# Patient Record
Sex: Male | Born: 1940 | Race: White | Hispanic: No | Marital: Married | Smoking: Former smoker
Health system: Southern US, Community
[De-identification: ages and names within clinical notes are randomized; demographics above are authoritative.]

## PROBLEM LIST (undated history)

## (undated) DIAGNOSIS — I4891 Unspecified atrial fibrillation: Secondary | ICD-10-CM

## (undated) DIAGNOSIS — I10 Essential (primary) hypertension: Secondary | ICD-10-CM

## (undated) DIAGNOSIS — I2699 Other pulmonary embolism without acute cor pulmonale: Secondary | ICD-10-CM

## (undated) DIAGNOSIS — E785 Hyperlipidemia, unspecified: Secondary | ICD-10-CM

## (undated) DIAGNOSIS — G4733 Obstructive sleep apnea (adult) (pediatric): Secondary | ICD-10-CM

## (undated) HISTORY — PX: VASECTOMY: SHX75

## (undated) HISTORY — DX: Hyperlipidemia, unspecified: E78.5

## (undated) HISTORY — DX: Morbid (severe) obesity due to excess calories: E66.01

## (undated) HISTORY — DX: Obstructive sleep apnea (adult) (pediatric): G47.33

## (undated) HISTORY — DX: Unspecified atrial fibrillation: I48.91

## (undated) HISTORY — DX: Essential (primary) hypertension: I10

---

## 2005-04-14 ENCOUNTER — Encounter: Admission: RE | Admit: 2005-04-14 | Discharge: 2005-04-14 | Payer: Self-pay | Admitting: Family Medicine

## 2009-01-17 ENCOUNTER — Emergency Department (HOSPITAL_COMMUNITY): Admission: EM | Admit: 2009-01-17 | Discharge: 2009-01-17 | Payer: Self-pay | Admitting: Family Medicine

## 2009-02-12 HISTORY — PX: US ECHOCARDIOGRAPHY: HXRAD669

## 2010-01-24 ENCOUNTER — Ambulatory Visit: Payer: Self-pay | Admitting: Cardiology

## 2010-07-27 ENCOUNTER — Encounter: Payer: Self-pay | Admitting: Cardiology

## 2010-08-03 ENCOUNTER — Telehealth: Payer: Self-pay | Admitting: *Deleted

## 2010-08-03 ENCOUNTER — Ambulatory Visit (INDEPENDENT_AMBULATORY_CARE_PROVIDER_SITE_OTHER): Payer: Medicare Other | Admitting: Cardiology

## 2010-08-03 ENCOUNTER — Encounter: Payer: Self-pay | Admitting: Cardiology

## 2010-08-03 VITALS — BP 118/76 | HR 93 | Ht 69.0 in | Wt 390.6 lb

## 2010-08-03 DIAGNOSIS — I10 Essential (primary) hypertension: Secondary | ICD-10-CM

## 2010-08-03 DIAGNOSIS — I4891 Unspecified atrial fibrillation: Secondary | ICD-10-CM

## 2010-08-03 DIAGNOSIS — E785 Hyperlipidemia, unspecified: Secondary | ICD-10-CM

## 2010-08-03 DIAGNOSIS — E119 Type 2 diabetes mellitus without complications: Secondary | ICD-10-CM

## 2010-08-03 MED ORDER — RIVAROXABAN 20 MG PO TABS: 20.0000 mg | ORAL_TABLET | Freq: Every day | ORAL | Status: DC

## 2010-08-03 NOTE — Telephone Encounter (Signed)
Called [facility] for prior auth for Xarelto at [id number]. Spoke w/ Chrisi, she approved and was sent to Pharm., called HT and told to resubmit.

## 2010-08-03 NOTE — Progress Notes (Signed)
   John Paul Date of Birth: 1940/12/14   History of Present Illness: John Paul is seen today for followup. He elected not to further consider bariatric surgery. He has restricted his carbohydrate intake and has lost 25 pounds since his last visit. He is feeling well. He states that his dyspnea is about the same. He has had no significant lower extremity edema. He denies any palpitations or dizziness. He has had no chest pain.  Current Outpatient Prescriptions on File Prior to Visit  Medication Sig Dispense Refill  . aspirin 81 MG tablet Take 81 mg by mouth daily.        Marland Kitchen atenolol (TENORMIN) 50 MG tablet Take 50 mg by mouth 2 (two) times daily.        . fish oil-omega-3 fatty acids 1000 MG capsule Take 1 g by mouth daily.        Marland Kitchen glyBURIDE (DIABETA) 2.5 MG tablet Take 2.5 mg by mouth daily with breakfast.        . glyBURIDE-metformin (GLUCOVANCE) 2.5-500 MG per tablet Take 1 tablet by mouth 2 (two) times daily with a meal.        . Ibuprofen (ADVIL PO) Take by mouth as needed.        . Multiple Vitamin (MULTIVITAMIN) tablet Take 1 tablet by mouth daily.        . quinapril (ACCUPRIL) 10 MG tablet Take 20 mg by mouth at bedtime.        . simvastatin (ZOCOR) 20 MG tablet Take 20 mg by mouth at bedtime.        . triamterene-hydrochlorothiazide (DYAZIDE) 37.5-25 MG per capsule Take 1 capsule by mouth every morning.          No Known Allergies  Past Medical History  Diagnosis Date  . Diabetes mellitus   . Hypertension   . Hyperlipidemia   . Morbid obesity     WITH HYPOVENTILATION  . OSA (obstructive sleep apnea)   . Atrial fibrillation     Past Surgical History  Procedure Date  . Vasectomy   . US echocardiography 02/12/2009    EF 55-60%    History  Smoking status  . Former Smoker  . Quit date: 07/27/1970  Smokeless tobacco  . Not on file    History  Alcohol Use No    Family History  Problem Relation Age of Onset  . Clotting disorder Father     Review of  Systems: The review of systems is positive for chronic sleep apnea.  All other systems were reviewed and are negative.  Physical Exam: BP 118/76  Pulse 93  Ht 5\' 9"  (1.753 m)  Wt 390 lb 9.6 oz (177.175 kg)  BMI 57.68 kg/m2  SpO2 95% He is an obese white male in no acute distress. He is normocephalic, atraumatic. Pupils are equal round and reactive to light and accommodation. Extraocular movements are full. Oropharynx is clear. He has no JVD or bruits. Lungs are clear. Cardiac exam reveals an irregular rate and rhythm without gallop or murmur. Abdomen is morbidly obese with a large pannus. He has no edema. Pedal pulses are palpable. Skin is warm and dry. He does walk with a cane. LABORATORY DATA: ECG today demonstrates atrial fibrillation with a rate of 98 beats per minute. There is low voltage and left axis deviation. He has poor R-wave progression.  Assessment / Plan:

## 2010-08-03 NOTE — Assessment & Plan Note (Signed)
He has recurrent atrial fibrillation. His rate is controlled on atenolol. He is asymptomatic. He had a prior episode of atrial fibrillation in 2010 that then resolved. He has a [place] score of at least 2. I have recommended long-term anticoagulation. We discussed the current options and have elected to start him on Xarelto 20 mg daily. If this is too expensive we could put him on Coumadin which he was on previously. I recommended that he stop taking aspirin daily. He is going to monitor his pulse rate at home.

## 2010-08-03 NOTE — Assessment & Plan Note (Signed)
I am pleased with his weight loss. I encouraged him to continue with his weight loss efforts.

## 2010-08-03 NOTE — Assessment & Plan Note (Signed)
Blood pressure is well controlled on his current medical regimen.

## 2010-08-03 NOTE — Patient Instructions (Signed)
We will add Xarelto 20 mg daily for anticoagulation.  Stop ASA.  Continue your other medications.  I will see you back again in 6 months.

## 2010-08-04 ENCOUNTER — Encounter: Payer: Self-pay | Admitting: Cardiology

## 2010-08-24 ENCOUNTER — Encounter: Payer: Self-pay | Admitting: Cardiology

## 2011-02-08 ENCOUNTER — Ambulatory Visit (INDEPENDENT_AMBULATORY_CARE_PROVIDER_SITE_OTHER): Payer: Medicare Other | Admitting: Cardiology

## 2011-02-08 ENCOUNTER — Encounter: Payer: Self-pay | Admitting: Cardiology

## 2011-02-08 VITALS — BP 126/76 | HR 68 | Ht 70.0 in | Wt 387.0 lb

## 2011-02-08 DIAGNOSIS — Z7901 Long term (current) use of anticoagulants: Secondary | ICD-10-CM

## 2011-02-08 DIAGNOSIS — I10 Essential (primary) hypertension: Secondary | ICD-10-CM

## 2011-02-08 DIAGNOSIS — I4891 Unspecified atrial fibrillation: Secondary | ICD-10-CM

## 2011-02-08 DIAGNOSIS — E785 Hyperlipidemia, unspecified: Secondary | ICD-10-CM

## 2011-02-08 NOTE — Assessment & Plan Note (Signed)
Continue to focus on weight loss with portion control.

## 2011-02-08 NOTE — Progress Notes (Signed)
   [name] Date of Birth: [birth year]   History of Present Illness: John Paul is seen today for followup. He is doing very well. He has lost an additional 4 pounds. He is mainly restricting his portion sizes. He did see his primary care physician last week and had complete blood work. He reports that his sugar and cholesterol levels were very good. He has had some slight dizziness and has been suffering from some sinus congestion. He has no change in his shortness of breath. He does have some intermittent arthralgias in his hands.  Current Outpatient Prescriptions on File Prior to Visit  Medication Sig Dispense Refill  . atenolol (TENORMIN) 50 MG tablet Take 50 mg by mouth 2 (two) times daily.        . fish oil-omega-3 fatty acids 1000 MG capsule Take 1 g by mouth daily.        Marland Kitchen glyBURIDE-metformin (GLUCOVANCE) 2.5-500 MG per tablet Take 1 tablet by mouth 2 (two) times daily with a meal.        . Ibuprofen (ADVIL PO) Take by mouth as needed.        . Multiple Vitamin (MULTIVITAMIN) tablet Take 1 tablet by mouth daily.        . quinapril (ACCUPRIL) 10 MG tablet Take 20 mg by mouth at bedtime.        . Rivaroxaban (XARELTO) 20 MG TABS Take 20 mg by mouth daily.  30 tablet  6  . simvastatin (ZOCOR) 20 MG tablet Take 20 mg by mouth at bedtime.        . traMADol (ULTRAM) 50 MG tablet       . triamterene-hydrochlorothiazide (DYAZIDE) 37.5-25 MG per capsule Take 1 capsule by mouth every morning.          No Known Allergies  Past Medical History  Diagnosis Date  . Diabetes mellitus   . Hypertension   . Hyperlipidemia   . Morbid obesity     WITH HYPOVENTILATION  . OSA (obstructive sleep apnea)   . Atrial fibrillation     Past Surgical History  Procedure Date  . Vasectomy   . US echocardiography 02/12/2009    EF 55-60%    History  Smoking status  . Former Smoker  . Quit date: 07/27/1970  Smokeless tobacco  . Not on file    History  Alcohol Use No    Family History    Problem Relation Age of Onset  . Clotting disorder Father     Review of Systems: The review of systems is positive for chronic sleep apnea.  All other systems were reviewed and are negative.  Physical Exam: BP 126/76  Pulse 48  Ht 5\' 10"  (1.778 m)  Wt 387 lb (175.542 kg)  BMI 55.53 kg/m2 He is an obese white male in no acute distress. He is normocephalic, atraumatic. Pupils are equal round and reactive to light and accommodation. Extraocular movements are full. Oropharynx is clear. He has no JVD or bruits. Lungs are clear. Cardiac exam reveals an irregular rate and rhythm without gallop or murmur. Abdomen is morbidly obese with a large pannus. He has no edema. Pedal pulses are palpable. Skin is warm and dry. He does walk with a cane. LABORATORY DATA:   Assessment / Plan:

## 2011-02-08 NOTE — Assessment & Plan Note (Signed)
Blood pressure is under excellent control. 

## 2011-02-08 NOTE — Patient Instructions (Signed)
Continue your current therapy  I will see you again in 6 months.   

## 2011-02-08 NOTE — Assessment & Plan Note (Signed)
Rate is well controlled and he is asymptomatic. We will continue with atenolol. He is on chronic anticoagulation with Xarelto without any bleeding problems. I will followup again in 6 months.

## 2011-03-03 ENCOUNTER — Other Ambulatory Visit: Payer: Self-pay | Admitting: Cardiology

## 2011-09-03 ENCOUNTER — Other Ambulatory Visit: Payer: Self-pay | Admitting: Cardiology

## 2011-09-07 ENCOUNTER — Ambulatory Visit (INDEPENDENT_AMBULATORY_CARE_PROVIDER_SITE_OTHER): Payer: Medicare Other | Admitting: Cardiology

## 2011-09-07 ENCOUNTER — Encounter: Payer: Self-pay | Admitting: Cardiology

## 2011-09-07 VITALS — BP 122/64 | HR 77 | Ht 70.0 in | Wt 391.8 lb

## 2011-09-07 DIAGNOSIS — I10 Essential (primary) hypertension: Secondary | ICD-10-CM

## 2011-09-07 DIAGNOSIS — I4891 Unspecified atrial fibrillation: Secondary | ICD-10-CM

## 2011-09-07 MED ORDER — RIVAROXABAN 20 MG PO TABS: 20.0000 mg | ORAL_TABLET | Freq: Every day | ORAL | Status: DC

## 2011-09-07 NOTE — Patient Instructions (Signed)
Continue your current medications  I will see you again in 6 months.   

## 2011-09-07 NOTE — Progress Notes (Signed)
   [name] Date of Birth: January 14, 1941   History of Present Illness: John Paul is seen today for followup. He is doing very well. He still struggles with his weight. He reports that his breathing is about the same. He does note some increase in his heart rate when he walks. He is still very sedentary. He reports good control of his blood sugars and blood pressure. His last A1c was 5.5%  Current Outpatient Prescriptions on File Prior to Visit  Medication Sig Dispense Refill  . atenolol (TENORMIN) 50 MG tablet Take 50 mg by mouth 2 (two) times daily.        . fish oil-omega-3 fatty acids 1000 MG capsule Take 1 g by mouth daily.        Marland Kitchen glyBURIDE-metformin (GLUCOVANCE) 2.5-500 MG per tablet Take 1 tablet by mouth 2 (two) times daily with a meal.        . Multiple Vitamin (MULTIVITAMIN) tablet Take 1 tablet by mouth daily.        . quinapril (ACCUPRIL) 10 MG tablet Take 20 mg by mouth at bedtime.        . simvastatin (ZOCOR) 20 MG tablet Take 20 mg by mouth at bedtime.        . traMADol (ULTRAM) 50 MG tablet       . triamterene-hydrochlorothiazide (DYAZIDE) 37.5-25 MG per capsule Take 1 capsule by mouth every morning.        Marland Kitchen DISCONTD: XARELTO 20 MG TABS TAKE 1 TABLET BY MOUTH DAILY  30 tablet  4    No Known Allergies  Past Medical History  Diagnosis Date  . Diabetes mellitus   . Hypertension   . Hyperlipidemia   . Morbid obesity     WITH HYPOVENTILATION  . OSA (obstructive sleep apnea)   . Atrial fibrillation     Past Surgical History  Procedure Date  . Vasectomy   . US echocardiography 02/12/2009    EF 55-60%    History  Smoking status  . Former Smoker  . Quit date: 07/27/1970  Smokeless tobacco  . Not on file    History  Alcohol Use No    Family History  Problem Relation Age of Onset  . Clotting disorder Father     Review of Systems: The review of systems is positive for chronic sleep apnea.  All other systems were reviewed and are negative.  Physical  Exam: BP 122/64  Pulse 77  Ht 5\' 10"  (1.778 m)  Wt 391 lb 12.8 oz (177.719 kg)  BMI 56.22 kg/m2 He is an obese white male in no acute distress. He is normocephalic, atraumatic. Pupils are equal round and reactive to light and accommodation. Extraocular movements are full. Oropharynx is clear. He has no JVD or bruits. Lungs are clear. Cardiac exam reveals an irregular rate and rhythm without gallop or murmur. Abdomen is morbidly obese with a large pannus. He has no edema. Pedal pulses are palpable. Skin is warm and dry. He does walk with a cane.  LABORATORY DATA:   Assessment / Plan: 1. Atrial fibrillation, permanent. Rate appears to be well controlled. He is on anticoagulation with Xarelto. We will continue on his current therapy.  2. Hypertension, controlled.  3. Morbid obesity. Continue to encourage weight loss.

## 2012-02-15 ENCOUNTER — Other Ambulatory Visit: Payer: Self-pay | Admitting: Cardiology

## 2012-02-15 NOTE — Telephone Encounter (Signed)
Already refilled up to August this year (2014)

## 2012-09-24 ENCOUNTER — Other Ambulatory Visit: Payer: Self-pay | Admitting: Cardiology

## 2012-12-18 ENCOUNTER — Ambulatory Visit (INDEPENDENT_AMBULATORY_CARE_PROVIDER_SITE_OTHER): Payer: Medicare Other | Admitting: Cardiology

## 2012-12-18 ENCOUNTER — Encounter: Payer: Self-pay | Admitting: Cardiology

## 2012-12-18 VITALS — BP 150/76 | HR 83 | Ht 70.0 in | Wt 367.4 lb

## 2012-12-18 DIAGNOSIS — I10 Essential (primary) hypertension: Secondary | ICD-10-CM

## 2012-12-18 DIAGNOSIS — I4891 Unspecified atrial fibrillation: Secondary | ICD-10-CM

## 2012-12-18 DIAGNOSIS — E785 Hyperlipidemia, unspecified: Secondary | ICD-10-CM

## 2012-12-18 DIAGNOSIS — E119 Type 2 diabetes mellitus without complications: Secondary | ICD-10-CM

## 2012-12-18 NOTE — Patient Instructions (Signed)
Continue your current therapy  I will see you in one year   

## 2012-12-18 NOTE — Progress Notes (Signed)
   John Paul Date of Birth: May 12, 1940   History of Present Illness: John Paul is seen today for yearly followup. He has a history of morbid obesity, permanent atrial fibrillation, and hypertension. He is doing very well. He has lost 24 pounds since his last visit here. He states his blood pressure readings and blood sugars at home have been better. He denies any significant palpitations, dizziness, chest pain, or shortness of breath.  Current Outpatient Prescriptions on File Prior to Visit  Medication Sig Dispense Refill  . atenolol (TENORMIN) 50 MG tablet Take 50 mg by mouth 2 (two) times daily.        Marland Kitchen glyBURIDE-metformin (GLUCOVANCE) 2.5-500 MG per tablet Take 1 tablet by mouth 2 (two) times daily with a meal.        . Multiple Vitamin (MULTIVITAMIN) tablet Take 1 tablet by mouth daily.        . quinapril (ACCUPRIL) 10 MG tablet Take 20 mg by mouth at bedtime.        . simvastatin (ZOCOR) 20 MG tablet Take 20 mg by mouth at bedtime.        . traMADol (ULTRAM) 50 MG tablet       . triamterene-hydrochlorothiazide (DYAZIDE) 37.5-25 MG per capsule Take 1 capsule by mouth every morning.        [name] 20 MG TABS tablet TAKE ONE TABLET EACH DAY  30 tablet  3   No current facility-administered medications on file prior to visit.    No Known Allergies  Past Medical History  Diagnosis Date  . Diabetes mellitus   . Hypertension   . Hyperlipidemia   . Morbid obesity     WITH HYPOVENTILATION  . OSA (obstructive sleep apnea)   . Atrial fibrillation     Past Surgical History  Procedure Laterality Date  . Vasectomy    . US echocardiography  02/12/2009    EF 55-60%    History  Smoking status  . Former Smoker  . Quit date: 07/27/1970  Smokeless tobacco  . Not on file    History  Alcohol Use No    Family History  Problem Relation Age of Onset  . Clotting disorder Father     Review of Systems: The review of systems is positive for chronic sleep apnea.  All other systems  were reviewed and are negative.  Physical Exam: BP 150/76  Pulse 83  Ht 5\' 10"  (1.778 m)  Wt 367 lb 6.4 oz (166.652 kg)  BMI 52.72 kg/m2 He is an obese white male in no acute distress. HEENT exam is unremarkable. He has no JVD or bruits. Lungs are clear. Cardiac exam reveals an irregular rate and rhythm without gallop or murmur. Abdomen is morbidly obese with a large pannus. He has no edema. Pedal pulses are palpable. Skin is warm and dry. He does walk with a cane.  LABORATORY DATA: ECG today demonstrates atrial fibrillation with a rate of 83 beats per minute. Occasional PVCs. There is low voltage and poor R wave progression in the precordial leads.  Assessment / Plan: 1. Atrial fibrillation, permanent. Rate appears to be well controlled. He is on anticoagulation with Xarelto. We will continue on his current therapy.  2. Hypertension, controlled.  3. Morbid obesity. Continue to encourage weight loss.

## 2013-04-17 ENCOUNTER — Other Ambulatory Visit: Payer: Self-pay

## 2013-04-17 MED ORDER — RIVAROXABAN 20 MG PO TABS: ORAL_TABLET | ORAL | Status: DC

## 2013-08-09 ENCOUNTER — Other Ambulatory Visit: Payer: Self-pay | Admitting: Cardiology

## 2013-08-11 NOTE — Telephone Encounter (Signed)
Peter M SwazilandJordan, MD at 12/18/2012  6:13 PM  XARELTO 20 MG TABS tablet  TAKE ONE TABLET EACH DAY   30 tablet   3

## 2013-09-19 ENCOUNTER — Telehealth: Payer: Self-pay | Admitting: Cardiology

## 2013-09-19 ENCOUNTER — Telehealth: Payer: Self-pay | Admitting: *Deleted

## 2013-09-19 NOTE — Telephone Encounter (Signed)
Xarelto samples placed at the front desk for pick up. 

## 2013-09-19 NOTE — Telephone Encounter (Signed)
John Paul is calling because John Paul is in the donut hole and they cannot afford the medication, was given on 25 pills . Please call   Thanks

## 2013-09-19 NOTE — Telephone Encounter (Signed)
Spoke with EC and let her know we do not have any Xarelto at this time. She did get 25pills from [address] office this A.M.

## 2013-10-29 ENCOUNTER — Telehealth: Payer: Self-pay | Admitting: Cardiology

## 2013-10-29 MED ORDER — RIVAROXABAN 20 MG PO TABS: 20.0000 mg | ORAL_TABLET | Freq: Every day | ORAL | Status: DC

## 2013-10-29 NOTE — Telephone Encounter (Signed)
Pt would like some samples of Xarelto 20 mg please. °

## 2013-10-29 NOTE — Telephone Encounter (Signed)
Notified patient's wife that samples are at front desk for pick up

## 2013-12-01 ENCOUNTER — Telehealth: Payer: Self-pay | Admitting: Cardiology

## 2013-12-01 MED ORDER — RIVAROXABAN 20 MG PO TABS: 20.0000 mg | ORAL_TABLET | Freq: Every day | ORAL | Status: DC

## 2013-12-01 NOTE — Telephone Encounter (Signed)
Spoke with linda, aware samples at the front desk for pick up

## 2013-12-01 NOTE — Telephone Encounter (Signed)
Mrs. John Paul is asking if [name] can get some samples of Zestrial because he is donut hole . Please call   Thanks

## 2013-12-30 ENCOUNTER — Telehealth: Payer: Self-pay | Admitting: Cardiology

## 2013-12-30 NOTE — Telephone Encounter (Signed)
Returned call to patient spoke to wife xarelto 20 mg samples left at front desk of [facility] office.Advised to keep appointment with Dr.Jordan 01/13/14 at 2:15 pm.

## 2013-12-30 NOTE — Telephone Encounter (Signed)
Pt would like some samples of Xarelto please. °

## 2014-01-13 ENCOUNTER — Ambulatory Visit (INDEPENDENT_AMBULATORY_CARE_PROVIDER_SITE_OTHER): Payer: Medicare HMO | Admitting: Cardiology

## 2014-01-13 ENCOUNTER — Encounter: Payer: Self-pay | Admitting: Cardiology

## 2014-01-13 VITALS — BP 119/73 | HR 73 | Ht 70.0 in | Wt 316.3 lb

## 2014-01-13 DIAGNOSIS — I10 Essential (primary) hypertension: Secondary | ICD-10-CM

## 2014-01-13 DIAGNOSIS — I480 Paroxysmal atrial fibrillation: Secondary | ICD-10-CM

## 2014-01-13 DIAGNOSIS — E785 Hyperlipidemia, unspecified: Secondary | ICD-10-CM

## 2014-01-13 MED ORDER — RIVAROXABAN 20 MG PO TABS: 20.0000 mg | ORAL_TABLET | Freq: Every day | ORAL | Status: AC

## 2014-01-13 NOTE — Patient Instructions (Signed)
Continue your current therapy  I will see you in one year   

## 2014-01-13 NOTE — Progress Notes (Signed)
   John Paul Date of Birth: 1940/03/13   History of Present Illness: John Paul is seen today for yearly followup. He has a history of morbid obesity,  atrial fibrillation, and hypertension. He is doing very well. He has lost an additional 51  pounds over the past year. He states his blood pressure readings and blood sugars at home have been better. Last A1c was 6. He denies any significant palpitations, dizziness, chest pain, or shortness of breath.  Current Outpatient Prescriptions on File Prior to Visit  Medication Sig Dispense Refill  . glyBURIDE-metformin (GLUCOVANCE) 2.5-500 MG per tablet Take 1 tablet by mouth 2 (two) times daily with a meal.      . Multiple Vitamin (MULTIVITAMIN) tablet Take 1 tablet by mouth daily.      . simvastatin (ZOCOR) 20 MG tablet Take 20 mg by mouth at bedtime.      . traMADol (ULTRAM) 50 MG tablet     . triamterene-hydrochlorothiazide (DYAZIDE) 37.5-25 MG per capsule Take 1 capsule by mouth every morning.       No current facility-administered medications on file prior to visit.    No Known Allergies  Past Medical History  Diagnosis Date  . Diabetes mellitus   . Hypertension   . Hyperlipidemia   . Morbid obesity     WITH HYPOVENTILATION  . OSA (obstructive sleep apnea)   . Atrial fibrillation     Past Surgical History  Procedure Laterality Date  . Vasectomy    . [place] echocardiography  02/12/2009    EF 55-60%    History  Smoking status  . Former Smoker  . Quit date: 07/27/1970  Smokeless tobacco  . Not on file    History  Alcohol Use No    Family History  Problem Relation Age of Onset  . Clotting disorder Father     Review of Systems: The review of systems is positive for chronic sleep apnea. 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  All other systems were reviewed and are negative.  Physical Exam: BP 119/73 mmHg  Pulse 73  Ht 5\' 10"  (1.778 m)  Wt 316 lb 4.8 oz (143.473 kg)  BMI 45.38 kg/m2 He is an obese white male in no  acute distress. HEENT exam is unremarkable. He has no JVD or bruits. Lungs are clear. Cardiac exam reveals a rregular rate and rhythm without gallop or murmur. Abdomen is morbidly obese with a large pannus. He has no edema. Pedal pulses are palpable. Skin is warm and dry. He does walk with a cane.  LABORATORY DATA: ECG today demonstrates NSR with a rate of 84 beats per minute.  There is low voltage.  Assessment / Plan: 1. Atrial fibrillation- surprisingly he appears to be in NSR today. Continue rate control therapy.  He is on anticoagulation with Xarelto.   2. Hypertension, controlled.  3. Morbid obesity. Continue to encourage weight loss. Impressive weight loss this year.

## 2014-06-25 ENCOUNTER — Inpatient Hospital Stay (HOSPITAL_COMMUNITY)
Admission: EM | Admit: 2014-06-25 | Discharge: 2014-07-17 | DRG: 871 | Disposition: E | Payer: Medicare HMO | Attending: Internal Medicine | Admitting: Internal Medicine

## 2014-06-25 ENCOUNTER — Other Ambulatory Visit (HOSPITAL_COMMUNITY): Payer: Self-pay

## 2014-06-25 ENCOUNTER — Encounter (HOSPITAL_COMMUNITY): Payer: Self-pay | Admitting: Emergency Medicine

## 2014-06-25 ENCOUNTER — Emergency Department (HOSPITAL_COMMUNITY): Payer: Medicare HMO

## 2014-06-25 DIAGNOSIS — R0602 Shortness of breath: Secondary | ICD-10-CM | POA: Diagnosis not present

## 2014-06-25 DIAGNOSIS — D649 Anemia, unspecified: Secondary | ICD-10-CM | POA: Diagnosis present

## 2014-06-25 DIAGNOSIS — Y95 Nosocomial condition: Secondary | ICD-10-CM | POA: Diagnosis not present

## 2014-06-25 DIAGNOSIS — I2699 Other pulmonary embolism without acute cor pulmonale: Secondary | ICD-10-CM | POA: Diagnosis present

## 2014-06-25 DIAGNOSIS — K5289 Other specified noninfective gastroenteritis and colitis: Secondary | ICD-10-CM | POA: Insufficient documentation

## 2014-06-25 DIAGNOSIS — I959 Hypotension, unspecified: Secondary | ICD-10-CM | POA: Diagnosis present

## 2014-06-25 DIAGNOSIS — B965 Pseudomonas (aeruginosa) (mallei) (pseudomallei) as the cause of diseases classified elsewhere: Secondary | ICD-10-CM | POA: Diagnosis present

## 2014-06-25 DIAGNOSIS — Z9119 Patient's noncompliance with other medical treatment and regimen: Secondary | ICD-10-CM | POA: Diagnosis present

## 2014-06-25 DIAGNOSIS — E876 Hypokalemia: Secondary | ICD-10-CM | POA: Diagnosis not present

## 2014-06-25 DIAGNOSIS — E662 Morbid (severe) obesity with alveolar hypoventilation: Secondary | ICD-10-CM | POA: Diagnosis present

## 2014-06-25 DIAGNOSIS — Z6841 Body Mass Index (BMI) 40.0 and over, adult: Secondary | ICD-10-CM

## 2014-06-25 DIAGNOSIS — I9589 Other hypotension: Secondary | ICD-10-CM

## 2014-06-25 DIAGNOSIS — J9621 Acute and chronic respiratory failure with hypoxia: Secondary | ICD-10-CM | POA: Diagnosis not present

## 2014-06-25 DIAGNOSIS — I480 Paroxysmal atrial fibrillation: Secondary | ICD-10-CM | POA: Diagnosis present

## 2014-06-25 DIAGNOSIS — N390 Urinary tract infection, site not specified: Secondary | ICD-10-CM | POA: Diagnosis present

## 2014-06-25 DIAGNOSIS — E873 Alkalosis: Secondary | ICD-10-CM

## 2014-06-25 DIAGNOSIS — L89159 Pressure ulcer of sacral region, unspecified stage: Secondary | ICD-10-CM | POA: Diagnosis present

## 2014-06-25 DIAGNOSIS — G4733 Obstructive sleep apnea (adult) (pediatric): Secondary | ICD-10-CM | POA: Diagnosis present

## 2014-06-25 DIAGNOSIS — Z515 Encounter for palliative care: Secondary | ICD-10-CM

## 2014-06-25 DIAGNOSIS — D638 Anemia in other chronic diseases classified elsewhere: Secondary | ICD-10-CM | POA: Diagnosis present

## 2014-06-25 DIAGNOSIS — M130 Polyarthritis, unspecified: Secondary | ICD-10-CM | POA: Insufficient documentation

## 2014-06-25 DIAGNOSIS — R571 Hypovolemic shock: Secondary | ICD-10-CM | POA: Diagnosis present

## 2014-06-25 DIAGNOSIS — F419 Anxiety disorder, unspecified: Secondary | ICD-10-CM | POA: Diagnosis present

## 2014-06-25 DIAGNOSIS — D72829 Elevated white blood cell count, unspecified: Secondary | ICD-10-CM | POA: Diagnosis not present

## 2014-06-25 DIAGNOSIS — E861 Hypovolemia: Secondary | ICD-10-CM | POA: Diagnosis present

## 2014-06-25 DIAGNOSIS — E8809 Other disorders of plasma-protein metabolism, not elsewhere classified: Secondary | ICD-10-CM | POA: Diagnosis not present

## 2014-06-25 DIAGNOSIS — E43 Unspecified severe protein-calorie malnutrition: Secondary | ICD-10-CM | POA: Diagnosis not present

## 2014-06-25 DIAGNOSIS — I4892 Unspecified atrial flutter: Secondary | ICD-10-CM | POA: Diagnosis present

## 2014-06-25 DIAGNOSIS — I872 Venous insufficiency (chronic) (peripheral): Secondary | ICD-10-CM | POA: Diagnosis present

## 2014-06-25 DIAGNOSIS — L89009 Pressure ulcer of unspecified elbow, unspecified stage: Secondary | ICD-10-CM | POA: Diagnosis present

## 2014-06-25 DIAGNOSIS — E860 Dehydration: Secondary | ICD-10-CM | POA: Diagnosis present

## 2014-06-25 DIAGNOSIS — E872 Acidosis, unspecified: Secondary | ICD-10-CM | POA: Diagnosis present

## 2014-06-25 DIAGNOSIS — N179 Acute kidney failure, unspecified: Secondary | ICD-10-CM | POA: Diagnosis present

## 2014-06-25 DIAGNOSIS — R918 Other nonspecific abnormal finding of lung field: Secondary | ICD-10-CM

## 2014-06-25 DIAGNOSIS — I5021 Acute systolic (congestive) heart failure: Secondary | ICD-10-CM | POA: Diagnosis not present

## 2014-06-25 DIAGNOSIS — E871 Hypo-osmolality and hyponatremia: Secondary | ICD-10-CM | POA: Diagnosis present

## 2014-06-25 DIAGNOSIS — Z87891 Personal history of nicotine dependence: Secondary | ICD-10-CM

## 2014-06-25 DIAGNOSIS — Y92009 Unspecified place in unspecified non-institutional (private) residence as the place of occurrence of the external cause: Secondary | ICD-10-CM

## 2014-06-25 DIAGNOSIS — K5900 Constipation, unspecified: Secondary | ICD-10-CM | POA: Diagnosis present

## 2014-06-25 DIAGNOSIS — J189 Pneumonia, unspecified organism: Secondary | ICD-10-CM | POA: Diagnosis not present

## 2014-06-25 DIAGNOSIS — Z7901 Long term (current) use of anticoagulants: Secondary | ICD-10-CM | POA: Diagnosis not present

## 2014-06-25 DIAGNOSIS — I4821 Permanent atrial fibrillation: Secondary | ICD-10-CM | POA: Diagnosis present

## 2014-06-25 DIAGNOSIS — Z86711 Personal history of pulmonary embolism: Secondary | ICD-10-CM

## 2014-06-25 DIAGNOSIS — J810 Acute pulmonary edema: Secondary | ICD-10-CM

## 2014-06-25 DIAGNOSIS — R0603 Acute respiratory distress: Secondary | ICD-10-CM

## 2014-06-25 DIAGNOSIS — E874 Mixed disorder of acid-base balance: Secondary | ICD-10-CM | POA: Diagnosis not present

## 2014-06-25 DIAGNOSIS — E785 Hyperlipidemia, unspecified: Secondary | ICD-10-CM | POA: Diagnosis present

## 2014-06-25 DIAGNOSIS — J80 Acute respiratory distress syndrome: Secondary | ICD-10-CM | POA: Diagnosis present

## 2014-06-25 DIAGNOSIS — I10 Essential (primary) hypertension: Secondary | ICD-10-CM | POA: Diagnosis present

## 2014-06-25 DIAGNOSIS — A419 Sepsis, unspecified organism: Principal | ICD-10-CM | POA: Diagnosis present

## 2014-06-25 DIAGNOSIS — F329 Major depressive disorder, single episode, unspecified: Secondary | ICD-10-CM | POA: Diagnosis present

## 2014-06-25 DIAGNOSIS — R410 Disorientation, unspecified: Secondary | ICD-10-CM | POA: Diagnosis not present

## 2014-06-25 DIAGNOSIS — I639 Cerebral infarction, unspecified: Secondary | ICD-10-CM

## 2014-06-25 DIAGNOSIS — R197 Diarrhea, unspecified: Secondary | ICD-10-CM | POA: Diagnosis present

## 2014-06-25 DIAGNOSIS — Z66 Do not resuscitate: Secondary | ICD-10-CM | POA: Diagnosis present

## 2014-06-25 DIAGNOSIS — D689 Coagulation defect, unspecified: Secondary | ICD-10-CM | POA: Diagnosis present

## 2014-06-25 DIAGNOSIS — M609 Myositis, unspecified: Secondary | ICD-10-CM | POA: Diagnosis not present

## 2014-06-25 DIAGNOSIS — E1140 Type 2 diabetes mellitus with diabetic neuropathy, unspecified: Secondary | ICD-10-CM | POA: Diagnosis present

## 2014-06-25 DIAGNOSIS — L97409 Non-pressure chronic ulcer of unspecified heel and midfoot with unspecified severity: Secondary | ICD-10-CM | POA: Diagnosis not present

## 2014-06-25 DIAGNOSIS — L8990 Pressure ulcer of unspecified site, unspecified stage: Secondary | ICD-10-CM | POA: Diagnosis present

## 2014-06-25 DIAGNOSIS — J984 Other disorders of lung: Secondary | ICD-10-CM | POA: Diagnosis present

## 2014-06-25 DIAGNOSIS — B370 Candidal stomatitis: Secondary | ICD-10-CM | POA: Diagnosis present

## 2014-06-25 DIAGNOSIS — R6521 Severe sepsis with septic shock: Secondary | ICD-10-CM | POA: Diagnosis present

## 2014-06-25 DIAGNOSIS — R0989 Other specified symptoms and signs involving the circulatory and respiratory systems: Secondary | ICD-10-CM | POA: Diagnosis present

## 2014-06-25 DIAGNOSIS — I509 Heart failure, unspecified: Secondary | ICD-10-CM | POA: Diagnosis present

## 2014-06-25 DIAGNOSIS — K529 Noninfective gastroenteritis and colitis, unspecified: Secondary | ICD-10-CM | POA: Diagnosis not present

## 2014-06-25 DIAGNOSIS — I4891 Unspecified atrial fibrillation: Secondary | ICD-10-CM | POA: Diagnosis present

## 2014-06-25 DIAGNOSIS — IMO0001 Reserved for inherently not codable concepts without codable children: Secondary | ICD-10-CM

## 2014-06-25 DIAGNOSIS — I482 Chronic atrial fibrillation: Secondary | ICD-10-CM | POA: Diagnosis present

## 2014-06-25 DIAGNOSIS — M791 Myalgia: Secondary | ICD-10-CM | POA: Diagnosis not present

## 2014-06-25 DIAGNOSIS — E119 Type 2 diabetes mellitus without complications: Secondary | ICD-10-CM

## 2014-06-25 DIAGNOSIS — M1990 Unspecified osteoarthritis, unspecified site: Secondary | ICD-10-CM | POA: Diagnosis present

## 2014-06-25 DIAGNOSIS — E875 Hyperkalemia: Secondary | ICD-10-CM | POA: Diagnosis present

## 2014-06-25 DIAGNOSIS — E8881 Metabolic syndrome: Secondary | ICD-10-CM | POA: Diagnosis not present

## 2014-06-25 DIAGNOSIS — R509 Fever, unspecified: Secondary | ICD-10-CM | POA: Diagnosis not present

## 2014-06-25 DIAGNOSIS — A084 Viral intestinal infection, unspecified: Secondary | ICD-10-CM | POA: Diagnosis present

## 2014-06-25 DIAGNOSIS — I50811 Acute right heart failure: Secondary | ICD-10-CM

## 2014-06-25 DIAGNOSIS — I739 Peripheral vascular disease, unspecified: Secondary | ICD-10-CM | POA: Diagnosis present

## 2014-06-25 DIAGNOSIS — R52 Pain, unspecified: Secondary | ICD-10-CM

## 2014-06-25 HISTORY — DX: Other pulmonary embolism without acute cor pulmonale: I26.99

## 2014-06-25 LAB — CBC
HEMATOCRIT: 43.3 % (ref 39.0–52.0)
HEMOGLOBIN: 14.2 g/dL (ref 13.0–17.0)
MCH: 31 pg (ref 26.0–34.0)
MCHC: 32.8 g/dL (ref 30.0–36.0)
MCV: 94.5 fL (ref 78.0–100.0)
Platelets: 292 10*3/uL (ref 150–400)
RBC: 4.58 MIL/uL (ref 4.22–5.81)
RDW: 14.1 % (ref 11.5–15.5)
WBC: 25.2 10*3/uL — AB (ref 4.0–10.5)

## 2014-06-25 LAB — PROTIME-INR
INR: 3.7 — ABNORMAL HIGH (ref 0.00–1.49)
Prothrombin Time: 35.8 seconds — ABNORMAL HIGH (ref 11.6–15.2)

## 2014-06-25 LAB — HEPATIC FUNCTION PANEL
ALK PHOS: 71 U/L (ref 38–126)
ALT: 15 U/L — ABNORMAL LOW (ref 17–63)
AST: 18 U/L (ref 15–41)
Albumin: 2.8 g/dL — ABNORMAL LOW (ref 3.5–5.0)
BILIRUBIN TOTAL: 0.8 mg/dL (ref 0.3–1.2)
Bilirubin, Direct: 0.2 mg/dL (ref 0.1–0.5)
Indirect Bilirubin: 0.6 mg/dL (ref 0.3–0.9)
TOTAL PROTEIN: 6.1 g/dL — AB (ref 6.5–8.1)

## 2014-06-25 LAB — URINE MICROSCOPIC-ADD ON

## 2014-06-25 LAB — URINALYSIS, ROUTINE W REFLEX MICROSCOPIC
Bilirubin Urine: NEGATIVE
Glucose, UA: NEGATIVE mg/dL
Ketones, ur: NEGATIVE mg/dL
LEUKOCYTES UA: NEGATIVE
Nitrite: NEGATIVE
Protein, ur: NEGATIVE mg/dL
Specific Gravity, Urine: 1.019 (ref 1.005–1.030)
UROBILINOGEN UA: 1 mg/dL (ref 0.0–1.0)
pH: 5 (ref 5.0–8.0)

## 2014-06-25 LAB — BASIC METABOLIC PANEL
Anion gap: 11 (ref 5–15)
BUN: 47 mg/dL — ABNORMAL HIGH (ref 6–20)
CO2: 20 mmol/L — ABNORMAL LOW (ref 22–32)
CREATININE: 2 mg/dL — AB (ref 0.61–1.24)
Calcium: 8 mg/dL — ABNORMAL LOW (ref 8.9–10.3)
Chloride: 107 mmol/L (ref 101–111)
GFR, EST AFRICAN AMERICAN: 37 mL/min — AB (ref >60)
GFR, EST NON AFRICAN AMERICAN: 32 mL/min — AB (ref >60)
Glucose, Bld: 131 mg/dL — ABNORMAL HIGH (ref 65–99)
Potassium: 4.5 mmol/L (ref 3.5–5.1)
SODIUM: 138 mmol/L (ref 135–145)

## 2014-06-25 LAB — I-STAT TROPONIN, ED: TROPONIN I, POC: 0 ng/mL (ref 0.00–0.08)

## 2014-06-25 LAB — TROPONIN I: Troponin I: <0.03 ng/mL (ref <0.031)

## 2014-06-25 LAB — I-STAT CHEM 8, ED
BUN: 64 mg/dL — ABNORMAL HIGH (ref 6–20)
CREATININE: 2 mg/dL — AB (ref 0.61–1.24)
Calcium, Ion: 1.05 mmol/L — ABNORMAL LOW (ref 1.13–1.30)
Chloride: 106 mmol/L (ref 101–111)
Glucose, Bld: 168 mg/dL — ABNORMAL HIGH (ref 65–99)
HEMATOCRIT: 48 % (ref 39.0–52.0)
HEMOGLOBIN: 16.3 g/dL (ref 13.0–17.0)
Potassium: 5.9 mmol/L — ABNORMAL HIGH (ref 3.5–5.1)
SODIUM: 134 mmol/L — AB (ref 135–145)
TCO2: 17 mmol/L (ref 0–100)

## 2014-06-25 LAB — BRAIN NATRIURETIC PEPTIDE: B Natriuretic Peptide: 92.8 pg/mL (ref 0.0–100.0)

## 2014-06-25 LAB — I-STAT CG4 LACTIC ACID, ED
LACTIC ACID, VENOUS: 1.7 mmol/L (ref 0.5–2.0)
Lactic Acid, Venous: 4.13 mmol/L (ref 0.5–2.0)

## 2014-06-25 LAB — CBG MONITORING, ED: Glucose-Capillary: 157 mg/dL — ABNORMAL HIGH (ref 65–99)

## 2014-06-25 LAB — AMMONIA: Ammonia: 11 umol/L (ref 9–35)

## 2014-06-25 LAB — PROCALCITONIN: PROCALCITONIN: 1.32 ng/mL

## 2014-06-25 LAB — APTT: aPTT: 38 seconds — ABNORMAL HIGH (ref 24–37)

## 2014-06-25 MED ORDER — SODIUM CHLORIDE 0.9 % IV SOLN
INTRAVENOUS | Status: DC
  Administered 2014-06-25: 150 mL via INTRAVENOUS
  Administered 2014-06-26: 11:00:00 via INTRAVENOUS
  Administered 2014-06-27: 75 mL/h via INTRAVENOUS

## 2014-06-25 MED ORDER — INSULIN ASPART 100 UNIT/ML ~~LOC~~ SOLN
0.0000 [IU] | Freq: Three times a day (TID) | SUBCUTANEOUS | Status: DC
  Administered 2014-06-26 – 2014-07-04 (×19): 3 [IU] via SUBCUTANEOUS
  Administered 2014-07-05: 4 [IU] via SUBCUTANEOUS
  Administered 2014-07-05 – 2014-07-06 (×3): 3 [IU] via SUBCUTANEOUS
  Administered 2014-07-07: 4 [IU] via SUBCUTANEOUS
  Administered 2014-07-07 – 2014-07-08 (×2): 3 [IU] via SUBCUTANEOUS
  Administered 2014-07-10: 4 [IU] via SUBCUTANEOUS
  Administered 2014-07-11 – 2014-07-15 (×5): 3 [IU] via SUBCUTANEOUS

## 2014-06-25 MED ORDER — VANCOMYCIN HCL IN DEXTROSE 1-5 GM/200ML-% IV SOLN
1000.0000 mg | Freq: Once | INTRAVENOUS | Status: AC
Start: 2014-06-25 — End: 2014-06-25
  Administered 2014-06-25: 1000 mg via INTRAVENOUS
  Filled 2014-06-25: qty 200

## 2014-06-25 MED ORDER — RIVAROXABAN 20 MG PO TABS: 20.0000 mg | ORAL_TABLET | Freq: Every day | ORAL | Status: DC

## 2014-06-25 MED ORDER — INSULIN ASPART 100 UNIT/ML ~~LOC~~ SOLN
0.0000 [IU] | Freq: Every day | SUBCUTANEOUS | Status: DC
  Administered 2014-06-30: 4 [IU] via SUBCUTANEOUS

## 2014-06-25 MED ORDER — SODIUM CHLORIDE 0.9 % IV BOLUS (SEPSIS)
1000.0000 mL | Freq: Once | INTRAVENOUS | Status: AC
Start: 2014-06-25 — End: 2014-06-25
  Administered 2014-06-25: 1000 mL via INTRAVENOUS

## 2014-06-25 MED ORDER — VANCOMYCIN HCL IN DEXTROSE 1-5 GM/200ML-% IV SOLN
1000.0000 mg | Freq: Once | INTRAVENOUS | Status: DC
  Administered 2014-06-25: 1000 mg via INTRAVENOUS
  Filled 2014-06-25: qty 200

## 2014-06-25 MED ORDER — PIPERACILLIN-TAZOBACTAM 3.375 G IVPB
3.3750 g | Freq: Once | INTRAVENOUS | Status: AC
  Administered 2014-06-25: 3.375 g via INTRAVENOUS
  Filled 2014-06-25: qty 50

## 2014-06-25 MED ORDER — SODIUM CHLORIDE 0.9 % IV BOLUS (SEPSIS): 1000.0000 mL | Freq: Once | INTRAVENOUS | Status: DC

## 2014-06-25 MED ORDER — METRONIDAZOLE IN NACL 5-0.79 MG/ML-% IV SOLN
500.0000 mg | Freq: Once | INTRAVENOUS | Status: AC
  Administered 2014-06-25: 500 mg via INTRAVENOUS
  Filled 2014-06-25: qty 100

## 2014-06-25 MED ORDER — PIPERACILLIN-TAZOBACTAM 3.375 G IVPB
3.3750 g | Freq: Three times a day (TID) | INTRAVENOUS | Status: DC
  Administered 2014-06-26 (×2): 3.375 g via INTRAVENOUS
  Filled 2014-06-25 (×3): qty 50

## 2014-06-25 MED ORDER — NYSTATIN 100000 UNIT/ML MT SUSP
5.0000 mL | Freq: Four times a day (QID) | OROMUCOSAL | Status: DC
  Administered 2014-06-25 – 2014-07-10 (×49): 500000 [IU] via ORAL
  Filled 2014-06-25 (×49): qty 5

## 2014-06-25 MED ORDER — METRONIDAZOLE 500 MG PO TABS
500.0000 mg | ORAL_TABLET | Freq: Once | ORAL | Status: AC
  Administered 2014-06-25: 500 mg via ORAL
  Filled 2014-06-25: qty 1

## 2014-06-25 MED ORDER — VANCOMYCIN HCL 10 G IV SOLR
1750.0000 mg | INTRAVENOUS | Status: DC
  Filled 2014-06-25 (×2): qty 1750

## 2014-06-25 MED ORDER — SODIUM CHLORIDE 0.9 % IV SOLN
1.0000 g | Freq: Once | INTRAVENOUS | Status: AC
  Administered 2014-06-25: 1 g via INTRAVENOUS
  Filled 2014-06-25 (×2): qty 10

## 2014-06-25 MED ORDER — METRONIDAZOLE IN NACL 5-0.79 MG/ML-% IV SOLN
500.0000 mg | Freq: Three times a day (TID) | INTRAVENOUS | Status: DC
  Administered 2014-06-26 – 2014-06-29 (×10): 500 mg via INTRAVENOUS
  Filled 2014-06-25 (×10): qty 100

## 2014-06-25 MED ORDER — HEPARIN SODIUM (PORCINE) 5000 UNIT/ML IJ SOLN: 5000.0000 [IU] | Freq: Three times a day (TID) | INTRAMUSCULAR | Status: DC

## 2014-06-25 MED ORDER — SODIUM CHLORIDE 0.9 % IV BOLUS (SEPSIS)
250.0000 mL | Freq: Once | INTRAVENOUS | Status: AC
Start: 2014-06-25 — End: 2014-06-25
  Administered 2014-06-25: 250 mL via INTRAVENOUS

## 2014-06-25 MED ORDER — SODIUM CHLORIDE 0.9 % IV BOLUS (SEPSIS)
1000.0000 mL | Freq: Once | INTRAVENOUS | Status: AC
  Administered 2014-06-25: 1000 mL via INTRAVENOUS

## 2014-06-25 MED ORDER — SODIUM CHLORIDE 0.9 % IV SOLN
250.0000 mL | INTRAVENOUS | Status: DC | PRN
  Administered 2014-06-28 – 2014-07-06 (×3): 250 mL via INTRAVENOUS

## 2014-06-25 NOTE — Progress Notes (Signed)
ANTICOAGULATION CONSULT NOTE - Initial Consult  Pharmacy Consult for IV heparin Indication: atrial fibrillation  No Known Allergies  Patient Measurements: Weight: (!) 320 lb (145.151 kg) Heparin Dosing Weight: 107.4 kg  Vital Signs: Temp: 97.8 F (36.6 C) (06/09 1500) Temp Source: Oral (06/09 1500) BP: 117/94 mmHg (06/09 2000) Pulse Rate: 111 (06/09 2000)  Labs:  Recent Labs  06/26/2014 1540 07/08/2014 1545 07/07/2014 1546 06/26/2014 1655  HGB 16.3 14.2  --   --   HCT 48.0 43.3  --   --   PLT  --  292  --   --   APTT  --   --  38*  --   LABPROT  --   --  35.8*  --   INR  --   --  3.70*  --   CREATININE 2.00*  --   --  2.00*    Estimated Creatinine Clearance: 48.8 mL/min (by C-G formula based on Cr of 2).   Medical History: Past Medical History  Diagnosis Date  . Diabetes mellitus   . Hypertension   . Hyperlipidemia   . Morbid obesity     WITH HYPOVENTILATION  . OSA (obstructive sleep apnea)   . Atrial fibrillation     Medications:  Scheduled:  . [START ON 06/26/2014] insulin aspart  0-20 Units Subcutaneous TID WC  . insulin aspart  0-5 Units Subcutaneous QHS  . nystatin  5 mL Oral QID   Infusions:  . sodium chloride    . sodium chloride    . calcium gluconate    . metronidazole 500 mg (07/11/2014 1923)  . piperacillin-tazobactam (ZOSYN)  IV Stopped (07/05/2014 2215)  . sodium chloride      Assessment: 74 yo from home presents to ER with SOB, diarrhea, and cough. PMH includes morbidly obese, DM, HLP, OSA and hx Afib on Xarelto. To start vancomycin and Zosyn for rule out sepsis and Flagyl for rule out diarrhea. Note also per CCM note, to stop Xarelto for now and transition to IV heparin   Note that IV heparin will not start until 24hr after last Xarelto dose, which was 6/9 at 1030am  INR falsely elevated due to Xarelto  Note patient with elevated SCr and normalized CrCl of 34 ml/min/1.84m2. If CrCl < 30 ml/min and on Xarelto for Afib indication, appropriate  dosing would be reduced to 15mg  once daily  Baseline CBC WNL  Goal of Therapy:  Heparin level 0.3-0.7 units/ml aPTT 66-102 seconds Monitor platelets by anticoagulation protocol: Yes   Plan:  1) As stated above, last Xarelto dose was today at 1030am, so IV heparin will not start until tomorrow 6/10 at around same time 2) To obtain baseline aPTT and heparin level with AM labs to determine how IV heparin will be monitored with patient previously on Xarelto PTA 3) Note, no orders placed for IV heparin - pharmacy will order heparin tomorrow closer to time when its supposed to be started  Hessie Knows, PharmD, BCPS Pager [phone] 07/14/2014 8:21 PM

## 2014-06-25 NOTE — ED Notes (Signed)
Pt fell over the weekend and EMS had to be called out to help get the pt up.  Pt been having diarrhea since Saturday and that is concerning to family and to why he is brought in today.  Pt also c/o shob with productive cough with brownish phlegm.  Pt also c/o abd pain.

## 2014-06-25 NOTE — Progress Notes (Signed)
CSW met with patient at bedside. Family was present. Patient confirms that he comes from home. He states that he lives in [place] with is wife and daughter. Also, patient confirms that he presents to [facility] due to falling. According to grandson, the pt feel due to being light headed.  Patient informed CSW that he completes his ADL's independently. Also, he states that he does not fall often.  Patient informed CSW that he has a good support system. Patient stated that he is not interested in a facility at this time.  Wife/Linda [phone]  Willette Brace [phone] ED CSW 06/27/2014 4:24 PM

## 2014-06-25 NOTE — ED Provider Notes (Signed)
CSN: [id number]     Arrival date & time 07-02-14  1447 History   First MD Initiated Contact with Patient 02-Jul-2014 1506     Chief Complaint  Patient presents with  . Shortness of Breath  . Diarrhea  . Cough     (Consider location/radiation/quality/duration/timing/severity/associated sxs/prior Treatment) HPI Comments: The patient is a 74 year old male, he is morbidly obese, he has a history of diabetes, hypertension, hyperlipidemia and obstructive sleep apnea as well as atrial fibrillation on Xarelto.  Information was obtained from his wife, the medical record, he presents from home by private vehicle after his wife states that he has had diarrhea for approximately 5 days. He had initially been drinking excessive amounts of water when he became constipated, then he started to take anticonstipation pills, approximately the time that he started getting diarrhea he also began to have altered mental status which has been progressive and gradually worsening. He has had a fall yesterday which required to the paramedics to come and help him get up, he also has been complaining of a cough and decreased appetite taking only a small amount of food today. The patient is unable answer my questions appropriately as he has decreased memory, level V caveat applies.  Patient is a 74 y.o. male presenting with shortness of breath, diarrhea, and cough. The history is provided by the patient.  Shortness of Breath Associated symptoms: cough   Diarrhea Cough Associated symptoms: shortness of breath     Past Medical History  Diagnosis Date  . Diabetes mellitus   . Hypertension   . Hyperlipidemia   . Morbid obesity     WITH HYPOVENTILATION  . OSA (obstructive sleep apnea)   . Atrial fibrillation    Past Surgical History  Procedure Laterality Date  . Vasectomy    . US echocardiography  02/12/2009    EF 55-60%   Family History  Problem Relation Age of Onset  . Clotting disorder Father    History   Substance Use Topics  . Smoking status: Former Smoker    Quit date: 07/27/1970  . Smokeless tobacco: Not on file  . Alcohol Use: No    Review of Systems  Unable to perform ROS: Mental status change  Respiratory: Positive for cough and shortness of breath.   Gastrointestinal: Positive for diarrhea.      Allergies  Review of patient's allergies indicates no known allergies.  Home Medications   Prior to Admission medications   Medication Sig Start Date End Date Taking? Authorizing Provider  Alcohol Swabs (B-D SINGLE USE SWABS REGULAR) PADS  06/19/14  Yes Historical Provider, MD  atenolol (TENORMIN) 100 MG tablet  01/02/14  Yes Historical Provider, MD  docusate sodium (COLACE) 100 MG capsule Take 100 mg by mouth 2 (two) times daily.   Yes Historical Provider, MD  metFORMIN (GLUCOPHAGE-XR) 500 MG 24 hr tablet Take 500 mg by mouth 2 (two) times daily. 01/15/14  Yes Historical Provider, MD  Multiple Vitamin (MULTIVITAMIN) tablet Take 1 tablet by mouth daily.     Yes Historical Provider, MD  OVER THE COUNTER MEDICATION 1 tablet daily as needed (Sudafed- strength unknown).   Yes Historical Provider, MD  quinapril (ACCUPRIL) 20 MG tablet  01/02/14  Yes Historical Provider, MD  rivaroxaban (XARELTO) 20 MG TABS tablet Take 1 tablet (20 mg total) by mouth daily. 01/13/14  Yes Peter M [place], MD  simvastatin (ZOCOR) 20 MG tablet Take 20 mg by mouth at bedtime.     Yes Historical Provider, MD  traMADol (ULTRAM) 50 MG tablet  07/30/10  Yes Historical Provider, MD  triamterene-hydrochlorothiazide (DYAZIDE) 37.5-25 MG per capsule Take 1 capsule by mouth every morning.     Yes Historical Provider, MD  TRUE METRIX BLOOD GLUCOSE TEST test strip  06/19/14  Yes Historical Provider, MD  glyBURIDE-metformin (GLUCOVANCE) 2.5-500 MG per tablet Take 1 tablet by mouth 2 (two) times daily with a meal.      Historical Provider, MD   BP 89/58 mmHg  Pulse 121  Temp(Src) 97.8 F (36.6 C) (Oral)  Resp 19  SpO2  98% Physical Exam  Constitutional: He appears well-developed and well-nourished. No distress.  HENT:  Head: Normocephalic and atraumatic.  Mucous membranes excessively dehydrated, the presence of thrush diffusely in the mouth, buccal mucosa, tongue and oropharynx  Eyes: Conjunctivae and EOM are normal. Pupils are equal, round, and reactive to light. Right eye exhibits no discharge. Left eye exhibits no discharge. No scleral icterus.  Neck: Normal range of motion. Neck supple. No JVD present. No thyromegaly present.  Cardiovascular: Regular rhythm, normal heart sounds and intact distal pulses.  Exam reveals no gallop and no friction rub.   No murmur heard. Tachycardia, weak peripheral pulses  Pulmonary/Chest: Effort normal and breath sounds normal. No respiratory distress. He has no wheezes. He has no rales.  Abdominal: Soft. Bowel sounds are normal. He exhibits no distension and no mass. There is no tenderness.  Minimal abdominal tenderness, morbidly obese, no guarding, no peritoneal signs  Musculoskeletal: Normal range of motion. He exhibits edema ( Mild bilateral lower extremity edema, morbidly obese). He exhibits no tenderness.  Lymphadenopathy:    He has no cervical adenopathy.  Neurological: He is alert. Coordination normal.  The patient is alert, he has normal correlation, he is diffusely weak, he is unable to sit up in the bed without significant assistance, he has significant difficulty lifting either of his legs off the bed. His speech is clear, he has confusion  Skin: Skin is warm and dry. No rash noted. No erythema.  Psychiatric: He has a normal mood and affect. His behavior is normal.  Nursing note and vitals reviewed.   ED Course  Procedures (including critical care time) Labs Review Labs Reviewed  CBC - Abnormal; Notable for the following:    WBC 25.2 (*)    All other components within normal limits  PROTIME-INR - Abnormal; Notable for the following:    Prothrombin Time  35.8 (*)    INR 3.70 (*)    All other components within normal limits  APTT - Abnormal; Notable for the following:    aPTT 38 (*)    All other components within normal limits  HEPATIC FUNCTION PANEL - Abnormal; Notable for the following:    Total Protein 6.1 (*)    Albumin 2.8 (*)    ALT 15 (*)    All other components within normal limits  BASIC METABOLIC PANEL - Abnormal; Notable for the following:    CO2 20 (*)    Glucose, Bld 131 (*)    BUN 47 (*)    Creatinine, Ser 2.00 (*)    Calcium 8.0 (*)    GFR calc non Af Amer 32 (*)    GFR calc Af Amer 37 (*)    All other components within normal limits  I-STAT CG4 LACTIC ACID, ED - Abnormal; Notable for the following:    Lactic Acid, Venous 4.13 (*)    All other components within normal limits  I-STAT CHEM 8, ED -  Abnormal; Notable for the following:    Sodium 134 (*)    Potassium 5.9 (*)    BUN 64 (*)    Creatinine, Ser 2.00 (*)    Glucose, Bld 168 (*)    Calcium, Ion 1.05 (*)    All other components within normal limits  CBG MONITORING, ED - Abnormal; Notable for the following:    Glucose-Capillary 157 (*)    All other components within normal limits  CULTURE, BLOOD (ROUTINE X 2)  CULTURE, BLOOD (ROUTINE X 2)  URINE CULTURE  CLOSTRIDIUM DIFFICILE BY PCR (NOT AT [facility])  BRAIN NATRIURETIC PEPTIDE  AMMONIA  URINALYSIS, ROUTINE W REFLEX MICROSCOPIC (NOT AT [facility])  Rosezena Sensor, ED  I-STAT CG4 LACTIC ACID, ED    Imaging Review Dg Chest Port 1 View  07/04/2014   CLINICAL DATA:  Short of breath.  Diarrhea.  Cough.  EXAM: PORTABLE CHEST - 1 VIEW  COMPARISON:  04/14/2005.  FINDINGS: Chronic cardiomegaly. No airspace disease or pleural effusion. Monitoring leads project over the chest. Basilar atelectasis.  IMPRESSION: Chronic cardiomegaly without acute cardiopulmonary disease.   Electronically Signed   By: Andreas Newport M.D.   On: 06/29/2014 15:29     EKG Interpretation   Date/Time:  Thursday June 25 2014 15:12:27  EDT Ventricular Rate:  122 PR Interval:  156 QRS Duration: 91 QT Interval:  359 QTC Calculation: 511 R Axis:   -52 Text Interpretation:  Ectopic atrial tachycardia, unifocal Left anterior  fascicular block Low voltage, precordial leads Consider anterior infarct  ST depr, consider ischemia, inferior leads Prolonged QT interval No old  tracing to compare Confirmed by Elaf Clauson  MD, Lorraine Terriquez ([id number]) on 07/13/2014  3:28:30 PM      MDM   Final diagnoses:  Septic shock  Acute renal failure, unspecified acute renal failure type  Coagulopathy    The patient has tachycardia, hypotension, he is not in atrial fibrillation but appears to be in a sinus tachycardia. He has signs of significant dehydration as well as anemia, eyes. He is a diabetic and has not had his sugar checked in at least 5 days, he is critically ill at this time.  Initial laboratory work shows lactic acid over 4, chest x-ray shows no acute infiltrates, I have personally seen and interpreted and reviewed the images of the portable 1 view chest x-ray, there does not appear to be any infiltrates pneumothorax or abnormal mediastinum. There is some cardiomegaly. Metabolic panel shows hyperkalemia of 5.9, acute renal failure with a creatinine of 2.0. I suspect this is related to dehydration, EKG shows no hyperacute or peaked T waves, no PR abnormalities, no QRS prolongation, slightly prolonged QT. No findings to suggest the need for aggressive hyperkalemia treatment other than IV fluids.  The patient remains significantly tachycardic and hypotensive after 3 L of IV fluids. Lab work shows a severe leukocytosis of 25,000, acute renal failure with a creatinine of 2 and a BUN of 47, electrolytes appear normal on a recheck, liver function tests show hypoalbuminemia but no other signs of liver failure, INR 3.7, urinalysis finally obtained by in and out catheterization, broad-spectrum and buttocks ordered. Completion of 30 mL/kg of IV fluids has been  ordered, will discussed with critical care as the patient appears to be in septic shock. Lactate over 4.  D/w Dr. Arsenio Loader - will arrange for ICU evaluation - will come to see in ED   CRITICAL CARE Performed by: [facility] Total critical care time: 35 Critical care time was exclusive  of separately billable procedures and treating other patients. Critical care was necessary to treat or prevent imminent or life-threatening deterioration. Critical care was time spent personally by me on the following activities: development of treatment plan with patient and/or surrogate as well as nursing, discussions with consultants, evaluation of patient's response to treatment, examination of patient, obtaining history from patient or surrogate, ordering and performing treatments and interventions, ordering and review of laboratory studies, ordering and review of radiographic studies, pulse oximetry and re-evaluation of patient's condition.     Eber Hong, MD 07/21/14 [phone]

## 2014-06-25 NOTE — Progress Notes (Signed)
ANTIBIOTIC CONSULT NOTE - INITIAL  Pharmacy Consult for vancomycin, Zosyn Indication: rule out sepsis  No Known Allergies  Patient Measurements: Weight: (!) 320 lb (145.151 kg)  Vital Signs: Temp: 97.8 F (36.6 C) (06/09 1500) Temp Source: Oral (06/09 1500) BP: 116/72 mmHg (06/09 1900) Pulse Rate: 106 (06/09 1845) Intake/Output from previous day:   Intake/Output from this shift:    Labs:  Recent Labs  07-20-14 1540 07-20-2014 1545 07-20-2014 1655  WBC  --  25.2*  --   HGB 16.3 14.2  --   PLT  --  292  --   CREATININE 2.00*  --  2.00*   Estimated Creatinine Clearance: 48.8 mL/min (by C-G formula based on Cr of 2). No results for input(s): VANCOTROUGH, VANCOPEAK, VANCORANDOM, GENTTROUGH, GENTPEAK, GENTRANDOM, TOBRATROUGH, TOBRAPEAK, TOBRARND, AMIKACINPEAK, AMIKACINTROU, AMIKACIN in the last 72 hours.   Microbiology: No results found for this or any previous visit (from the past 720 hour(s)).  Medical History: Past Medical History  Diagnosis Date  . Diabetes mellitus   . Hypertension   . Hyperlipidemia   . Morbid obesity     WITH HYPOVENTILATION  . OSA (obstructive sleep apnea)   . Atrial fibrillation     Medications:  Scheduled:  . [START ON 06/26/2014] insulin aspart  0-20 Units Subcutaneous TID WC  . insulin aspart  0-5 Units Subcutaneous QHS  . nystatin  5 mL Oral QID   Infusions:  . sodium chloride    . sodium chloride    . calcium gluconate    . metronidazole 500 mg (07-20-2014 1923)  . piperacillin-tazobactam (ZOSYN)  IV Stopped (2014-07-20 2215)  . sodium chloride    . vancomycin 1,000 mg (Jul 20, 2014 1927)   Assessment: 74 yo from home presents to ER with SOB, diarrhea, and cough. PMH includes morbidly obese, DM, HLP, OSA and hx Afib on Xarelto. To start vancomycin and Zosyn for rule out sepsis and Flagyl for rule out diarrhea. Note also per CCM note, to stop Xarelto for now and transition to IV heparin for time being.   6/9 >> vanc >> 6/9 >> Zosyn  >> 6/9 >> Flagyl >>  Afebrile, WBC elevated, SCr elevated with est CrCl N 34 ml/min/1.36m2  Goal of Therapy:  Vancomycin trough level 15-20 mcg/ml  Plan:  1) Vancomycin 2g IV (2 x 1g doses) in ER. Start vancomycin 1750mg  IV q24 beginning tomorrow 2) Zosyn 3.375g IV q8 (extended interval infusion) for CrCl > 20 ml/min 3) Flagyl 500mg  IV q8   Hessie Knows, PharmD, BCPS Pager [phone] 2014-07-20 8:10 PM

## 2014-06-25 NOTE — H&P (Signed)
PULMONARY / CRITICAL CARE MEDICINE   Name: John Paul MRN: [id number] DOB: 03-Aug-1940    ADMISSION DATE:  07/03/2014 CONSULTATION DATE:  07/03/2014  REFERRING MD :  EDP  CHIEF COMPLAINT:  Diarrhea  INITIAL PRESENTATION:  74 y.o. M brought to [facility] ED 6/9 with diarrhea x 6 days.  Found to have hypotension with minimal improvement after 4L crystalloid.  PCCM called for concerns of septic shock.   STUDIES:  CXR 6/ 9 >>> cardiomegaly  SIGNIFICANT EVENTS: 6/9 - admitted after 6 days diarrhea with decreased PO.     HISTORY OF PRESENT ILLNESS:  John Paul is a 74 y.o. M with PMH of DM, HTN, HLD, A.fib (on xarelto), OSA, morbid obesity.  He was brought to [facility] ED 6/9 due to diarrhea x 6 days.  He apparently was constipated prior to that and tried drinking more fluids and tried OTC constipation pills.  The following day, he began to have diarrhea with > 5 episodes per day since then.  He has had decreased PO intake during this time as he feels it worsens his diarrhea.  Tried to drink fluids but has not been able to do so.  Has continued to take his antihypertensive medications (atenolol, accupril, dyazide) since symptom onset.  Denies any exposure to new foods, sick contacts.  No recent abx use or recent travel.  One night earlier from ED presentation, he had a fall at his home and required paramedics to assist him off the floor.  He did not seek medical attention at the time.  On 6/9, he apparently had mild change in mental status with weakness and one episode of SOB.  This prompted his wife to bring him to the ED for further evaluation.  In ED, his BP was noted to be 113/76 but dropped with SBP in 80's at one point.  He was given 4L IVF and BP only increased to 116/72.  He has had at least one episode of diarrhea since being in the ED.  His initial labs showed multiple metabolic derangements thought to be due to dehydration.  Initial lactate 4.13, now down to 1.7 after IVF resuscitation.  WBC up  at 25.2, UA and CXR clear.  Temperature normal at 97.8 orally.  He denied any recent fevers/chills/sweats, chest pain, ongoing SOB (with exception of one episode earlier), N/V, myalgias.  He has had mild abdominal discomfort after his fall, when he landed on his abdomen.  PCCM was consulted for concerns of septic shock.  PAST MEDICAL HISTORY :   has a past medical history of Diabetes mellitus; Hypertension; Hyperlipidemia; Morbid obesity; OSA (obstructive sleep apnea); and Atrial fibrillation.  has past surgical history that includes Vasectomy and US echocardiography (02/12/2009). Prior to Admission medications   Medication Sig Start Date End Date Taking? Authorizing Provider  Alcohol Swabs (B-D SINGLE USE SWABS REGULAR) PADS  06/19/14  Yes Historical Provider, MD  atenolol (TENORMIN) 100 MG tablet  01/02/14  Yes Historical Provider, MD  docusate sodium (COLACE) 100 MG capsule Take 100 mg by mouth 2 (two) times daily.   Yes Historical Provider, MD  metFORMIN (GLUCOPHAGE-XR) 500 MG 24 hr tablet Take 500 mg by mouth 2 (two) times daily. 01/15/14  Yes Historical Provider, MD  Multiple Vitamin (MULTIVITAMIN) tablet Take 1 tablet by mouth daily.     Yes Historical Provider, MD  OVER THE COUNTER MEDICATION 1 tablet daily as needed (Sudafed- strength unknown).   Yes Historical Provider, MD  quinapril (ACCUPRIL) 20 MG tablet  01/02/14  Yes Historical Provider, MD  rivaroxaban (XARELTO) 20 MG TABS tablet Take 1 tablet (20 mg total) by mouth daily. 01/13/14  Yes Peter M [place], MD  simvastatin (ZOCOR) 20 MG tablet Take 20 mg by mouth at bedtime.     Yes Historical Provider, MD  traMADol Janean Sark) 50 MG tablet  07/30/10  Yes Historical Provider, MD  triamterene-hydrochlorothiazide (DYAZIDE) 37.5-25 MG per capsule Take 1 capsule by mouth every morning.     Yes Historical Provider, MD  TRUE METRIX BLOOD GLUCOSE TEST test strip  06/19/14  Yes Historical Provider, MD  glyBURIDE-metformin (GLUCOVANCE) 2.5-500 MG per  tablet Take 1 tablet by mouth 2 (two) times daily with a meal.      Historical Provider, MD   No Known Allergies  FAMILY HISTORY:  Family History  Problem Relation Age of Onset  . Clotting disorder Father     SOCIAL HISTORY:  reports that he quit smoking about 43 years ago. He does not have any smokeless tobacco history on file. He reports that he does not drink alcohol or use illicit drugs.  REVIEW OF SYSTEMS:   All negative; except for those that are bolded, which indicate positives.  Constitutional: weight loss, weight gain, night sweats, fevers, chills, fatigue, weakness.  HEENT: headaches, sore throat, sneezing, nasal congestion, post nasal drip, difficulty swallowing, tooth/dental problems, visual complaints, visual changes, ear aches. Neuro: difficulty with speech, weakness, numbness, ataxia. CV:  chest pain, orthopnea, PND, swelling in lower extremities, dizziness, palpitations, syncope.  Resp: cough, hemoptysis, dyspnea, wheezing. GI  heartburn, indigestion, abdominal pain, nausea, vomiting, diarrhea, constipation, change in bowel habits, loss of appetite, hematemesis, melena, hematochezia.  GU: dysuria, change in color of urine, urgency or frequency, flank pain, hematuria. MSK: joint pain or swelling, decreased range of motion. Psych: change in mood or affect, depression, anxiety, suicidal ideations, homicidal ideations. Skin: rash, itching, bruising.   SUBJECTIVE:  Has had continued diarrhea here in ED.  Denies any chest pain, SOB, lightheadedness.  VITAL SIGNS: Temp:  [97.8 F (36.6 C)] 97.8 F (36.6 C) (06/09 1500) Pulse Rate:  [102-126] 106 (06/09 1845) Resp:  [14-29] 14 (06/09 1900) BP: (89-116)/(51-76) 116/72 mmHg (06/09 1900) SpO2:  [96 %-100 %] 100 % (06/09 1845) HEMODYNAMICS:   VENTILATOR SETTINGS:   INTAKE / OUTPUT: Intake/Output    None     PHYSICAL EXAMINATION: General: Morbidly obese adult male, in NAD. Neuro: A&O x 3, non-focal.  HEENT: [place]/AT.  PERRL, sclerae anicteric. MM dry.  Tongue and oral mucosa with thrush. Cardiovascular: IRIR, no M/R/G.  Lungs: Respirations unlabored.  BS distant but no W/R/R appreciated. Abdomen: Morbidly obese.  BS not heard, abd soft, NT/ND.  Musculoskeletal: No gross deformities, no edema.  Skin: Intact, warm, no rashes.  LABS:  CBC  Recent Labs Lab 06/30/2014 1540 06/23/2014 1545  WBC  --  25.2*  HGB 16.3 14.2  HCT 48.0 43.3  PLT  --  292   Coag's  Recent Labs Lab 07/12/2014 1546  APTT 38*  INR 3.70*   BMET  Recent Labs Lab 06/29/2014 1540 06/20/2014 1655  NA 134* 138  K 5.9* 4.5  CL 106 107  CO2  --  20*  BUN 64* 47*  CREATININE 2.00* 2.00*  GLUCOSE 168* 131*   Electrolytes  Recent Labs Lab 07/11/2014 1655  CALCIUM 8.0*   Sepsis Markers  Recent Labs Lab 06/17/2014 1539 07/15/2014 1849  LATICACIDVEN 4.13* 1.70   ABG No results for input(s): PHART, PCO2ART, PO2ART in the last 168 hours.  Liver Enzymes  Recent Labs Lab 06-29-2014 1653  AST 18  ALT 15*  ALKPHOS 71  BILITOT 0.8  ALBUMIN 2.8*   Cardiac Enzymes No results for input(s): TROPONINI, PROBNP in the last 168 hours. Glucose  Recent Labs Lab 06-29-14 1538  GLUCAP 157*    Imaging Dg Chest Port 1 View  2014/06/29   CLINICAL DATA:  Short of breath.  Diarrhea.  Cough.  EXAM: PORTABLE CHEST - 1 VIEW  COMPARISON:  04/14/2005.  FINDINGS: Chronic cardiomegaly. No airspace disease or pleural effusion. Monitoring leads project over the chest. Basilar atelectasis.  IMPRESSION: Chronic cardiomegaly without acute cardiopulmonary disease.   Electronically Signed   By: Andreas Newport M.D.   On: June 29, 2014 15:29   ASSESSMENT / PLAN:  CARDIOVASCULAR A:  Hypotension with possibility of shock - he has gradually responded to IVF resuscitation.  High likelihood that this is due to hypovolemia given excessive diarrhea x 6 days along with decreased PO intake.  This is further exacerbated by the fact that pt has continued to  take his triple therapy outpatient antihypertensive regimen.  However, given his diarrhea and leukocytosis, will consider infectious etiology / septic shock due to possible C.diff.  Furthermore, I question how accurate his cuff pressures are given his morbid obesity (pressures currently being taken in forearm). Hx A.fib on xarelto, HTN, HLD. P:  Goal MAP > 65. Additional 1L IVF bolus now then MIVF. Would even consider additional 1L bolus for total of 6L. If no improvement, may require CVL and vasopressor support. Heparin per pharmacy in lieu of outpatient xarelto. Hold outpatient atenolol, accupril, dyazide.  RENAL A:   NON AG metabolic acidosis - due to diarrhea x 6 days. AKI. Hypocalcemia. Hyponatremia - resolved. Hyperkalemia - resolved. P:   NS @ 150. 1g Ca gluconate. BMP in AM.  INFECTIOUS A:   Leukocytosis - unclear etiology at this point; though consider C.diff or other abdominal source. Thrush. P:   BCx2 6/9 > UCx 6/9 > Abx: Vanc, start date 6/9, day 1/x. Abx: Zosyn, start date 6/9, day 1/x. Abx: Flagyl, start date 6/9, day 1/x. Antifungal:  Nystatin. PCT algorithm to limit abx exposure.  GASTROINTESTINAL A:   Nutrition. Morbid obesity. P:   Heart healthy / carb modified diet. Weight loss encouraged.  HEMATOLOGIC A:   Supratherapeutic INR. VTE Prophylaxis. P:  Hold outpatient xarelto. SCD's / heparin gtt. CBC in AM.  ENDOCRINE A:   DM. P:   CBG's q4hr. SSI. Hold outpatient metformin, glucovance.  PULMONARY A: OSA with probable OHS - not on CPAP. Suspect component of restrictive lung disease given body habitus. P:   Pulmonary hygiene. If needed, would use BiPAP QHS.  NEUROLOGIC A:   No acute issues. P:   No interventions required.   Family updated: Wife at bedside.  Interdisciplinary Family Meeting v Palliative Care Meeting:  Due by: 07/01/14.   Rutherford Guys, [place] [facility] Pager: [phone]  or [phone] 2014/06/29, 7:15 PM

## 2014-06-25 NOTE — ED Notes (Signed)
edp notified abt critical lab results

## 2014-06-26 DIAGNOSIS — E860 Dehydration: Secondary | ICD-10-CM | POA: Diagnosis present

## 2014-06-26 DIAGNOSIS — E872 Acidosis, unspecified: Secondary | ICD-10-CM | POA: Diagnosis present

## 2014-06-26 DIAGNOSIS — E871 Hypo-osmolality and hyponatremia: Secondary | ICD-10-CM | POA: Diagnosis present

## 2014-06-26 DIAGNOSIS — I951 Orthostatic hypotension: Secondary | ICD-10-CM

## 2014-06-26 DIAGNOSIS — R197 Diarrhea, unspecified: Secondary | ICD-10-CM | POA: Diagnosis present

## 2014-06-26 DIAGNOSIS — E875 Hyperkalemia: Secondary | ICD-10-CM | POA: Diagnosis present

## 2014-06-26 DIAGNOSIS — I482 Chronic atrial fibrillation: Secondary | ICD-10-CM

## 2014-06-26 DIAGNOSIS — L8990 Pressure ulcer of unspecified site, unspecified stage: Secondary | ICD-10-CM | POA: Diagnosis present

## 2014-06-26 DIAGNOSIS — I10 Essential (primary) hypertension: Secondary | ICD-10-CM

## 2014-06-26 LAB — BASIC METABOLIC PANEL
Anion gap: 8 (ref 5–15)
BUN: 37 mg/dL — ABNORMAL HIGH (ref 6–20)
CALCIUM: 7.9 mg/dL — AB (ref 8.9–10.3)
CO2: 19 mmol/L — ABNORMAL LOW (ref 22–32)
CREATININE: 1.57 mg/dL — AB (ref 0.61–1.24)
Chloride: 110 mmol/L (ref 101–111)
GFR calc Af Amer: 49 mL/min — ABNORMAL LOW (ref >60)
GFR, EST NON AFRICAN AMERICAN: 43 mL/min — AB (ref >60)
GLUCOSE: 133 mg/dL — AB (ref 65–99)
Potassium: 4.5 mmol/L (ref 3.5–5.1)
Sodium: 137 mmol/L (ref 135–145)

## 2014-06-26 LAB — PHOSPHORUS: Phosphorus: 2.9 mg/dL (ref 2.5–4.6)

## 2014-06-26 LAB — GLUCOSE, CAPILLARY
GLUCOSE-CAPILLARY: 96 mg/dL (ref 65–99)
Glucose-Capillary: 128 mg/dL — ABNORMAL HIGH (ref 65–99)
Glucose-Capillary: 134 mg/dL — ABNORMAL HIGH (ref 65–99)
Glucose-Capillary: 139 mg/dL — ABNORMAL HIGH (ref 65–99)

## 2014-06-26 LAB — BLOOD GAS, ARTERIAL
Acid-base deficit: 5.1 mmol/L — ABNORMAL HIGH (ref 0.0–2.0)
Bicarbonate: 17.2 mEq/L — ABNORMAL LOW (ref 20.0–24.0)
Drawn by: 437521
FIO2: 0.21 %
O2 Saturation: 96.1 %
Patient temperature: 98.6
TCO2: 15.3 mmol/L (ref 0–100)
pCO2 arterial: 25.5 mmHg — ABNORMAL LOW (ref 35.0–45.0)
pH, Arterial: 7.444 (ref 7.350–7.450)
pO2, Arterial: 85.1 mmHg (ref 80.0–100.0)

## 2014-06-26 LAB — CBC
HCT: 37.2 % — ABNORMAL LOW (ref 39.0–52.0)
HEMOGLOBIN: 12.2 g/dL — AB (ref 13.0–17.0)
MCH: 31.5 pg (ref 26.0–34.0)
MCHC: 32.8 g/dL (ref 30.0–36.0)
MCV: 96.1 fL (ref 78.0–100.0)
PLATELETS: 271 10*3/uL (ref 150–400)
RBC: 3.87 MIL/uL — AB (ref 4.22–5.81)
RDW: 14.2 % (ref 11.5–15.5)
WBC: 19.8 10*3/uL — ABNORMAL HIGH (ref 4.0–10.5)

## 2014-06-26 LAB — HIV ANTIBODY (ROUTINE TESTING W REFLEX): HIV SCREEN 4TH GENERATION: NONREACTIVE

## 2014-06-26 LAB — TROPONIN I: Troponin I: <0.03 ng/mL (ref <0.031)

## 2014-06-26 LAB — PROCALCITONIN
Procalcitonin: 1.17 ng/mL
Procalcitonin: 1.04 ng/mL

## 2014-06-26 LAB — URINE CULTURE
Colony Count: NO GROWTH
Culture: NO GROWTH
Special Requests: NORMAL

## 2014-06-26 LAB — MRSA PCR SCREENING: MRSA by PCR: NEGATIVE

## 2014-06-26 LAB — APTT: aPTT: 43 seconds — ABNORMAL HIGH (ref 24–37)

## 2014-06-26 LAB — HEPARIN LEVEL (UNFRACTIONATED): Heparin Unfractionated: >2.2 IU/mL — ABNORMAL HIGH (ref 0.30–0.70)

## 2014-06-26 LAB — MAGNESIUM: Magnesium: 1.6 mg/dL — ABNORMAL LOW (ref 1.7–2.4)

## 2014-06-26 MED ORDER — NYSTATIN 100000 UNIT/GM EX POWD
Freq: Three times a day (TID) | CUTANEOUS | Status: DC
  Administered 2014-06-26 – 2014-06-29 (×10): via TOPICAL
  Administered 2014-06-30: 1 via TOPICAL
  Administered 2014-06-30 – 2014-07-13 (×38): via TOPICAL
  Administered 2014-07-13: 1 via TOPICAL
  Administered 2014-07-13 – 2014-07-15 (×6): via TOPICAL
  Filled 2014-06-26 (×4): qty 15

## 2014-06-26 MED ORDER — CHLORHEXIDINE GLUCONATE 0.12 % MT SOLN
15.0000 mL | Freq: Two times a day (BID) | OROMUCOSAL | Status: DC
  Administered 2014-06-26 – 2014-07-15 (×34): 15 mL via OROMUCOSAL
  Filled 2014-06-26 (×36): qty 15

## 2014-06-26 MED ORDER — CETYLPYRIDINIUM CHLORIDE 0.05 % MT LIQD
7.0000 mL | Freq: Two times a day (BID) | OROMUCOSAL | Status: DC
  Administered 2014-06-26 – 2014-07-15 (×32): 7 mL via OROMUCOSAL

## 2014-06-26 MED ORDER — RIVAROXABAN 20 MG PO TABS
20.0000 mg | ORAL_TABLET | Freq: Every day | ORAL | Status: DC
  Administered 2014-06-26 – 2014-07-04 (×9): 20 mg via ORAL
  Filled 2014-06-26 (×10): qty 1

## 2014-06-26 MED ORDER — HYDROCODONE-ACETAMINOPHEN 5-325 MG PO TABS
1.0000 | ORAL_TABLET | Freq: Four times a day (QID) | ORAL | Status: DC | PRN
  Administered 2014-06-26 – 2014-07-12 (×15): 1 via ORAL
  Filled 2014-06-26 (×15): qty 1

## 2014-06-26 MED ORDER — DILTIAZEM HCL 100 MG IV SOLR
5.0000 mg/h | INTRAVENOUS | Status: DC
  Administered 2014-06-26: 5 mg/h via INTRAVENOUS
  Administered 2014-06-27 (×2): 10 mg/h via INTRAVENOUS
  Administered 2014-07-06 – 2014-07-07 (×2): 5 mg/h via INTRAVENOUS
  Filled 2014-06-26 (×6): qty 100

## 2014-06-26 MED ORDER — SODIUM CHLORIDE 0.9 % IV SOLN
INTRAVENOUS | Status: DC
  Administered 2014-06-26: 18:00:00 via INTRAVENOUS

## 2014-06-26 NOTE — Progress Notes (Signed)
Nutrition Brief Note  Patient identified on the Malnutrition Screening Tool (MST) Report  Wt Readings from Last 15 Encounters:  06/26/14 342 lb (155.13 kg)  01/13/14 316 lb 4.8 oz (143.473 kg)  12/18/12 367 lb 6.4 oz (166.652 kg)  09/07/11 391 lb 12.8 oz (177.719 kg)  02/08/11 387 lb (175.542 kg)  08/03/10 390 lb 9.6 oz (177.175 kg)    Body mass index is 49.07 kg/(m^2). Patient meets criteria for morbid obesity based on current BMI.   Current diet order is heart healthy/carb modified, patient is consuming approximately 100% of meals at this time. Labs and medications reviewed.   Pt reports no recent weight loss or changes in appetite  No nutrition interventions warranted at this time. If nutrition issues arise, please consult RD.   John Kluver MS, RD, LDN [phone]

## 2014-06-26 NOTE — Progress Notes (Signed)
[facility] floor coverage was notified of patient's increasing heart rate (130s); EKG showed A-Flutter, he was also made aware of pt's restless  (alert but disoriented to time and place ). Order for ABG was received and also for cardizem IV infusion. Pt is currently on room air with his oxygen saturation in the  High 90s.

## 2014-06-26 NOTE — Progress Notes (Signed)
[facility] PROGRESS NOTE  John Paul ZOX:[id number] DOB: 08-03-1940 DOA: 2014/07/24 PCP: Eartha Inch, MD  Assessment/Plan: 1. Hypotension secondary to profound dehydration 1. Likely secondary to profound diarrhea, per below 2. Much improved with aggressive IVF hydration 3. Cont hydration as tolerated 2. Diarrhea possibly secondary to infectious gastroenteritis 1. Cdiff ordered, pending 3. Lactic acidosis 1. Much improved with IVF hydration 2. Cont to follow 4. Non-anion gap metabolic acidosis 1. Likely secondary to profound diarrhea 2. Stable 5. Hyponatremia 1. Resolved 6. Hyperkalemia 1. Resolved 7. Morbid obesity 1. Stable 8. DM2 1. On ssi coverage 2. Glucose has remained stable and controlled 3. Will f/u on a1c 9. Afib 1. Rate controlled 2. Pt is on therapeutic xarelto. Will continue 10. OSA, not on CPAP 1. Stable 2. Per Critical Care, QHS BiPAP if needed 11. DVT prophylaxis 1. On Xarelto, renally dosed  Code Status: Full Family Communication: Pt in room (indicate person spoken with, relationship, and if by phone, the number) Disposition Plan: Pending   Consultants:  Critical Care  Procedures:    Antibiotics:  Vancomycin 6/9>>>6/10  Zosyn 6/9>>>6/10  Flagyl 6/9>>>  HPI/Subjective: States feels better today but still thirsty  Objective: Filed Vitals:   06/26/14 1000 06/26/14 1200 06/26/14 1400 06/26/14 1500  BP: 109/66 118/70 119/63 118/81  Pulse: 103 110 113 107  Temp:  98.4 F (36.9 C)    TempSrc:  Oral    Resp: Height:      Weight:      SpO2: 99% 100% 95% 94%    Intake/Output Summary (Last 24 hours) at 06/26/14 1727 Last data filed at 06/26/14 1500  Gross per 24 hour  Intake   3175 ml  Output      0 ml  Net   3175 ml   Filed Weights   07/24/2014 2000 07-24-14 2133 06/26/14 0146  Weight: 145.151 kg (320 lb) 145.151 kg (320 lb) 155.13 kg (342 lb)    Exam:   General:  Awake, in nad  Cardiovascular:  regular, s1, s2  Respiratory: normal resp effort, no wheezing  Abdomen: soft, obese, nondistended  Musculoskeletal: perfused, no clubbing   Data Reviewed: Basic Metabolic Panel:  Recent Labs Lab 07-24-2014 1540 07/24/2014 1655 06/26/14 0406  NA 134* 138 137  K 5.9* 4.5 4.5  CL 106 107 110  CO2  --  20* 19*  GLUCOSE 168* 131* 133*  BUN 64* 47* 37*  CREATININE 2.00* 2.00* 1.57*  CALCIUM  --  8.0* 7.9*  MG  --   --  1.6*  PHOS  --   --  2.9   Liver Function Tests:  Recent Labs Lab 2014/07/24 1653  AST 18  ALT 15*  ALKPHOS 71  BILITOT 0.8  PROT 6.1*  ALBUMIN 2.8*   No results for input(s): LIPASE, AMYLASE in the last 168 hours.  Recent Labs Lab July 24, 2014 1653  AMMONIA 11   CBC:  Recent Labs Lab July 24, 2014 1540 24-Jul-2014 1545 06/26/14 0406  WBC  --  25.2* 19.8*  HGB 16.3 14.2 12.2*  HCT 48.0 43.3 37.2*  MCV  --  94.5 96.1  PLT  --  292 271   Cardiac Enzymes:  Recent Labs Lab 24-Jul-2014 2241 06/26/14 0406 06/26/14 1220  TROPONINI <0.03 <0.03 <0.03   BNP (last 3 results)  Recent Labs  24-Jul-2014 1546  BNP 92.8    ProBNP (last 3 results) No results for input(s): PROBNP in the last 8760 hours.  CBG:  Recent Labs Lab  Jun 27, 2014 1538 06/26/14 0746 06/26/14 1132 06/26/14 1607  GLUCAP 157* 128* 96 134*    Recent Results (from the past 240 hour(s))  Blood culture (routine x 2)     Status: None (Preliminary result)   Collection Time: 06/27/14  3:28 PM  Result Value Ref Range Status   Specimen Description BLOOD RIGHT ARM  Final   Special Requests BOTTLES DRAWN AEROBIC AND ANAEROBIC 5CC EACH  Final   Culture   Final           BLOOD CULTURE RECEIVED NO GROWTH TO DATE CULTURE WILL BE HELD FOR 5 DAYS BEFORE ISSUING A FINAL NEGATIVE REPORT Performed at [facility]    Report Status PENDING  Incomplete  Blood culture (routine x 2)     Status: None (Preliminary result)   Collection Time: June 27, 2014  3:29 PM  Result Value Ref Range Status    Specimen Description BLOOD LEFT ARM  Final   Special Requests BOTTLES DRAWN AEROBIC AND ANAEROBIC 5CC EACH  Final   Culture   Final           BLOOD CULTURE RECEIVED NO GROWTH TO DATE CULTURE WILL BE HELD FOR 5 DAYS BEFORE ISSUING A FINAL NEGATIVE REPORT Performed at [facility]    Report Status PENDING  Incomplete  MRSA PCR Screening     Status: None   Collection Time: 2014/06/27 10:01 PM  Result Value Ref Range Status   MRSA by PCR NEGATIVE NEGATIVE Final    Comment:        The GeneXpert MRSA Assay (FDA approved for NASAL specimens only), is one component of a comprehensive MRSA colonization surveillance program. It is not intended to diagnose MRSA infection nor to guide or monitor treatment for MRSA infections.      Studies: Dg Chest Port 1 View  2014-06-27   CLINICAL DATA:  Short of breath.  Diarrhea.  Cough.  EXAM: PORTABLE CHEST - 1 VIEW  COMPARISON:  04/14/2005.  FINDINGS: Chronic cardiomegaly. No airspace disease or pleural effusion. Monitoring leads project over the chest. Basilar atelectasis.  IMPRESSION: Chronic cardiomegaly without acute cardiopulmonary disease.   Electronically Signed   By: Andreas Newport M.D.   On: June 27, 2014 15:29    Scheduled Meds: . antiseptic oral rinse  7 mL Mouth Rinse q12n4p  . chlorhexidine  15 mL Mouth Rinse BID  . insulin aspart  0-20 Units Subcutaneous TID WC  . insulin aspart  0-5 Units Subcutaneous QHS  . metronidazole  500 mg Intravenous Q8H  . nystatin  5 mL Oral QID  . rivaroxaban  20 mg Oral Daily  . sodium chloride  1,000 mL Intravenous Once   Continuous Infusions: . sodium chloride 150 mL/hr at 06/26/14 1057    Principal Problem:   Dehydration, severe Active Problems:   Atrial fibrillation   Morbid obesity   Hypertension   Diabetes mellitus type 2, noninsulin dependent   Hypotension   Acute kidney injury   Pressure ulcer   Diarrhea   Hyperkalemia   Hyponatremia   Metabolic acidosis with normal anion gap  and bicarbonate losses   Lactic acidosis   John Paul K  [facility] Pager [phone]. If 7PM-7AM, please contact night-coverage at [email or web address], password [facility] 06/26/2014, 5:27 PM  LOS: 1 day

## 2014-06-26 NOTE — Progress Notes (Signed)
Date:  June 26, 2014 U.R. performed for needs and level of care. Will continue to follow for Case Management needs.  Rhonda Davis, RN, BSN, CCM   [phone] 

## 2014-06-26 NOTE — Progress Notes (Signed)
PHARMACIST - PHYSICIAN COMMUNICATION CONCERNING:  IV Heparin  73 yoM on Xarelto 20mg  daily for hx Afib at home with orders to start IV heparin bridge for ARF.  Please see note written yesterday by Earl Many, PharmD for more details.  Renal function improving today therefore after discussion with Dr. Rhona Leavens, will d/c orders for heparin (never started) and resume Xarelto. Hgb slightly low, plts WNL.  No bleeding issues noted.   Plan: Resume Xarelto 20mg  daily, no doses missed.    Haynes Hoehn, PharmD, BCPS 06/26/2014, 9:03 AM  Pager: [phone]

## 2014-06-26 NOTE — Care Management Note (Signed)
Case Management Note  Patient Details  Name: John Paul MRN: [id number] Date of Birth: 01/24/1940  Subjective/Objective:              Hypovolemia after 4l of iv flds      Action/Plan:home   Expected Discharge Date:   (unknown)            [id number]   Expected Discharge Plan:  Home/Self Care  In-House Referral:  NA  Discharge planning Services  CM Consult  Post Acute Care Choice:  NA Choice offered to:  NA  DME Arranged:  N/A DME Agency:  NA  HH Arranged:  NA HH Agency:  NA  Status of Service:  In process, will continue to follow  Medicare Important Message Given:    Date Medicare IM Given:    Medicare IM give by:    Date Additional Medicare IM Given:    Additional Medicare Important Message give by:     If discussed at Long Length of Stay Meetings, dates discussed:    Additional Comments:  Golda Acre, RN 06/26/2014, 10:10 AM

## 2014-06-27 ENCOUNTER — Inpatient Hospital Stay (HOSPITAL_COMMUNITY): Payer: Medicare HMO

## 2014-06-27 DIAGNOSIS — E119 Type 2 diabetes mellitus without complications: Secondary | ICD-10-CM

## 2014-06-27 LAB — URINE CULTURE
Colony Count: NO GROWTH
Culture: NO GROWTH

## 2014-06-27 LAB — COMPREHENSIVE METABOLIC PANEL WITH GFR
ALT: 14 U/L — ABNORMAL LOW (ref 17–63)
AST: 16 U/L (ref 15–41)
Albumin: 2.3 g/dL — ABNORMAL LOW (ref 3.5–5.0)
Alkaline Phosphatase: 66 U/L (ref 38–126)
Anion gap: 9 (ref 5–15)
BUN: 23 mg/dL — ABNORMAL HIGH (ref 6–20)
CO2: 21 mmol/L — ABNORMAL LOW (ref 22–32)
Calcium: 7.7 mg/dL — ABNORMAL LOW (ref 8.9–10.3)
Chloride: 110 mmol/L (ref 101–111)
Creatinine, Ser: 1.25 mg/dL — ABNORMAL HIGH (ref 0.61–1.24)
GFR calc Af Amer: >60 mL/min
GFR calc non Af Amer: 56 mL/min — ABNORMAL LOW
Glucose, Bld: 141 mg/dL — ABNORMAL HIGH (ref 65–99)
Potassium: 3.9 mmol/L (ref 3.5–5.1)
Sodium: 140 mmol/L (ref 135–145)
Total Bilirubin: 0.4 mg/dL (ref 0.3–1.2)
Total Protein: 5.3 g/dL — ABNORMAL LOW (ref 6.5–8.1)

## 2014-06-27 LAB — GLUCOSE, CAPILLARY
GLUCOSE-CAPILLARY: 151 mg/dL — AB (ref 65–99)
GLUCOSE-CAPILLARY: 117 mg/dL — AB (ref 65–99)
Glucose-Capillary: 123 mg/dL — ABNORMAL HIGH (ref 65–99)
Glucose-Capillary: 150 mg/dL — ABNORMAL HIGH (ref 65–99)

## 2014-06-27 LAB — PROCALCITONIN: Procalcitonin: 0.73 ng/mL

## 2014-06-27 LAB — CLOSTRIDIUM DIFFICILE BY PCR: Toxigenic C. Difficile by PCR: NEGATIVE

## 2014-06-27 LAB — MAGNESIUM: MAGNESIUM: 1.3 mg/dL — AB (ref 1.7–2.4)

## 2014-06-27 MED ORDER — ATENOLOL 25 MG PO TABS
100.0000 mg | ORAL_TABLET | Freq: Every day | ORAL | Status: DC
Start: 2014-06-27 — End: 2014-06-28
  Administered 2014-06-27: 100 mg via ORAL
  Filled 2014-06-27: qty 4

## 2014-06-27 MED ORDER — MAGNESIUM SULFATE 2 GM/50ML IV SOLN
2.0000 g | Freq: Once | INTRAVENOUS | Status: AC
  Administered 2014-06-27: 2 g via INTRAVENOUS
  Filled 2014-06-27: qty 50

## 2014-06-27 MED ORDER — SIMVASTATIN 10 MG PO TABS: 20.0000 mg | ORAL_TABLET | Freq: Every day | ORAL | Status: DC

## 2014-06-27 MED ORDER — ATORVASTATIN CALCIUM 10 MG PO TABS
10.0000 mg | ORAL_TABLET | Freq: Every day | ORAL | Status: DC
  Administered 2014-06-27 – 2014-07-14 (×15): 10 mg via ORAL
  Filled 2014-06-27 (×16): qty 1

## 2014-06-27 NOTE — Progress Notes (Addendum)
[facility] PROGRESS NOTE  John Paul WNU:[id number] DOB: 1940/09/09 DOA: 07/13/2014 PCP: Eartha Inch, MD  Assessment/Plan: 1. Hypotension secondary to profound dehydration 1. Suspect secondary to profound diarrhea, per below 2. Much improved after aggressive IVF hydration 3. BP now mildly elevated 4. Stop IVF 2. Fevers 1. Blood and urine cultures obtained, pending 2. Urinalysis from 6/9 is unremarkable 3. Diarrhea possibly secondary to infectious gastroenteritis 1. Cdiff ordered, awaiting stool collection 2. On empiric flagyl 4. Lactic acidosis 1. Much improved following IVF hydration 2. Cont to follow 5. Non-anion gap metabolic acidosis 1. Likely secondary to profound diarrhea 2. Stable 6. Hyponatremia 1. Resolved 7. Hyperkalemia 1. Resolved 8. Hypomagnesemia 1. Replaced 9. Morbid obesity 1. Stable 10. DM2 1. On ssi coverage 2. Glucose has remained stable and controlled 3. Will f/u on a1c 11. Afib with RVR 1. Worsened and pt with RVR overnight/early this AM 2. Pt is now dependent on cardizem gtt by IV 3. Resumed PO atenolol per home regimen 4. Would titrate cardizem to off as tolerated 5. Pt is on therapeutic xarelto. Will continue 12. OSA, not on CPAP 1. Stable 2. Per Critical Care, QHS BiPAP if needed 13. DVT prophylaxis 1. On Xarelto, renally dosed  Code Status: Full Family Communication: Pt in room Disposition Plan: Pending   Consultants:  Critical Care  Procedures:    Antibiotics:  Vancomycin 6/9>>>6/10  Zosyn 6/9>>>6/10  Flagyl 6/9>>>  HPI/Subjective: Pt is without complaints.  Objective: Filed Vitals:   06/27/14 1300 06/27/14 1400 06/27/14 1500 06/27/14 1600  BP: 113/68  114/59 98/72  Pulse: 87  84 85  Temp:    98 F (36.7 C)  TempSrc:    Oral  Resp: 21 24 23 24   Height:      Weight:      SpO2: 99%  96% 96%    Intake/Output Summary (Last 24 hours) at 06/27/14 1646 Last data filed at 06/27/14 1600  Gross per  24 hour  Intake 2756.08 ml  Output   1750 ml  Net 1006.08 ml   Filed Weights   07/13/14 2133 06/26/14 0146 06/27/14 0320  Weight: 145.151 kg (320 lb) 155.13 kg (342 lb) 156.037 kg (344 lb)    Exam:   General:  Awake, laying in bed, in nad  Cardiovascular: regular, s1, s2  Respiratory: normal resp effort, no wheezing  Abdomen: soft, obese, nondistended, pos BS  Musculoskeletal: perfused, no clubbing   Data Reviewed: Basic Metabolic Panel:  Recent Labs Lab 07-13-2014 1540 07-13-2014 1655 06/26/14 0406 06/27/14 0403  NA 134* 138 137 140  K 5.9* 4.5 4.5 3.9  CL 106 107 110 110  CO2  --  20* 19* 21*  GLUCOSE 168* 131* 133* 141*  BUN 64* 47* 37* 23*  CREATININE 2.00* 2.00* 1.57* 1.25*  CALCIUM  --  8.0* 7.9* 7.7*  MG  --   --  1.6* 1.3*  PHOS  --   --  2.9  --    Liver Function Tests:  Recent Labs Lab 07/13/14 1653 06/27/14 0403  AST 18 16  ALT 15* 14*  ALKPHOS 71 66  BILITOT 0.8 0.4  PROT 6.1* 5.3*  ALBUMIN 2.8* 2.3*   No results for input(s): LIPASE, AMYLASE in the last 168 hours.  Recent Labs Lab 2014-07-13 1653  AMMONIA 11   CBC:  Recent Labs Lab July 13, 2014 1540 07/13/14 1545 06/26/14 0406  WBC  --  25.2* 19.8*  HGB 16.3 14.2 12.2*  HCT 48.0 43.3 37.2*  MCV  --  94.5 96.1  PLT  --  292 271   Cardiac Enzymes:  Recent Labs Lab 07-22-2014 2241 06/26/14 0406 06/26/14 1220  TROPONINI <0.03 <0.03 <0.03   BNP (last 3 results)  Recent Labs  July 22, 2014 1546  BNP 92.8    ProBNP (last 3 results) No results for input(s): PROBNP in the last 8760 hours.  CBG:  Recent Labs Lab 06/26/14 1607 06/26/14 2126 06/27/14 0752 06/27/14 1154 06/27/14 1618  GLUCAP 134* 139* 151* 123* 117*    Recent Results (from the past 240 hour(s))  Blood culture (routine x 2)     Status: None (Preliminary result)   Collection Time: 2014-07-22  3:28 PM  Result Value Ref Range Status   Specimen Description BLOOD RIGHT ARM  Final   Special Requests BOTTLES DRAWN  AEROBIC AND ANAEROBIC 5CC EACH  Final   Culture   Final           BLOOD CULTURE RECEIVED NO GROWTH TO DATE CULTURE WILL BE HELD FOR 5 DAYS BEFORE ISSUING A FINAL NEGATIVE REPORT Performed at [facility]    Report Status PENDING  Incomplete  Blood culture (routine x 2)     Status: None (Preliminary result)   Collection Time: July 22, 2014  3:29 PM  Result Value Ref Range Status   Specimen Description BLOOD LEFT ARM  Final   Special Requests BOTTLES DRAWN AEROBIC AND ANAEROBIC 5CC EACH  Final   Culture   Final           BLOOD CULTURE RECEIVED NO GROWTH TO DATE CULTURE WILL BE HELD FOR 5 DAYS BEFORE ISSUING A FINAL NEGATIVE REPORT Performed at [facility]    Report Status PENDING  Incomplete  Urine culture     Status: None   Collection Time: 07/22/14  5:46 PM  Result Value Ref Range Status   Specimen Description Urine  Final   Special Requests Normal  Final   Colony Count NO GROWTH Performed at [facility]   Final   Culture NO GROWTH Performed at [facility]   Final   Report Status 06/26/2014 FINAL  Final  MRSA PCR Screening     Status: None   Collection Time: July 22, 2014 10:01 PM  Result Value Ref Range Status   MRSA by PCR NEGATIVE NEGATIVE Final    Comment:        The GeneXpert MRSA Assay (FDA approved for NASAL specimens only), is one component of a comprehensive MRSA colonization surveillance program. It is not intended to diagnose MRSA infection nor to guide or monitor treatment for MRSA infections.   Culture, Urine     Status: None   Collection Time: 06/26/14  1:45 AM  Result Value Ref Range Status   Specimen Description URINE, CATHETERIZED  Final   Special Requests NONE  Final   Colony Count NO GROWTH Performed at [facility]   Final   Culture NO GROWTH Performed at [facility]   Final   Report Status 06/27/2014 FINAL  Final     Studies: Dg Chest Port 1 View  06/27/2014   CLINICAL DATA:  Shortness of breath  with diarrhea intermittently for 2 days. History of diabetes and obesity. Initial encounter.  EXAM: PORTABLE CHEST - 1 VIEW  COMPARISON:  04/14/2005 and 07-22-14 radiographs.  FINDINGS: 1035 hours. Stable cardiomegaly and mild chronic vascular congestion. No edema, confluent airspace opacity or pleural effusion. The bones appear unchanged. Telemetry leads overlie the chest.  IMPRESSION: Stable chest with mild cardiomegaly.  No acute findings demonstrated   Electronically Signed   By: Carey Bullocks M.D.   On: 06/27/2014 12:21   Dg Abd Portable 1v  06/27/2014   CLINICAL DATA:  Shortness of breath with intermittent diarrhea for 2 days. History of hypertension and obesity. Initial encounter.  EXAM: PORTABLE ABDOMEN - 1 VIEW  COMPARISON:  None.  FINDINGS: Examination is limited by body habitus and breathing artifact. The visualized bowel gas pattern is nonobstructive. There is no supine evidence of free intraperitoneal air or bowel wall thickening. There are no suspicious abdominal calcifications. Degenerative changes are present throughout the visualized spine.  IMPRESSION: No acute findings or explanation for the patient's symptoms. Examination is limited by body habitus and breathing artifact.   Electronically Signed   By: Carey Bullocks M.D.   On: 06/27/2014 12:23    Scheduled Meds: . antiseptic oral rinse  7 mL Mouth Rinse q12n4p  . atenolol  100 mg Oral Daily  . atorvastatin  10 mg Oral QHS  . chlorhexidine  15 mL Mouth Rinse BID  . insulin aspart  0-20 Units Subcutaneous TID WC  . insulin aspart  0-5 Units Subcutaneous QHS  . metronidazole  500 mg Intravenous Q8H  . nystatin  5 mL Oral QID  . nystatin   Topical TID  . rivaroxaban  20 mg Oral Daily  . sodium chloride  1,000 mL Intravenous Once   Continuous Infusions: . sodium chloride 10 mL/hr at 06/26/14 1739  . diltiazem (CARDIZEM) infusion 10 mg/hr (06/27/14 1548)    Principal Problem:   Dehydration, severe Active Problems:    Atrial fibrillation   Morbid obesity   Hypertension   Diabetes mellitus type 2, noninsulin dependent   Hypotension   Acute kidney injury   Pressure ulcer   Diarrhea   Hyperkalemia   Hyponatremia   Metabolic acidosis with normal anion gap and bicarbonate losses   Lactic acidosis   CHIU, STEPHEN K  [facility] Pager [phone]. If 7PM-7AM, please contact night-coverage at [email or web address], password [facility] 06/27/2014, 4:46 PM  LOS: 2 days

## 2014-06-28 LAB — CBC
HEMATOCRIT: 37.9 % — AB (ref 39.0–52.0)
Hemoglobin: 12.5 g/dL — ABNORMAL LOW (ref 13.0–17.0)
MCH: 31.6 pg (ref 26.0–34.0)
MCHC: 33 g/dL (ref 30.0–36.0)
MCV: 95.7 fL (ref 78.0–100.0)
Platelets: 327 10*3/uL (ref 150–400)
RBC: 3.96 MIL/uL — ABNORMAL LOW (ref 4.22–5.81)
RDW: 14.1 % (ref 11.5–15.5)
WBC: 16.4 10*3/uL — AB (ref 4.0–10.5)

## 2014-06-28 LAB — BASIC METABOLIC PANEL
Anion gap: 9 (ref 5–15)
BUN: 17 mg/dL (ref 6–20)
CALCIUM: 7.4 mg/dL — AB (ref 8.9–10.3)
CHLORIDE: 107 mmol/L (ref 101–111)
CO2: 21 mmol/L — AB (ref 22–32)
CREATININE: 0.95 mg/dL (ref 0.61–1.24)
GFR calc non Af Amer: >60 mL/min (ref >60)
Glucose, Bld: 139 mg/dL — ABNORMAL HIGH (ref 65–99)
Potassium: 3.5 mmol/L (ref 3.5–5.1)
SODIUM: 137 mmol/L (ref 135–145)

## 2014-06-28 LAB — GLUCOSE, CAPILLARY
GLUCOSE-CAPILLARY: 144 mg/dL — AB (ref 65–99)
GLUCOSE-CAPILLARY: 103 mg/dL — AB (ref 65–99)
Glucose-Capillary: 121 mg/dL — ABNORMAL HIGH (ref 65–99)

## 2014-06-28 LAB — URINE CULTURE
CULTURE: NO GROWTH
Colony Count: NO GROWTH

## 2014-06-28 LAB — MAGNESIUM: MAGNESIUM: 1.4 mg/dL — AB (ref 1.7–2.4)

## 2014-06-28 MED ORDER — SODIUM CHLORIDE 0.9 % IV BOLUS (SEPSIS): 250.0000 mL | Freq: Once | INTRAVENOUS | Status: DC

## 2014-06-28 MED ORDER — MAGNESIUM SULFATE 2 GM/50ML IV SOLN
2.0000 g | Freq: Once | INTRAVENOUS | Status: AC
  Administered 2014-06-28: 2 g via INTRAVENOUS
  Filled 2014-06-28: qty 50

## 2014-06-28 MED ORDER — SODIUM CHLORIDE 0.9 % IV SOLN
INTRAVENOUS | Status: DC
  Administered 2014-06-28 – 2014-07-04 (×5): via INTRAVENOUS
  Administered 2014-07-04: 1000 mL via INTRAVENOUS
  Administered 2014-07-05 – 2014-07-11 (×4): via INTRAVENOUS
  Administered 2014-07-12: 1000 mL via INTRAVENOUS
  Administered 2014-07-13: 15:00:00 via INTRAVENOUS

## 2014-06-28 MED ORDER — ATENOLOL 25 MG PO TABS
100.0000 mg | ORAL_TABLET | Freq: Every day | ORAL | Status: DC
  Administered 2014-06-29 – 2014-07-07 (×8): 100 mg via ORAL
  Filled 2014-06-28 (×13): qty 4

## 2014-06-28 MED ORDER — POTASSIUM CHLORIDE CRYS ER 20 MEQ PO TBCR
40.0000 meq | EXTENDED_RELEASE_TABLET | Freq: Once | ORAL | Status: AC
  Administered 2014-06-28: 40 meq via ORAL
  Filled 2014-06-28: qty 2

## 2014-06-28 NOTE — Progress Notes (Addendum)
[facility] PROGRESS NOTE  John Paul EAV:[id number] DOB: 08/02/1940 DOA: 06/28/2014 PCP: Eartha Inch, MD  Assessment/Plan: 1. Hypotension secondary to profound dehydration 1. Suspect secondary to profound diarrhea, per below 2. Responsive to IVF hydration 3. Hypotensive this AM, will d/c cardizem gtt and resume IVF 2. Fevers 1. Blood and urine cultures obtained, neg thus far 2. Urinalysis from 6/9 was unremarkable 3. Diarrhea possibly secondary to infectious gastroenteritis 1. Cdiff NEG 2. Stool pathogen neg 3. On empiric flagyl 4. Lactic acidosis 1. Much improved following IVF hydration 2. Cont to follow 5. Non-anion gap metabolic acidosis 1. Likely secondary to profound diarrhea 2. Stable 6. Hyponatremia 1. Resolved 7. Hyperkalemia 1. Resolved 8. Hypomagnesemia 1. Replaced on 6/11 but still low 2. Replace again today 9. Morbid obesity 1. Stable 10. DM2 1. On ssi coverage 2. Glucose has remained stable and controlled 3. Will f/u on a1c 11. Afib with RVR 1. Developed RVR on 6/11 and required cardizem 2. Rate now controlled 3. Cardizem stopped given hypotension 4. Home atenolol resumed with hold paremeters 12. OSA, not on CPAP 1. Stable 2. Per Critical Care, QHS BiPAP if needed 13. DVT prophylaxis 1. On Xarelto, renally dosed  Code Status: Full Family Communication: Pt in room Disposition Plan: Pending   Consultants:  Critical Care  Procedures:    Antibiotics:  Vancomycin 6/9>>>6/10  Zosyn 6/9>>>6/10  Flagyl 6/9>>>  HPI/Subjective: States feeling better. No complaints.  Objective: Filed Vitals:   06/28/14 1100 06/28/14 1200 06/28/14 1300 06/28/14 1400  BP: 92/61 95/51 104/54 100/49  Pulse: 86 84 86 86  Temp:  97.7 F (36.5 C)    TempSrc:  Oral    Resp: Height:      Weight:      SpO2: 98% 96% 98% 99%    Intake/Output Summary (Last 24 hours) at 06/28/14 1443 Last data filed at 06/28/14 1400  Gross per 24  hour  Intake   1910 ml  Output   1470 ml  Net    440 ml   Filed Weights   06/26/14 0146 06/27/14 0320 06/28/14 0400  Weight: 155.13 kg (342 lb) 156.037 kg (344 lb) 156.491 kg (345 lb)    Exam:   General:  Asleep, easily arousable, laying in bed, in nad  Cardiovascular: regular, s1, s2  Respiratory: normal resp effort, no wheezing  Abdomen: soft, obese, nondistended, pos BS  Musculoskeletal: perfused, no clubbing, no cyanosis  Data Reviewed: Basic Metabolic Panel:  Recent Labs Lab 07/15/2014 1540 07/13/2014 1655 06/26/14 0406 06/27/14 0403 06/28/14 0414  NA 134* 138 137 140 137  K 5.9* 4.5 4.5 3.9 3.5  CL 106 107 110 110 107  CO2  --  20* 19* 21* 21*  GLUCOSE 168* 131* 133* 141* 139*  BUN 64* 47* 37* 23* 17  CREATININE 2.00* 2.00* 1.57* 1.25* 0.95  CALCIUM  --  8.0* 7.9* 7.7* 7.4*  MG  --   --  1.6* 1.3* 1.4*  PHOS  --   --  2.9  --   --    Liver Function Tests:  Recent Labs Lab 06/21/2014 1653 06/27/14 0403  AST 18 16  ALT 15* 14*  ALKPHOS 71 66  BILITOT 0.8 0.4  PROT 6.1* 5.3*  ALBUMIN 2.8* 2.3*   No results for input(s): LIPASE, AMYLASE in the last 168 hours.  Recent Labs Lab 07/15/2014 1653  AMMONIA 11   CBC:  Recent Labs Lab 06/19/2014 1540 06/27/2014 1545 06/26/14 0406 06/28/14 0414  WBC  --  25.2* 19.8* 16.4*  HGB 16.3 14.2 12.2* 12.5*  HCT 48.0 43.3 37.2* 37.9*  MCV  --  94.5 96.1 95.7  PLT  --  292 271 327   Cardiac Enzymes:  Recent Labs Lab 2014-07-23 2241 06/26/14 0406 06/26/14 1220  TROPONINI <0.03 <0.03 <0.03   BNP (last 3 results)  Recent Labs  2014/07/23 1546  BNP 92.8    ProBNP (last 3 results) No results for input(s): PROBNP in the last 8760 hours.  CBG:  Recent Labs Lab 06/27/14 0752 06/27/14 1154 06/27/14 1618 06/27/14 2143 06/28/14 0806  GLUCAP 151* 123* 117* 150* 144*    Recent Results (from the past 240 hour(s))  Blood culture (routine x 2)     Status: None (Preliminary result)   Collection Time:  Jul 23, 2014  3:28 PM  Result Value Ref Range Status   Specimen Description BLOOD RIGHT ARM  Final   Special Requests BOTTLES DRAWN AEROBIC AND ANAEROBIC 5CC EACH  Final   Culture   Final           BLOOD CULTURE RECEIVED NO GROWTH TO DATE CULTURE WILL BE HELD FOR 5 DAYS BEFORE ISSUING A FINAL NEGATIVE REPORT Performed at [facility]    Report Status PENDING  Incomplete  Blood culture (routine x 2)     Status: None (Preliminary result)   Collection Time: 07-23-14  3:29 PM  Result Value Ref Range Status   Specimen Description BLOOD LEFT ARM  Final   Special Requests BOTTLES DRAWN AEROBIC AND ANAEROBIC 5CC EACH  Final   Culture   Final           BLOOD CULTURE RECEIVED NO GROWTH TO DATE CULTURE WILL BE HELD FOR 5 DAYS BEFORE ISSUING A FINAL NEGATIVE REPORT Performed at [facility]    Report Status PENDING  Incomplete  Urine culture     Status: None   Collection Time: 2014-07-23  5:46 PM  Result Value Ref Range Status   Specimen Description Urine  Final   Special Requests Normal  Final   Colony Count NO GROWTH Performed at [facility]   Final   Culture NO GROWTH Performed at [facility]   Final   Report Status 06/26/2014 FINAL  Final  MRSA PCR Screening     Status: None   Collection Time: 07-23-2014 10:01 PM  Result Value Ref Range Status   MRSA by PCR NEGATIVE NEGATIVE Final    Comment:        The GeneXpert MRSA Assay (FDA approved for NASAL specimens only), is one component of a comprehensive MRSA colonization surveillance program. It is not intended to diagnose MRSA infection nor to guide or monitor treatment for MRSA infections.   Culture, Urine     Status: None   Collection Time: 06/26/14  1:45 AM  Result Value Ref Range Status   Specimen Description URINE, CATHETERIZED  Final   Special Requests NONE  Final   Colony Count NO GROWTH Performed at [facility]   Final   Culture NO GROWTH Performed at [facility]   Final    Report Status 06/27/2014 FINAL  Final  Culture, blood (routine x 2)     Status: None (Preliminary result)   Collection Time: 06/27/14 12:47 PM  Result Value Ref Range Status   Specimen Description BLOOD LEFT ARM  Final   Special Requests BOTTLES DRAWN AEROBIC ONLY 10CC AEROBIC ONLY  Final   Culture   Final  BLOOD CULTURE RECEIVED NO GROWTH TO DATE CULTURE WILL BE HELD FOR 5 DAYS BEFORE ISSUING A FINAL NEGATIVE REPORT Performed at [facility]    Report Status PENDING  Incomplete  Culture, blood (routine x 2)     Status: None (Preliminary result)   Collection Time: 06/27/14 12:54 PM  Result Value Ref Range Status   Specimen Description BLOOD LEFT ARM  Final   Special Requests   Final    BOTTLES DRAWN AEROBIC AND ANAEROBIC 10CC BOTH BOTTLES   Culture   Final           BLOOD CULTURE RECEIVED NO GROWTH TO DATE CULTURE WILL BE HELD FOR 5 DAYS BEFORE ISSUING A FINAL NEGATIVE REPORT Performed at [facility]    Report Status PENDING  Incomplete  Clostridium Difficile by PCR (not at [facility])     Status: None   Collection Time: 06/27/14  6:00 PM  Result Value Ref Range Status   C difficile by pcr NEGATIVE NEGATIVE Final     Studies: Dg Chest Port 1 View  06/27/2014   CLINICAL DATA:  Shortness of breath with diarrhea intermittently for 2 days. History of diabetes and obesity. Initial encounter.  EXAM: PORTABLE CHEST - 1 VIEW  COMPARISON:  04/14/2005 and Jul 10, 2014 radiographs.  FINDINGS: 1035 hours. Stable cardiomegaly and mild chronic vascular congestion. No edema, confluent airspace opacity or pleural effusion. The bones appear unchanged. Telemetry leads overlie the chest.  IMPRESSION: Stable chest with mild cardiomegaly.  No acute findings demonstrated   Electronically Signed   By: Carey Bullocks M.D.   On: 06/27/2014 12:21   Dg Abd Portable 1v  06/27/2014   CLINICAL DATA:  Shortness of breath with intermittent diarrhea for 2 days. History of hypertension and  obesity. Initial encounter.  EXAM: PORTABLE ABDOMEN - 1 VIEW  COMPARISON:  None.  FINDINGS: Examination is limited by body habitus and breathing artifact. The visualized bowel gas pattern is nonobstructive. There is no supine evidence of free intraperitoneal air or bowel wall thickening. There are no suspicious abdominal calcifications. Degenerative changes are present throughout the visualized spine.  IMPRESSION: No acute findings or explanation for the patient's symptoms. Examination is limited by body habitus and breathing artifact.   Electronically Signed   By: Carey Bullocks M.D.   On: 06/27/2014 12:23    Scheduled Meds: . antiseptic oral rinse  7 mL Mouth Rinse q12n4p  . atenolol  100 mg Oral Daily  . atorvastatin  10 mg Oral QHS  . chlorhexidine  15 mL Mouth Rinse BID  . insulin aspart  0-20 Units Subcutaneous TID WC  . insulin aspart  0-5 Units Subcutaneous QHS  . metronidazole  500 mg Intravenous Q8H  . nystatin  5 mL Oral QID  . nystatin   Topical TID  . rivaroxaban  20 mg Oral Daily  . sodium chloride  1,000 mL Intravenous Once  . sodium chloride  250 mL Intravenous Once   Continuous Infusions: . sodium chloride 10 mL/hr at 06/26/14 1739  . sodium chloride 100 mL/hr at 06/28/14 1307  . diltiazem (CARDIZEM) infusion 5 mg/hr (06/27/14 1900)    Principal Problem:   Dehydration, severe Active Problems:   Atrial fibrillation   Morbid obesity   Hypertension   Diabetes mellitus type 2, noninsulin dependent   Hypotension   Acute kidney injury   Pressure ulcer   Diarrhea   Hyperkalemia   Hyponatremia   Metabolic acidosis with normal anion gap and bicarbonate losses  Lactic acidosis   Thursa Emme K  [facility] Pager [phone]. If 7PM-7AM, please contact night-coverage at [email or web address], password [facility] 06/28/2014, 2:43 PM  LOS: 3 days

## 2014-06-29 DIAGNOSIS — I480 Paroxysmal atrial fibrillation: Secondary | ICD-10-CM

## 2014-06-29 DIAGNOSIS — E860 Dehydration: Secondary | ICD-10-CM

## 2014-06-29 DIAGNOSIS — I4821 Permanent atrial fibrillation: Secondary | ICD-10-CM | POA: Diagnosis present

## 2014-06-29 DIAGNOSIS — Z7901 Long term (current) use of anticoagulants: Secondary | ICD-10-CM

## 2014-06-29 LAB — CBC
HEMATOCRIT: 37.9 % — AB (ref 39.0–52.0)
Hemoglobin: 12.3 g/dL — ABNORMAL LOW (ref 13.0–17.0)
MCH: 31.1 pg (ref 26.0–34.0)
MCHC: 32.5 g/dL (ref 30.0–36.0)
MCV: 95.7 fL (ref 78.0–100.0)
PLATELETS: 333 10*3/uL (ref 150–400)
RBC: 3.96 MIL/uL — ABNORMAL LOW (ref 4.22–5.81)
RDW: 14.1 % (ref 11.5–15.5)
WBC: 17.2 10*3/uL — AB (ref 4.0–10.5)

## 2014-06-29 LAB — GLUCOSE, CAPILLARY
GLUCOSE-CAPILLARY: 141 mg/dL — AB (ref 65–99)
Glucose-Capillary: 144 mg/dL — ABNORMAL HIGH (ref 65–99)
Glucose-Capillary: 130 mg/dL — ABNORMAL HIGH (ref 65–99)
Glucose-Capillary: 136 mg/dL — ABNORMAL HIGH (ref 65–99)
Glucose-Capillary: 140 mg/dL — ABNORMAL HIGH (ref 65–99)

## 2014-06-29 LAB — PROCALCITONIN: Procalcitonin: 0.72 ng/mL

## 2014-06-29 LAB — HEMOGLOBIN A1C
Hgb A1c MFr Bld: 6 % — ABNORMAL HIGH (ref 4.8–5.6)
MEAN PLASMA GLUCOSE: 126 mg/dL

## 2014-06-29 LAB — BASIC METABOLIC PANEL
ANION GAP: 6 (ref 5–15)
BUN: 17 mg/dL (ref 6–20)
CHLORIDE: 107 mmol/L (ref 101–111)
CO2: 22 mmol/L (ref 22–32)
Calcium: 7.3 mg/dL — ABNORMAL LOW (ref 8.9–10.3)
Creatinine, Ser: 1.04 mg/dL (ref 0.61–1.24)
GFR calc Af Amer: >60 mL/min (ref >60)
GFR calc non Af Amer: >60 mL/min (ref >60)
GLUCOSE: 155 mg/dL — AB (ref 65–99)
POTASSIUM: 3.8 mmol/L (ref 3.5–5.1)
SODIUM: 135 mmol/L (ref 135–145)

## 2014-06-29 LAB — MAGNESIUM: Magnesium: 1.5 mg/dL — ABNORMAL LOW (ref 1.7–2.4)

## 2014-06-29 MED ORDER — DIPHENOXYLATE-ATROPINE 2.5-0.025 MG PO TABS
1.0000 | ORAL_TABLET | Freq: Four times a day (QID) | ORAL | Status: DC | PRN
  Administered 2014-07-10: 1 via ORAL
  Filled 2014-06-29: qty 1

## 2014-06-29 MED ORDER — DIPHENOXYLATE-ATROPINE 2.5-0.025 MG PO TABS
2.0000 | ORAL_TABLET | Freq: Once | ORAL | Status: AC
Start: 2014-06-29 — End: 2014-06-29
  Administered 2014-06-29: 2 via ORAL
  Filled 2014-06-29: qty 2

## 2014-06-29 MED ORDER — DILTIAZEM LOAD VIA INFUSION
5.0000 mg | Freq: Once | INTRAVENOUS | Status: AC
Start: 2014-06-29 — End: 2014-06-29
  Administered 2014-06-29: 5 mg via INTRAVENOUS
  Filled 2014-06-29: qty 5

## 2014-06-29 NOTE — Progress Notes (Addendum)
[facility] NP paged regarding increased and sustaining resting HR in 110s/120s. Cardizem bolus ordered. HR remaining in 120s/130s, Cardizem gtt started and to be held for <90 SBP per Claiborne Billings, NP.

## 2014-06-29 NOTE — Progress Notes (Addendum)
[facility] PROGRESS NOTE  John Paul BJY:[id number] DOB: 09-20-1940 DOA: 07/10/2014 PCP: Eartha Inch, MD  Assessment/Plan: 1. Hypotension secondary to profound dehydration 1. Suspect secondary to profound diarrhea, per below 2. BP has been fluctuating- Cardizem stopped yesterday due to hypotension- Cardizem resumed overnight due to A-fib with RVR- cont to follow BP closely   2. Afib with RVR 1. Developed RVR on 6/11 and required cardizem which was stopped on 6/12 once rate was controlled- home dose of Atenolol could not be given due to hypotension 2.  went back in to A-fib overnight and back on Cardizem infusion- will ask for cardiology assistance and BP is borderline  3. Fevers/ leukocytosis 1. 2 sets of Blood and urine cultures obtained, neg thus far 2. Urinalysis from 6/9 was unremarkable 3. Last documented fever was on 6/11- 101.3 4. Now prior blood work to compare with in regards to elevated WBC count which has not yet normalized  4. Diarrhea possibly secondary to infectious gastroenteritis 1. Cdiff NEG 2. Stool pathogen pending 3. On empiric flagyl- will d/c as C diff negative 4. Diarrhea has completely resolved  5. Lactic acidosis 1. normalized following IVF hydration  6. Non-anion gap metabolic acidosis 1. Likely secondary to profound diarrhea- resolved  7. Hyponatremia 1. Resolved  8. Hyperkalemia 1. Resolved  9. Hypomagnesemia 1. Replaced on 6/11 but still low on 6/12 and replaced again  10. Morbid obesity 1. Stable  11. DM2 1. On ssi coverage 2. Glucose has remained stable and controlled 3. Will f/u on a1c  12. OSA, not on CPAP 1. Stable 2. Per Critical Care, QHS BiPAP if needed  13. DVT prophylaxis 1. On Xarelto, renally dosed  Code Status: Full Family Communication: Pt in room Disposition Plan: Pending   Consultants:  Critical Care  Procedures:    Antibiotics:  Vancomycin 6/9>>>6/10  Zosyn 6/9>>>6/10  Flagyl  6/9>>>6/13  HPI/Subjective: Appetite poor- had Bowel movements last night with were normal- currently no complaints.   Objective: Filed Vitals:   06/29/14 0400 06/29/14 0412 06/29/14 0500 06/29/14 0600  BP: 98/60  92/52 100/53  Pulse: 129  115 77  Temp:  98.6 F (37 C)    TempSrc:  Oral    Resp: 27  21 23   Height:      Weight:   158.759 kg (350 lb)   SpO2: 96%  97% 93%    Intake/Output Summary (Last 24 hours) at 06/29/14 0817 Last data filed at 06/29/14 0600  Gross per 24 hour  Intake 2937.5 ml  Output   1145 ml  Net 1792.5 ml   Filed Weights   06/27/14 0320 06/28/14 0400 06/29/14 0500  Weight: 156.037 kg (344 lb) 156.491 kg (345 lb) 158.759 kg (350 lb)    Exam:   General: awake and alert without distress  Cardiovascular: IIRR- HR in low 100s  Respiratory: normal resp effort, no wheezing  Abdomen: soft, obese, nondistended, pos BS  Musculoskeletal: perfused, no clubbing, no cyanosis  Data Reviewed: Basic Metabolic Panel:  Recent Labs Lab 06/19/2014 1655 06/26/14 0406 06/27/14 0403 06/28/14 0414 06/29/14 0348  NA 138 137 140 137 135  K 4.5 4.5 3.9 3.5 3.8  CL 107 110 110 107 107  CO2 20* 19* 21* 21* 22  GLUCOSE 131* 133* 141* 139* 155*  BUN 47* 37* 23* 17 17  CREATININE 2.00* 1.57* 1.25* 0.95 1.04  CALCIUM 8.0* 7.9* 7.7* 7.4* 7.3*  MG  --  1.6* 1.3* 1.4*  --   PHOS  --  2.9  --   --   --    Liver Function Tests:  Recent Labs Lab 07/08/2014 1653 06/27/14 0403  AST 18 16  ALT 15* 14*  ALKPHOS 71 66  BILITOT 0.8 0.4  PROT 6.1* 5.3*  ALBUMIN 2.8* 2.3*   No results for input(s): LIPASE, AMYLASE in the last 168 hours.  Recent Labs Lab 07/04/2014 1653  AMMONIA 11   CBC:  Recent Labs Lab 06/22/2014 1540 07/12/2014 1545 06/26/14 0406 06/28/14 0414 06/29/14 0348  WBC  --  25.2* 19.8* 16.4* 17.2*  HGB 16.3 14.2 12.2* 12.5* 12.3*  HCT 48.0 43.3 37.2* 37.9* 37.9*  MCV  --  94.5 96.1 95.7 95.7  PLT  --  292 271 327 333   Cardiac  Enzymes:  Recent Labs Lab 06/26/2014 2241 06/26/14 0406 06/26/14 1220  TROPONINI <0.03 <0.03 <0.03   BNP (last 3 results)  Recent Labs  07/09/2014 1546  BNP 92.8    ProBNP (last 3 results) No results for input(s): PROBNP in the last 8760 hours.  CBG:  Recent Labs Lab 06/27/14 2143 06/28/14 0806 06/28/14 1234 06/28/14 1717 06/28/14 2133  GLUCAP 150* 144* 121* 103* 141*    Recent Results (from the past 240 hour(s))  Blood culture (routine x 2)     Status: None (Preliminary result)   Collection Time: 06/21/2014  3:28 PM  Result Value Ref Range Status   Specimen Description BLOOD RIGHT ARM  Final   Special Requests BOTTLES DRAWN AEROBIC AND ANAEROBIC 5CC EACH  Final   Culture   Final           BLOOD CULTURE RECEIVED NO GROWTH TO DATE CULTURE WILL BE HELD FOR 5 DAYS BEFORE ISSUING A FINAL NEGATIVE REPORT Performed at [facility]    Report Status PENDING  Incomplete  Blood culture (routine x 2)     Status: None (Preliminary result)   Collection Time: 07/15/2014  3:29 PM  Result Value Ref Range Status   Specimen Description BLOOD LEFT ARM  Final   Special Requests BOTTLES DRAWN AEROBIC AND ANAEROBIC 5CC EACH  Final   Culture   Final           BLOOD CULTURE RECEIVED NO GROWTH TO DATE CULTURE WILL BE HELD FOR 5 DAYS BEFORE ISSUING A FINAL NEGATIVE REPORT Performed at [facility]    Report Status PENDING  Incomplete  Urine culture     Status: None   Collection Time: 06/20/2014  5:46 PM  Result Value Ref Range Status   Specimen Description Urine  Final   Special Requests Normal  Final   Colony Count NO GROWTH Performed at [facility]   Final   Culture NO GROWTH Performed at [facility]   Final   Report Status 06/26/2014 FINAL  Final  MRSA PCR Screening     Status: None   Collection Time: 06/21/2014 10:01 PM  Result Value Ref Range Status   MRSA by PCR NEGATIVE NEGATIVE Final    Comment:        The GeneXpert MRSA Assay (FDA approved  for NASAL specimens only), is one component of a comprehensive MRSA colonization surveillance program. It is not intended to diagnose MRSA infection nor to guide or monitor treatment for MRSA infections.   Culture, Urine     Status: None   Collection Time: 06/26/14  1:45 AM  Result Value Ref Range Status   Specimen Description URINE, CATHETERIZED  Final   Special Requests NONE  Final   Colony Count NO GROWTH Performed at [facility]   Final   Culture NO GROWTH Performed at [facility]   Final   Report Status 06/27/2014 FINAL  Final  Culture, Urine     Status: None   Collection Time: 06/27/14 11:51 AM  Result Value Ref Range Status   Specimen Description URINE, CATHETERIZED  Final   Special Requests NONE  Final   Colony Count NO GROWTH Performed at [facility]   Final   Culture NO GROWTH Performed at [facility]   Final   Report Status 06/28/2014 FINAL  Final  Culture, blood (routine x 2)     Status: None (Preliminary result)   Collection Time: 06/27/14 12:47 PM  Result Value Ref Range Status   Specimen Description BLOOD LEFT ARM  Final   Special Requests BOTTLES DRAWN AEROBIC ONLY 10CC AEROBIC ONLY  Final   Culture   Final           BLOOD CULTURE RECEIVED NO GROWTH TO DATE CULTURE WILL BE HELD FOR 5 DAYS BEFORE ISSUING A FINAL NEGATIVE REPORT Performed at [facility]    Report Status PENDING  Incomplete  Culture, blood (routine x 2)     Status: None (Preliminary result)   Collection Time: 06/27/14 12:54 PM  Result Value Ref Range Status   Specimen Description BLOOD LEFT ARM  Final   Special Requests   Final    BOTTLES DRAWN AEROBIC AND ANAEROBIC 10CC BOTH BOTTLES   Culture   Final           BLOOD CULTURE RECEIVED NO GROWTH TO DATE CULTURE WILL BE HELD FOR 5 DAYS BEFORE ISSUING A FINAL NEGATIVE REPORT Performed at [facility]    Report Status PENDING  Incomplete  Clostridium Difficile by PCR (not at [facility])      Status: None   Collection Time: 06/27/14  6:00 PM  Result Value Ref Range Status   C difficile by pcr NEGATIVE NEGATIVE Final     Studies: Dg Chest Port 1 View  06/27/2014   CLINICAL DATA:  Shortness of breath with diarrhea intermittently for 2 days. History of diabetes and obesity. Initial encounter.  EXAM: PORTABLE CHEST - 1 VIEW  COMPARISON:  04/14/2005 and July 08, 2014 radiographs.  FINDINGS: 1035 hours. Stable cardiomegaly and mild chronic vascular congestion. No edema, confluent airspace opacity or pleural effusion. The bones appear unchanged. Telemetry leads overlie the chest.  IMPRESSION: Stable chest with mild cardiomegaly.  No acute findings demonstrated   Electronically Signed   By: Carey Bullocks M.D.   On: 06/27/2014 12:21   Dg Abd Portable 1v  06/27/2014   CLINICAL DATA:  Shortness of breath with intermittent diarrhea for 2 days. History of hypertension and obesity. Initial encounter.  EXAM: PORTABLE ABDOMEN - 1 VIEW  COMPARISON:  None.  FINDINGS: Examination is limited by body habitus and breathing artifact. The visualized bowel gas pattern is nonobstructive. There is no supine evidence of free intraperitoneal air or bowel wall thickening. There are no suspicious abdominal calcifications. Degenerative changes are present throughout the visualized spine.  IMPRESSION: No acute findings or explanation for the patient's symptoms. Examination is limited by body habitus and breathing artifact.   Electronically Signed   By: Carey Bullocks M.D.   On: 06/27/2014 12:23    Scheduled Meds: . antiseptic oral rinse  7 mL Mouth Rinse q12n4p  . atenolol  100 mg Oral Daily  . atorvastatin  10 mg  Oral QHS  . chlorhexidine  15 mL Mouth Rinse BID  . insulin aspart  0-20 Units Subcutaneous TID WC  . insulin aspart  0-5 Units Subcutaneous QHS  . nystatin  5 mL Oral QID  . nystatin   Topical TID  . rivaroxaban  20 mg Oral Daily  . sodium chloride  1,000 mL Intravenous Once  . sodium chloride  250 mL  Intravenous Once   Continuous Infusions: . sodium chloride 10 mL/hr at 06/26/14 1739  . sodium chloride 100 mL/hr at 06/28/14 1629  . diltiazem (CARDIZEM) infusion 7.5 mg/hr (06/29/14 0600)      [facility]  [facility] Pager [phone]. If 7PM-7AM, please contact night-coverage at [email or web address], password [facility] 06/29/2014, 8:17 AM  LOS: 4 days

## 2014-06-29 NOTE — Progress Notes (Signed)
Date:  June 29, 2014 U.R. performed for needs and level of care. Remains on iv Cardizem drip for a.fib with rvr Will continue to follow for Case Management needs.  Marcelle Smiling, RN, BSN, [place]   [phone]

## 2014-06-29 NOTE — Progress Notes (Signed)
Called by RN for watery stool. Start Lomotil.

## 2014-06-29 NOTE — Consult Note (Signed)
Reason for Consult:   AF with RVR  Requesting Physician: [facility] Primary Cardiologist Dr Swaziland  HPI: This is a 74 y.o. male followed by Dr Swaziland with a past medical history significant for morbid obesity (BMI 49), sleep apnea (not on C-pap), DM, HTN,and permanent atrial fibrillation on Xarelto. He was admitted 07/03/14 with severe dehydration after 4 days of diarrhea. An infectious agent has not yet been identified. He developed rapid AF on 6/11 requiring IV Diltiazem This later had to be stopped because of low B/P, ( pt actually had 2:1 flutter on 6/10 as well).  Last night he had recurrent AF with RVR and was again started on IV Diltiazem. His rate improved but his B/P dropped into the low 90's and this was stopped, though the RN did give him his home dose of Tenormin 100 mg before stopping the Diltiazem. When I saw him this am he was comfortable, unaware of AF (rate in the 80's).  PMHx:  Past Medical History  Diagnosis Date  . Diabetes mellitus   . Hypertension   . Hyperlipidemia   . Morbid obesity     WITH HYPOVENTILATION  . OSA (obstructive sleep apnea)   . Atrial fibrillation     Past Surgical History  Procedure Laterality Date  . Vasectomy    . US echocardiography  02/12/2009    EF 55-60%    SOCHx:  reports that he quit smoking about 43 years ago. He does not have any smokeless tobacco history on file. He reports that he does not drink alcohol or use illicit drugs.  FAMHx: Family History  Problem Relation Age of Onset  . Clotting disorder Father     ALLERGIES: No Known Allergies  ROS: Pertinent items are noted in HPI. see H&P for complete ROS  HOME MEDICATIONS: Prior to Admission medications   Medication Sig Start Date End Date Taking? Authorizing Provider  Alcohol Swabs (B-D SINGLE USE SWABS REGULAR) PADS  06/19/14  Yes Historical Provider, MD  atenolol (TENORMIN) 100 MG tablet  01/02/14  Yes Historical Provider, MD  docusate sodium (COLACE)  100 MG capsule Take 100 mg by mouth 2 (two) times daily.   Yes Historical Provider, MD  metFORMIN (GLUCOPHAGE-XR) 500 MG 24 hr tablet Take 500 mg by mouth 2 (two) times daily. 01/15/14  Yes Historical Provider, MD  Multiple Vitamin (MULTIVITAMIN) tablet Take 1 tablet by mouth daily.     Yes Historical Provider, MD  OVER THE COUNTER MEDICATION 1 tablet daily as needed (Sudafed- strength unknown).   Yes Historical Provider, MD  quinapril (ACCUPRIL) 20 MG tablet  01/02/14  Yes Historical Provider, MD  rivaroxaban (XARELTO) 20 MG TABS tablet Take 1 tablet (20 mg total) by mouth daily. 01/13/14  Yes Peter M Swaziland, MD  simvastatin (ZOCOR) 20 MG tablet Take 20 mg by mouth at bedtime.     Yes Historical Provider, MD  traMADol Janean Sark) 50 MG tablet  07/30/10  Yes Historical Provider, MD  triamterene-hydrochlorothiazide (DYAZIDE) 37.5-25 MG per capsule Take 1 capsule by mouth every morning.     Yes Historical Provider, MD  TRUE METRIX BLOOD GLUCOSE TEST test strip  06/19/14  Yes Historical Provider, MD  glyBURIDE-metformin (GLUCOVANCE) 2.5-500 MG per tablet Take 1 tablet by mouth 2 (two) times daily with a meal.      Historical Provider, MD    HOSPITAL MEDICATIONS: I have reviewed the patient's current medications.  VITALS: Blood pressure 123/73, pulse 88, temperature 97.8  F (36.6 C), temperature source Oral, resp. rate 24, height  (1.778 m), weight 350 lb (158.759 kg), SpO2 92 %.  PHYSICAL EXAM: General appearance: alert, cooperative, no distress and morbidly obese Neck: no carotid bruit and no JVD Lungs: clear to auscultation bilaterally Heart: irregularly irregular rhythm Abdomen: morbid obesity, mild RUQ tenderness Extremities: chronic venous changes Pulses: diminnished Skin: pale, cool, dry Neurologic: Grossly normal  LABS: Results for orders placed or performed during the hospital encounter of 06/28/2014 (from the past 24 hour(s))  Glucose, capillary     Status: Abnormal   Collection  Time: 06/28/14 12:34 PM  Result Value Ref Range   Glucose-Capillary 121 (H) 65 - 99 mg/dL   Comment 1 Notify RN    Comment 2 Document in Chart   Glucose, capillary     Status: Abnormal   Collection Time: 06/28/14  5:17 PM  Result Value Ref Range   Glucose-Capillary 103 (H) 65 - 99 mg/dL  Glucose, capillary     Status: Abnormal   Collection Time: 06/28/14  9:33 PM  Result Value Ref Range   Glucose-Capillary 141 (H) 65 - 99 mg/dL  Procalcitonin     Status: None   Collection Time: 06/29/14  3:48 AM  Result Value Ref Range   Procalcitonin 0.72 ng/mL  Basic metabolic panel     Status: Abnormal   Collection Time: 06/29/14  3:48 AM  Result Value Ref Range   Sodium 135 135 - 145 mmol/L   Potassium 3.8 3.5 - 5.1 mmol/L   Chloride 107 101 - 111 mmol/L   CO2 22 22 - 32 mmol/L   Glucose, Bld 155 (H) 65 - 99 mg/dL   BUN 17 6 - 20 mg/dL   Creatinine, Ser 1.61 0.61 - 1.24 mg/dL   Calcium 7.3 (L) 8.9 - 10.3 mg/dL   GFR calc non Af Amer >60 >60 mL/min   GFR calc Af Amer >60 >60 mL/min   Anion gap 6 5 - 15  CBC     Status: Abnormal   Collection Time: 06/29/14  3:48 AM  Result Value Ref Range   WBC 17.2 (H) 4.0 - 10.5 K/uL   RBC 3.96 (L) 4.22 - 5.81 MIL/uL   Hemoglobin 12.3 (L) 13.0 - 17.0 g/dL   HCT 09.6 (L) 04.5 - 40.9 %   MCV 95.7 78.0 - 100.0 fL   MCH 31.1 26.0 - 34.0 pg   MCHC 32.5 30.0 - 36.0 g/dL   RDW 81.1 91.4 - 78.2 %   Platelets 333 150 - 400 K/uL  Magnesium     Status: Abnormal   Collection Time: 06/29/14  3:48 AM  Result Value Ref Range   Magnesium 1.5 (L) 1.7 - 2.4 mg/dL    EKG: This am read as 3:1 A flutter Echo 2011- EF 55-60%  IMPRESSION: Principal Problem:   Dehydration, severe Active Problems:   Atrial fibrillation with RVR   Hypotension   Diarrhea   Diabetes mellitus type 2, noninsulin dependent   Acute kidney injury   Hyperkalemia   Hyponatremia   Metabolic acidosis with normal anion gap and bicarbonate losses   Lactic acidosis   Chronic  anticoagulation-Xarelto   Permanent atrial fibrillation   Morbid obesity   Hypertension   Pressure ulcer   RECOMMENDATION: Currently rate controlled and B/P stable. Will review with meds with MD. Consider changing Tenormin to BID Metoprolol.  Document TSH. Could consider Amiodarone for rate control if B/P didn't allow use of Diltiazem or beta blocker.  Time Spent Directly with Patient:  40 minutes  Abelino Derrick [phone] beeper 06/29/2014, 10:57 AM   Patient seen with PA, agree with the above note. We were consulted for atrial flutter with hypotension/tachycardia.  He is now off diltiazem gtt and got his home dose of atenolol.  HR is in the 80s-90s currently with stable blood pressure.  Would not make any changes currently, he is stable from cardiac perspective at this time.  Continue atenolol.  We will follow while here.  He is on Xarelto.  Has history of paroxysmal atrial fibrillation.  Could consider DCCV in future if he does not come out of atrial flutter but would let him recover from current gastroenteritis first.   Marca Ancona 06/29/2014 1:52 PM

## 2014-06-30 DIAGNOSIS — Z7901 Long term (current) use of anticoagulants: Secondary | ICD-10-CM

## 2014-06-30 LAB — GI PATHOGEN PANEL BY PCR, STOOL
C difficile toxin A/B: NOT DETECTED
CRYPTOSPORIDIUM BY PCR: NOT DETECTED
Campylobacter by PCR: NOT DETECTED
E COLI (ETEC) LT/ST: NOT DETECTED
E COLI 0157 BY PCR: NOT DETECTED
E coli (STEC): NOT DETECTED
G lamblia by PCR: NOT DETECTED
Norovirus GI/GII: NOT DETECTED
Rotavirus A by PCR: NOT DETECTED
SHIGELLA BY PCR: NOT DETECTED
Salmonella by PCR: NOT DETECTED

## 2014-06-30 LAB — GLUCOSE, CAPILLARY
Glucose-Capillary: 121 mg/dL — ABNORMAL HIGH (ref 65–99)
Glucose-Capillary: 125 mg/dL — ABNORMAL HIGH (ref 65–99)
Glucose-Capillary: 164 mg/dL — ABNORMAL HIGH (ref 65–99)

## 2014-06-30 MED ORDER — MAGNESIUM SULFATE 2 GM/50ML IV SOLN
2.0000 g | Freq: Once | INTRAVENOUS | Status: AC
  Administered 2014-06-30: 2 g via INTRAVENOUS
  Filled 2014-06-30: qty 50

## 2014-06-30 NOTE — Progress Notes (Addendum)
[facility] PROGRESS NOTE  John Paul ZOX:[id number] DOB: 09-Jul-1940 DOA: June 29, 2014 PCP: Eartha Inch, MD  Off Service Summary: 73yo who presented with marked diarrhea prior to admission with resultant profound hypotension. Pt was initially admitted to ICU service where his bp was ultimately stabilized with aggressive IVF hydration. Cdiff was neg and stool cultures remain pending. The patient's diarrhea gradually improved. During this course, the patient encountered periods of afib with RVR requiring cardizem gtt. The patient has since been continued on his home atenolol with improvement in rate control.   Assessment/Plan: 1. Hypotension secondary to profound dehydration 1. Likely secondary to profound diarrhea, per below 2. BP had been fluctuating- Cardizem stopped yesterday due to hypotension 3. Pt had been cardizem dependent recently, since d/c'd - see below 4. Will decrease IVF to basal 75cc/hr  2. Afib with RVR 1. Developed RVR on 6/11 and required cardizem which was stopped on 6/12 once rate was controlled- home dose of Atenolol could not be given due to hypotension 2.  went back in to A-fib on evening of 6/13 and back on Cardizem infusion 3. Cardiology was consulted and pt recommended to continue home atenolol. Cardizem d/c'd  3. Fevers/ leukocytosis 1. 2 sets of Blood and urine cultures obtained, neg thus far 2. Urinalysis from 6/9 was unremarkable 3. Last documented fever was on 6/11- 101.3  4. Diarrhea possibly secondary to infectious gastroenteritis 1. Cdiff NEG 2. Stool pathogen still pending 3. Pt had been on empiric flagyl, since d/c'd as C diff negative 4. Diarrhea has completely resolved  5. Lactic acidosis 1. normalized following IVF hydration  6. Non-anion gap metabolic acidosis 1. Likely secondary to profound diarrhea- resolved  7. Hyponatremia 1. Resolved  8. Hyperkalemia 1. Resolved  9. Hypomagnesemia 1. Replaced on 6/11 but still low on  6/12 and replaced again  10. Morbid obesity 1. Stable  11. DM2 1. On ssi coverage 2. Glucose has remained stable and controlled 3. A1c of 6.0  12. OSA, not on CPAP 1. Stable 2. Per Critical Care, QHS BiPAP if needed  13. DVT prophylaxis 1. On Xarelto, renally dosed  14. Debility 1. Consult PT/OT for d/c planning  Code Status: Full Family Communication: Pt in room Disposition Plan: Pending   Consultants:  Critical Care  Procedures:    Antibiotics:  Vancomycin 6/9>>>6/10  Zosyn 6/9>>>6/10  Flagyl 6/9>>>6/13  HPI/Subjective: States feeling better today. Complains of feeling weak  Objective: Filed Vitals:   06/30/14 0405 06/30/14 0800 06/30/14 1000 06/30/14 1200  BP: 106/61 105/63 118/68   Pulse: 51 103 90   Temp: 99.1 F (37.3 C) 98.1 F (36.7 C)  99.1 F (37.3 C)  TempSrc: Oral Oral  Oral  Resp: Height:      Weight: 157.398 kg (347 lb)     SpO2: 92% 96% 95%     Intake/Output Summary (Last 24 hours) at 06/30/14 1541 Last data filed at 06/30/14 1300  Gross per 24 hour  Intake   2655 ml  Output   1175 ml  Net   1480 ml   Filed Weights   06/28/14 0400 06/29/14 0500 06/30/14 0405  Weight: 156.491 kg (345 lb) 158.759 kg (350 lb) 157.398 kg (347 lb)    Exam:   General: awake and alert, in nad  Cardiovascular: regular, s1, s2  Respiratory: normal resp effort, no wheezing  Abdomen: soft, obese, nondistended, pos BS  Musculoskeletal: perfused, no clubbing, no cyanosis  Data Reviewed: Basic Metabolic Panel:  Recent  Labs Lab 06/23/2014 1655 06/26/14 0406 06/27/14 0403 06/28/14 0414 06/29/14 0348  NA 138 137 140 137 135  K 4.5 4.5 3.9 3.5 3.8  CL 107 110 110 107 107  CO2 20* 19* 21* 21* 22  GLUCOSE 131* 133* 141* 139* 155*  BUN 47* 37* 23* 17 17  CREATININE 2.00* 1.57* 1.25* 0.95 1.04  CALCIUM 8.0* 7.9* 7.7* 7.4* 7.3*  MG  --  1.6* 1.3* 1.4* 1.5*  PHOS  --  2.9  --   --   --    Liver Function Tests:  Recent  Labs Lab 06/22/2014 1653 06/27/14 0403  AST 18 16  ALT 15* 14*  ALKPHOS 71 66  BILITOT 0.8 0.4  PROT 6.1* 5.3*  ALBUMIN 2.8* 2.3*   No results for input(s): LIPASE, AMYLASE in the last 168 hours.  Recent Labs Lab 07/09/2014 1653  AMMONIA 11   CBC:  Recent Labs Lab 06/26/2014 1540 07/04/2014 1545 06/26/14 0406 06/28/14 0414 06/29/14 0348  WBC  --  25.2* 19.8* 16.4* 17.2*  HGB 16.3 14.2 12.2* 12.5* 12.3*  HCT 48.0 43.3 37.2* 37.9* 37.9*  MCV  --  94.5 96.1 95.7 95.7  PLT  --  292 271 327 333   Cardiac Enzymes:  Recent Labs Lab 06/29/2014 2241 06/26/14 0406 06/26/14 1220  TROPONINI <0.03 <0.03 <0.03   BNP (last 3 results)  Recent Labs  07/09/2014 1546  BNP 92.8    ProBNP (last 3 results) No results for input(s): PROBNP in the last 8760 hours.  CBG:  Recent Labs Lab 06/29/14 1241 06/29/14 1614 06/29/14 1805 06/29/14 2117 06/30/14 0743  GLUCAP 130* 136* 140* 121* 125*    Recent Results (from the past 240 hour(s))  Blood culture (routine x 2)     Status: None (Preliminary result)   Collection Time: 07/04/2014  3:28 PM  Result Value Ref Range Status   Specimen Description BLOOD RIGHT ARM  Final   Special Requests BOTTLES DRAWN AEROBIC AND ANAEROBIC 5CC EACH  Final   Culture   Final           BLOOD CULTURE RECEIVED NO GROWTH TO DATE CULTURE WILL BE HELD FOR 5 DAYS BEFORE ISSUING A FINAL NEGATIVE REPORT Performed at [facility]    Report Status PENDING  Incomplete  Blood culture (routine x 2)     Status: None (Preliminary result)   Collection Time: 06/20/2014  3:29 PM  Result Value Ref Range Status   Specimen Description BLOOD LEFT ARM  Final   Special Requests BOTTLES DRAWN AEROBIC AND ANAEROBIC 5CC EACH  Final   Culture   Final           BLOOD CULTURE RECEIVED NO GROWTH TO DATE CULTURE WILL BE HELD FOR 5 DAYS BEFORE ISSUING A FINAL NEGATIVE REPORT Performed at [facility]    Report Status PENDING  Incomplete  Urine culture     Status:  None   Collection Time: 07/08/2014  5:46 PM  Result Value Ref Range Status   Specimen Description Urine  Final   Special Requests Normal  Final   Colony Count NO GROWTH Performed at [facility]   Final   Culture NO GROWTH Performed at [facility]   Final   Report Status 06/26/2014 FINAL  Final  MRSA PCR Screening     Status: None   Collection Time: 06/19/2014 10:01 PM  Result Value Ref Range Status   MRSA by PCR NEGATIVE NEGATIVE Final    Comment:  The GeneXpert MRSA Assay (FDA approved for NASAL specimens only), is one component of a comprehensive MRSA colonization surveillance program. It is not intended to diagnose MRSA infection nor to guide or monitor treatment for MRSA infections.   Culture, Urine     Status: None   Collection Time: 06/26/14  1:45 AM  Result Value Ref Range Status   Specimen Description URINE, CATHETERIZED  Final   Special Requests NONE  Final   Colony Count NO GROWTH Performed at [facility]   Final   Culture NO GROWTH Performed at [facility]   Final   Report Status 06/27/2014 FINAL  Final  Culture, Urine     Status: None   Collection Time: 06/27/14 11:51 AM  Result Value Ref Range Status   Specimen Description URINE, CATHETERIZED  Final   Special Requests NONE  Final   Colony Count NO GROWTH Performed at [facility]   Final   Culture NO GROWTH Performed at [facility]   Final   Report Status 06/28/2014 FINAL  Final  Culture, blood (routine x 2)     Status: None (Preliminary result)   Collection Time: 06/27/14 12:47 PM  Result Value Ref Range Status   Specimen Description BLOOD LEFT ARM  Final   Special Requests BOTTLES DRAWN AEROBIC ONLY 10CC AEROBIC ONLY  Final   Culture   Final           BLOOD CULTURE RECEIVED NO GROWTH TO DATE CULTURE WILL BE HELD FOR 5 DAYS BEFORE ISSUING A FINAL NEGATIVE REPORT Performed at [facility]    Report Status PENDING  Incomplete   Culture, blood (routine x 2)     Status: None (Preliminary result)   Collection Time: 06/27/14 12:54 PM  Result Value Ref Range Status   Specimen Description BLOOD LEFT ARM  Final   Special Requests   Final    BOTTLES DRAWN AEROBIC AND ANAEROBIC 10CC BOTH BOTTLES   Culture   Final           BLOOD CULTURE RECEIVED NO GROWTH TO DATE CULTURE WILL BE HELD FOR 5 DAYS BEFORE ISSUING A FINAL NEGATIVE REPORT Performed at [facility]    Report Status PENDING  Incomplete  Clostridium Difficile by PCR (not at [facility])     Status: None   Collection Time: 06/27/14  6:00 PM  Result Value Ref Range Status   C difficile by pcr NEGATIVE NEGATIVE Final     Studies: No results found.  Scheduled Meds: . antiseptic oral rinse  7 mL Mouth Rinse q12n4p  . atenolol  100 mg Oral Daily  . atorvastatin  10 mg Oral QHS  . chlorhexidine  15 mL Mouth Rinse BID  . insulin aspart  0-20 Units Subcutaneous TID WC  . insulin aspart  0-5 Units Subcutaneous QHS  . nystatin  5 mL Oral QID  . nystatin   Topical TID  . rivaroxaban  20 mg Oral Daily  . sodium chloride  1,000 mL Intravenous Once  . sodium chloride  250 mL Intravenous Once   Continuous Infusions: . sodium chloride Stopped (06/29/14 0806)  . sodium chloride 75 mL/hr at 06/30/14 1200  . diltiazem (CARDIZEM) infusion Stopped (06/29/14 0930)      CHIU, STEPHEN K  [facility] Pager [phone]. If 7PM-7AM, please contact night-coverage at [email or web address], password [facility] 06/30/2014, 3:41 PM  LOS: 5 days

## 2014-06-30 NOTE — Progress Notes (Signed)
Patient ID: John Paul, male   DOB: 1940-03-21, 74 y.o.   MRN: [id number]  No changes at this point from cardiac perspective.  HR mildly elevated.  BP on the lower side but tolerated atenolol yesterday.  Would give him his home atenolol again today.  Would not plan to cardiovert atrial fibrillation in the setting of acute gastroenteritis, but if he remains in atrial fibrillation could consider cardioversion in a couple of weeks.  Will need followup with cardiology when discharged.   Call with further questions.  Marca Ancona 06/30/2014

## 2014-07-01 ENCOUNTER — Inpatient Hospital Stay (HOSPITAL_COMMUNITY): Payer: Medicare HMO

## 2014-07-01 DIAGNOSIS — J810 Acute pulmonary edema: Secondary | ICD-10-CM

## 2014-07-01 DIAGNOSIS — A084 Viral intestinal infection, unspecified: Secondary | ICD-10-CM

## 2014-07-01 DIAGNOSIS — E873 Alkalosis: Secondary | ICD-10-CM

## 2014-07-01 LAB — BLOOD GAS, ARTERIAL
Acid-base deficit: 0 mmol/L (ref 0.0–2.0)
Bicarbonate: 21.6 mEq/L (ref 20.0–24.0)
Drawn by: 103701
FIO2: 0.21 %
O2 Saturation: 96.2 %
PCO2 ART: 27.1 mmHg — AB (ref 35.0–45.0)
Patient temperature: 98.6
TCO2: 19.1 mmol/L (ref 0–100)
pH, Arterial: 7.513 — ABNORMAL HIGH (ref 7.350–7.450)
pO2, Arterial: 79.1 mmHg — ABNORMAL LOW (ref 80.0–100.0)

## 2014-07-01 LAB — CULTURE, BLOOD (ROUTINE X 2)
CULTURE: NO GROWTH
Culture: NO GROWTH

## 2014-07-01 LAB — CBC
HEMATOCRIT: 36.2 % — AB (ref 39.0–52.0)
Hemoglobin: 11.8 g/dL — ABNORMAL LOW (ref 13.0–17.0)
MCH: 30.7 pg (ref 26.0–34.0)
MCHC: 32.6 g/dL (ref 30.0–36.0)
MCV: 94.3 fL (ref 78.0–100.0)
Platelets: 327 10*3/uL (ref 150–400)
RBC: 3.84 MIL/uL — ABNORMAL LOW (ref 4.22–5.81)
RDW: 14 % (ref 11.5–15.5)
WBC: 14.7 10*3/uL — ABNORMAL HIGH (ref 4.0–10.5)

## 2014-07-01 LAB — MAGNESIUM
Magnesium: 1.5 mg/dL — ABNORMAL LOW (ref 1.7–2.4)
Magnesium: 1.4 mg/dL — ABNORMAL LOW (ref 1.7–2.4)

## 2014-07-01 LAB — BASIC METABOLIC PANEL
ANION GAP: 7 (ref 5–15)
BUN: 15 mg/dL (ref 6–20)
CALCIUM: 7.3 mg/dL — AB (ref 8.9–10.3)
CO2: 22 mmol/L (ref 22–32)
Chloride: 106 mmol/L (ref 101–111)
Creatinine, Ser: 0.88 mg/dL (ref 0.61–1.24)
GFR calc Af Amer: >60 mL/min (ref >60)
GLUCOSE: 143 mg/dL — AB (ref 65–99)
Potassium: 4.3 mmol/L (ref 3.5–5.1)
Sodium: 135 mmol/L (ref 135–145)

## 2014-07-01 LAB — LACTIC ACID, PLASMA
Lactic Acid, Venous: 1.6 mmol/L (ref 0.5–2.0)
Lactic Acid, Venous: 1.6 mmol/L (ref 0.5–2.0)

## 2014-07-01 LAB — GLUCOSE, CAPILLARY
GLUCOSE-CAPILLARY: 128 mg/dL — AB (ref 65–99)
Glucose-Capillary: 150 mg/dL — ABNORMAL HIGH (ref 65–99)
Glucose-Capillary: 146 mg/dL — ABNORMAL HIGH (ref 65–99)

## 2014-07-01 LAB — PROCALCITONIN: Procalcitonin: 0.37 ng/mL

## 2014-07-01 MED ORDER — FUROSEMIDE 10 MG/ML IJ SOLN
40.0000 mg | Freq: Two times a day (BID) | INTRAMUSCULAR | Status: DC
  Administered 2014-07-01: 40 mg via INTRAVENOUS

## 2014-07-01 MED ORDER — FUROSEMIDE 10 MG/ML IJ SOLN: 40.0000 mg | Freq: Two times a day (BID) | INTRAMUSCULAR | Status: DC

## 2014-07-01 MED ORDER — LEVALBUTEROL HCL 0.63 MG/3ML IN NEBU: 0.6300 mg | INHALATION_SOLUTION | RESPIRATORY_TRACT | Status: DC | PRN

## 2014-07-01 MED ORDER — PIPERACILLIN-TAZOBACTAM 3.375 G IVPB 30 MIN
3.3750 g | Freq: Once | INTRAVENOUS | Status: AC
  Administered 2014-07-01: 3.375 g via INTRAVENOUS
  Filled 2014-07-01: qty 50

## 2014-07-01 MED ORDER — MAGNESIUM SULFATE 2 GM/50ML IV SOLN
2.0000 g | Freq: Once | INTRAVENOUS | Status: AC
  Administered 2014-07-01: 2 g via INTRAVENOUS
  Filled 2014-07-01: qty 50

## 2014-07-01 MED ORDER — SODIUM CHLORIDE 0.9 % IV SOLN
1250.0000 mg | Freq: Two times a day (BID) | INTRAVENOUS | Status: DC
  Administered 2014-07-02 – 2014-07-03 (×3): 1250 mg via INTRAVENOUS
  Filled 2014-07-01 (×5): qty 1250

## 2014-07-01 MED ORDER — IPRATROPIUM-ALBUTEROL 0.5-2.5 (3) MG/3ML IN SOLN: 3.0000 mL | RESPIRATORY_TRACT | Status: DC | PRN

## 2014-07-01 MED ORDER — FUROSEMIDE 10 MG/ML IJ SOLN
INTRAMUSCULAR | Status: AC
  Filled 2014-07-01: qty 4

## 2014-07-01 MED ORDER — VANCOMYCIN HCL 10 G IV SOLR
2000.0000 mg | Freq: Once | INTRAVENOUS | Status: AC
  Administered 2014-07-01: 2000 mg via INTRAVENOUS
  Filled 2014-07-01: qty 2000

## 2014-07-01 MED ORDER — PIPERACILLIN-TAZOBACTAM 3.375 G IVPB
3.3750 g | Freq: Three times a day (TID) | INTRAVENOUS | Status: DC
  Administered 2014-07-02 – 2014-07-03 (×5): 3.375 g via INTRAVENOUS
  Filled 2014-07-01 (×5): qty 50

## 2014-07-01 MED ORDER — FUROSEMIDE 10 MG/ML IJ SOLN
40.0000 mg | Freq: Two times a day (BID) | INTRAMUSCULAR | Status: DC
  Administered 2014-07-02 – 2014-07-04 (×5): 40 mg via INTRAVENOUS
  Filled 2014-07-01 (×5): qty 4

## 2014-07-01 MED ORDER — IPRATROPIUM-ALBUTEROL 0.5-2.5 (3) MG/3ML IN SOLN
3.0000 mL | RESPIRATORY_TRACT | Status: DC
  Administered 2014-07-01: 3 mL via RESPIRATORY_TRACT
  Filled 2014-07-01: qty 3

## 2014-07-01 NOTE — Progress Notes (Signed)
eLink Physician-Brief Progress Note Patient Name: Vivian Purohit DOB: 1940/05/22 MRN: [id number]   Date of Service  07/01/2014  HPI/Events of Note  Patient with RR = 29 and wheezing. CXR with cardiomegaly and pulmonary congestion. BP =108/65.   eICU Interventions  Will order Lasix 40 mg IV now.      Intervention Category Intermediate Interventions: Respiratory distress - evaluation and management  Sommer,Steven Eugene 07/01/2014, 6:19 PM

## 2014-07-01 NOTE — Consult Note (Signed)
PULMONARY / CRITICAL CARE MEDICINE   Name: John Paul MRN: [id number] DOB: 05/22/1940    ADMISSION DATE:  06/20/2014 CONSULTATION DATE:  07/01/2014  REFERRING MD :  Elisabeth Pigeon  CHIEF COMPLAINT:  Diarrhea  INITIAL PRESENTATION:  74 y.o. M initially brought to [facility] ED 6/9 with diarrhea x 6 days.  Found to have hypotension with minimal improvement after 4L crystalloid.  Admitted by PCCM and transferred to [facility] following day.  Abx were d/c'd as all cultures remained negative.  Hospital course complicated by a.fib with RVR for which he was placed on cardizem which has since been stopped (now back on baseline atenolol only).  On 6/15, pt had mild fever to 100.7 with continued intermittent hypotension therefore primary team concerned for sepsis.  Workup initiated and pt placed back on Vanc / Zosyn.  Pt was also slightly tachypneic and apparently had mild respiratory distress; therefore, PCCM was re-consulted.   STUDIES:  CXR 6/15 >>> vascular congestion.  SIGNIFICANT EVENTS: 6/9 - admitted after 6 days diarrhea with decreased PO.   6/10 - TR[facility] assumed care. 6/11 - A.fib with RVR > started on cardizem gtt. 6/13 - back in RVR after cardizem stopped; therefore cardizem resumed for 24 hrs 6/15 - PCCM called for respiratory distress.  Placed back on vanc / zosyn and re-cultured for concerns of sepsis.   HISTORY OF PRESENT ILLNESS:  John Paul is a 74 y.o. M with PMH of DM, HTN, HLD, A.fib (on xarelto), OSA, morbid obesity.  He was initially brought to [facility] ED 6/9 due to diarrhea x 6 days.  Notes since then reviewed.  All cultures and stool studies have been negative. He developed RVR and was started on cardizem.  Is now back on regular outpatient dose of atenolol only. On evening of 6/15, he developed tachypnea and mild respiratory distress.  Per RN, he appeared comfortable and never felt symptomatic.  CXR revealed vascular congestion for which he was given  lasix with good improvement.  PCCM was  asked to see pt in consultation.  Had one episode of temp to 100.7 on 6/15.  Primary team concerned for sepsis so cultures re-drawn and placed back on vanc / zosyn.  PAST MEDICAL HISTORY :   has a past medical history of Diabetes mellitus; Hypertension; Hyperlipidemia; Morbid obesity; OSA (obstructive sleep apnea); and Atrial fibrillation.  has past surgical history that includes Vasectomy and US echocardiography (02/12/2009). Prior to Admission medications   Medication Sig Start Date End Date Taking? Authorizing Provider  Alcohol Swabs (B-D SINGLE USE SWABS REGULAR) PADS  06/19/14  Yes Historical Provider, MD  atenolol (TENORMIN) 100 MG tablet  01/02/14  Yes Historical Provider, MD  docusate sodium (COLACE) 100 MG capsule Take 100 mg by mouth 2 (two) times daily.   Yes Historical Provider, MD  metFORMIN (GLUCOPHAGE-XR) 500 MG 24 hr tablet Take 500 mg by mouth 2 (two) times daily. 01/15/14  Yes Historical Provider, MD  Multiple Vitamin (MULTIVITAMIN) tablet Take 1 tablet by mouth daily.     Yes Historical Provider, MD  OVER THE COUNTER MEDICATION 1 tablet daily as needed (Sudafed- strength unknown).   Yes Historical Provider, MD  quinapril (ACCUPRIL) 20 MG tablet  01/02/14  Yes Historical Provider, MD  rivaroxaban (XARELTO) 20 MG TABS tablet Take 1 tablet (20 mg total) by mouth daily. 01/13/14  Yes Peter M [place], MD  simvastatin (ZOCOR) 20 MG tablet Take 20 mg by mouth at bedtime.     Yes Historical Provider, MD  traMADol Janean Sark)  50 MG tablet  07/30/10  Yes Historical Provider, MD  triamterene-hydrochlorothiazide (DYAZIDE) 37.5-25 MG per capsule Take 1 capsule by mouth every morning.     Yes Historical Provider, MD  TRUE METRIX BLOOD GLUCOSE TEST test strip  06/19/14  Yes Historical Provider, MD  glyBURIDE-metformin (GLUCOVANCE) 2.5-500 MG per tablet Take 1 tablet by mouth 2 (two) times daily with a meal.      Historical Provider, MD   No Known Allergies  FAMILY HISTORY:  Family History   Problem Relation Age of Onset  . Clotting disorder Father     SOCIAL HISTORY:  reports that he quit smoking about 43 years ago. He does not have any smokeless tobacco history on file. He reports that he does not drink alcohol or use illicit drugs.  REVIEW OF SYSTEMS:   All negative; except for those that are bolded, which indicate positives.  Constitutional: weight loss, weight gain, night sweats, fevers, chills, fatigue, weakness.  HEENT: headaches, sore throat, sneezing, nasal congestion, post nasal drip, difficulty swallowing, tooth/dental problems, visual complaints, visual changes, ear aches. Neuro: difficulty with speech, weakness, numbness, ataxia. CV:  chest pain, orthopnea, PND, swelling in lower extremities, dizziness, palpitations, syncope.  Resp: cough, hemoptysis, dyspnea, wheezing. GI  heartburn, indigestion, abdominal pain, nausea, vomiting, diarrhea, constipation, change in bowel habits, loss of appetite, hematemesis, melena, hematochezia.  GU: dysuria, change in color of urine, urgency or frequency, flank pain, hematuria. MSK: joint pain or swelling, decreased range of motion. Psych: change in mood or affect, depression, anxiety, suicidal ideations, homicidal ideations. Skin: rash, itching, bruising.   SUBJECTIVE:  Denies SOB, chest pain.  States "I'm doing pretty good".  VITAL SIGNS: Temp:  [97.2 F (36.2 C)-100.7 F (38.2 C)] 99.7 F (37.6 C) (06/15 1724) Pulse Rate:  [84-112] 109 (06/15 1800) Resp:  [25-33] 29 (06/15 1800) BP: (88-123)/(45-69) 108/65 mmHg (06/15 1800) SpO2:  [91 %-97 %] 97 % (06/15 1800) Weight:  [161.481 kg (356 lb)] 161.481 kg (356 lb) (06/15 0435) HEMODYNAMICS:   VENTILATOR SETTINGS:   INTAKE / OUTPUT: Intake/Output      06/15 0701 - 06/16 0700   P.O. 125   I.V. (mL/kg) 375 (2.3)   IV Piggyback 50   Total Intake(mL/kg) 550 (3.4)   Urine (mL/kg/hr) 475 (0.2)   Stool 0 (0)   Total Output 475   Net +75       Stool Occurrence 1 x      PHYSICAL EXAMINATION: General: Morbidly obese adult male, in NAD. Neuro: A&O x 3, non-focal.  HEENT: [place]/AT. PERRL, sclerae anicteric. MM dry. Cardiovascular: IRIR, no M/R/G.  Lungs: Respirations even and unlabored.  CTA bilaterally. Abdomen: Morbidly obese.  BS not heard, abd soft, NT/ND.  Musculoskeletal: No gross deformities, no edema.  Skin: Intact, warm, no rashes.  LABS:  CBC  Recent Labs Lab 06/28/14 0414 06/29/14 0348 07/01/14 1000  WBC 16.4* 17.2* 14.7*  HGB 12.5* 12.3* 11.8*  HCT 37.9* 37.9* 36.2*  PLT 327 333 327   Coag's  Recent Labs Lab 06/22/2014 1546 06/26/14 0406  APTT 38* 43*  INR 3.70*  --    BMET  Recent Labs Lab 06/28/14 0414 06/29/14 0348 07/01/14 0335  NA 137 135 135  K 3.5 3.8 4.3  CL 107 107 106  CO2 21* 22 22  BUN CREATININE 0.95 1.04 0.88  GLUCOSE 139* 155* 143*   Electrolytes  Recent Labs Lab 06/26/14 0406  06/28/14 0414 06/29/14 0348 07/01/14 0335 07/01/14 1000  CALCIUM  7.9*  < > 7.4* 7.3* 7.3*  --   MG 1.6*  < > 1.4* 1.5* 1.5* 1.4*  PHOS 2.9  --   --   --   --   --   < > = values in this interval not displayed. Sepsis Markers  Recent Labs Lab 14-Jul-2014 1849  06/27/14 0403 06/29/14 0348 07/01/14 1737 07/01/14 1805 07/01/14 1909  LATICACIDVEN 1.70  --   --   --   --  1.6 1.6  PROCALCITON  --   < > 0.73 0.72 0.37  --   --   < > = values in this interval not displayed. ABG  Recent Labs Lab 06/26/14 2115 07/01/14 1815  PHART 7.444 7.513*  PCO2ART 25.5* 27.1*  PO2ART 85.1 79.1*   Liver Enzymes  Recent Labs Lab July 14, 2014 1653 06/27/14 0403  AST 18 16  ALT 15* 14*  ALKPHOS 71 66  BILITOT 0.8 0.4  ALBUMIN 2.8* 2.3*   Cardiac Enzymes  Recent Labs Lab July 14, 2014 2241 06/26/14 0406 06/26/14 1220  TROPONINI <0.03 <0.03 <0.03   Glucose  Recent Labs Lab 06/29/14 2117 06/30/14 0743 06/30/14 2053 07/01/14 0730 07/01/14 1218 07/01/14 1622  GLUCAP 121* 125* 164* 150* 146* 128*     Imaging Dg Chest Port 1 View  07/01/2014   CLINICAL DATA:  Shortness of breath.  Weakness.  EXAM: PORTABLE CHEST - 1 VIEW  COMPARISON:  06/27/2014 and 07/14/14 and 04/14/2005  FINDINGS: There is new pulmonary vascular congestion without pulmonary edema. Chronic cardiomegaly. Chronic tortuosity and calcification of the thoracic aorta. No acute osseous abnormality.  IMPRESSION: Pulmonary vascular congestion.  Chronic cardiomegaly.   Electronically Signed   By: Francene Boyers M.D.   On: 07/01/2014 18:03   ASSESSMENT / PLAN:  Pulmonary edema - s/p 40mg  lasix x 1 with good improvement. Respiratory alkalosis. OSA with probable OHS - not on CPAP. Certainly a component of restrictive lung disease given body habitus. Plan: Will try BiPAP as he will tolerate, likely need PPV every night at baseline Pulmonary hygiene. Mobilize as able, he appears to be profoundly debilitated  Concern for sepsis - not overly impressed though will consider given very mild fever 6/15 along with persistent mild leukocytosis and intermittent hypotension (? Accuracy of cuff given body habitus and cuff location on forearm).  qSOFA score 2. Plan: Re-cultured 6/15. PCT is down to 0.37. I would recommend d/c abx and follow clinical course.   Rest per primary.  Family updated: Grandson at bedside.   Rutherford Guys, [place] [facility] Pager: [phone]  or [phone] 07/01/2014, 8:46 PM  Attending Note:  I have examined patient, reviewed labs, studies and notes. I have discussed the case with Ihor Dow, and I agree with the data and plans as amended above.   Levy Pupa, MD, PhD 07/02/2014, 12:03 PM [facility] [phone] or if no answer [phone]

## 2014-07-01 NOTE — Progress Notes (Signed)
Pt placed on nocturnal BiPAP.  Machine plugged into red outlet, 2 lpm of O2 bled into circuit, and humidifier filled with sterile water.  Pt resting stable and comfortable on settings noted.

## 2014-07-01 NOTE — Evaluation (Signed)
Physical Therapy Evaluation Patient Details Name: John Paul MRN: [id number] DOB: 09-Sep-1940 Today's Date: 07/01/2014   History of Present Illness  pt was admitted for dehydration:  had nausea, diarrhea, and hpotension.  PMH significant for DM, morbid obesity and A Fib  Clinical Impression  This 74 year old man was admitted as above and presents with functional mobility limitations 2* generalized weakness, generalized pain and morbid obesity.  Dependent on acute stay progress and limitations of pain, pt will likely require follow up rehab at SNF level to maximize IND and safety prior to return home.    Follow Up Recommendations SNF    Equipment Recommendations  None recommended by PT    Recommendations for Other Services OT consult     Precautions / Restrictions Precautions Precautions: Fall Precaution Comments: pt has pain in bil LEs and LUE Restrictions Weight Bearing Restrictions: No      Mobility  Bed Mobility               General bed mobility comments: not attempted due to pain; based on strength, will need total A x 2  Transfers                 General transfer comment: unable to attempt 2* pain level and pt size  Ambulation/Gait             General Gait Details: unable to attempt 2* pain level  Stairs            Wheelchair Mobility    Modified Rankin (Stroke Patients Only)       Balance Overall balance assessment:  (not assessed)                                           Pertinent Vitals/Pain Pain Assessment: Faces Pain Score: 10-Worst pain ever Faces Pain Scale: Hurts worst Pain Location: L UE, bil LEs painful but less than L UE Pain Descriptors / Indicators: Moaning;Grimacing Pain Intervention(s): Limited activity within patient's tolerance;Monitored during session;Premedicated before session    Home Living Family/patient expects to be discharged to:: Unsure Living Arrangements: Spouse/significant  other;Children;Other relatives               Additional Comments: wife assisted with diabetic compression socks and fasten shoes.      Prior Function Level of Independence: Independent with assistive device(s)               Hand Dominance        Extremity/Trunk Assessment   Upper Extremity Assessment: Defer to OT evaluation RUE Deficits / Details: WFLs, grossly 4-/5, triceps 3+/5     LUE Deficits / Details: LUE hurts from elbow to fingers--since today. Pt unable to tolerate AAROm   Lower Extremity Assessment: Generalized weakness;RLE deficits/detail;LLE deficits/detail RLE Deficits / Details: Ltd movement all jts and all planes.  Pt tolerated knee flex to 40 and min hip abd LLE Deficits / Details: Pt pain ltd all jts all planes with pt tolerating knee flex to ~40 and min hip abd     Communication   Communication: No difficulties  Cognition Arousal/Alertness: Awake/alert Behavior During Therapy: Flat affect Overall Cognitive Status: Within Functional Limits for tasks assessed                      General Comments      Exercises  Assessment/Plan    PT Assessment Patient needs continued PT services  PT Diagnosis Difficulty walking;Generalized weakness;Acute pain   PT Problem List Decreased strength;Decreased range of motion;Decreased activity tolerance;Decreased mobility;Decreased balance;Decreased knowledge of use of DME;Obesity;Pain  PT Treatment Interventions DME instruction;Gait training;Stair training;Therapeutic activities;Functional mobility training;Therapeutic exercise;Patient/family education   PT Goals (Current goals can be found in the Care Plan section) Acute Rehab PT Goals Patient Stated Goal: have a vision for the end of this PT Goal Formulation: With patient Time For Goal Achievement: 07/15/14 Potential to Achieve Goals: Fair    Frequency Min 3X/week   Barriers to discharge        Co-evaluation PT/OT/SLP  Co-Evaluation/Treatment: Yes Reason for Co-Treatment: For patient/therapist safety PT goals addressed during session: Mobility/safety with mobility;Strengthening/ROM OT goals addressed during session: Strengthening/ROM       End of Session   Activity Tolerance: Patient limited by pain;Patient limited by fatigue Patient left: in bed;with call bell/phone within reach;with family/visitor present Nurse Communication: Mobility status         Time: 8127-5170 PT Time Calculation (min) (ACUTE ONLY): 18 min   Charges:   PT Evaluation $Initial PT Evaluation Tier I: 1 Procedure     PT G Codes:        John Paul July 14, 2014, 5:25 PM

## 2014-07-01 NOTE — Progress Notes (Addendum)
Notified by RN pt wheezing with more resp distress. Concern that he may have fluid overload due to IV fluids given for hypotension. Could not give lasix since BP 88/51. Placed order for duoneb, x ray and abg stat. Since he just spiked fever of 100.2F my concern is that he is septic so sepsis work up initiated. Placed back on vanco and zosyn Called pulm on call. Appreciate their assistance   Manson Passey [facility] [phone]

## 2014-07-01 NOTE — Progress Notes (Signed)
ANTIBIOTIC CONSULT NOTE - INITIAL  Pharmacy Consult for vancomycin, Zosyn Indication: rule out sepsis  No Known Allergies  Patient Measurements: Height: 5\' 10"  (177.8 cm) Weight: (!) 356 lb (161.481 kg) IBW/kg (Calculated) : 73  Vital Signs: Temp: 99.7 F (37.6 C) (06/15 1724) Temp Source: Oral (06/15 1724) BP: 88/51 mmHg (06/15 1705) Pulse Rate: 84 (06/15 1705) Intake/Output from previous day: 06/14 0701 - 06/15 0700 In: 1700 [I.V.:1700] Out: 400 [Urine:400] Intake/Output from this shift: Total I/O In: 550 [P.O.:125; I.V.:375; IV Piggyback:50] Out: 475 [Urine:475]  Labs:  Recent Labs  06/29/14 0348 07/01/14 0335 07/01/14 1000  WBC 17.2*  --  14.7*  HGB 12.3*  --  11.8*  PLT 333  --  327  CREATININE 1.04 0.88  --    Estimated Creatinine Clearance: 118 mL/min (by C-G formula based on Cr of 0.88). No results for input(s): VANCOTROUGH, VANCOPEAK, VANCORANDOM, GENTTROUGH, GENTPEAK, GENTRANDOM, TOBRATROUGH, TOBRAPEAK, TOBRARND, AMIKACINPEAK, AMIKACINTROU, AMIKACIN in the last 72 hours.   Microbiology: Recent Results (from the past 720 hour(s))  Blood culture (routine x 2)     Status: None   Collection Time: 07/13/2014  3:28 PM  Result Value Ref Range Status   Specimen Description BLOOD RIGHT ARM  Final   Special Requests BOTTLES DRAWN AEROBIC AND ANAEROBIC 5CC EACH  Final   Culture   Final    NO GROWTH 5 DAYS Performed at [facility]    Report Status 07/01/2014 FINAL  Final  Blood culture (routine x 2)     Status: None   Collection Time: 07/09/2014  3:29 PM  Result Value Ref Range Status   Specimen Description BLOOD LEFT ARM  Final   Special Requests BOTTLES DRAWN AEROBIC AND ANAEROBIC 5CC EACH  Final   Culture   Final    NO GROWTH 5 DAYS Performed at [facility]    Report Status 07/01/2014 FINAL  Final  Urine culture     Status: None   Collection Time: 06/26/2014  5:46 PM  Result Value Ref Range Status   Specimen Description Urine  Final   Special Requests Normal  Final   Colony Count NO GROWTH Performed at [facility]   Final   Culture NO GROWTH Performed at [facility]   Final   Report Status 06/26/2014 FINAL  Final  MRSA PCR Screening     Status: None   Collection Time: 07/10/2014 10:01 PM  Result Value Ref Range Status   MRSA by PCR NEGATIVE NEGATIVE Final    Comment:        The GeneXpert MRSA Assay (FDA approved for NASAL specimens only), is one component of a comprehensive MRSA colonization surveillance program. It is not intended to diagnose MRSA infection nor to guide or monitor treatment for MRSA infections.   Culture, Urine     Status: None   Collection Time: 06/26/14  1:45 AM  Result Value Ref Range Status   Specimen Description URINE, CATHETERIZED  Final   Special Requests NONE  Final   Colony Count NO GROWTH Performed at [facility]   Final   Culture NO GROWTH Performed at [facility]   Final   Report Status 06/27/2014 FINAL  Final  Culture, Urine     Status: None   Collection Time: 06/27/14 11:51 AM  Result Value Ref Range Status   Specimen Description URINE, CATHETERIZED  Final   Special Requests NONE  Final   Colony Count NO GROWTH Performed at [facility]   Final   Culture NO GROWTH Performed at [facility]   Final   Report Status 06/28/2014 FINAL  Final  Culture, blood (routine x 2)     Status: None (Preliminary result)   Collection Time: 06/27/14 12:47 PM  Result Value Ref Range Status   Specimen Description BLOOD LEFT ARM  Final   Special Requests BOTTLES DRAWN AEROBIC ONLY 10CC AEROBIC ONLY  Final   Culture   Final           BLOOD CULTURE RECEIVED NO GROWTH TO DATE CULTURE WILL BE HELD FOR 5 DAYS BEFORE ISSUING A FINAL NEGATIVE REPORT Performed at [facility]    Report Status PENDING  Incomplete  Culture, blood (routine x 2)     Status: None (Preliminary result)   Collection Time: 06/27/14 12:54 PM  Result Value  Ref Range Status   Specimen Description BLOOD LEFT ARM  Final   Special Requests   Final    BOTTLES DRAWN AEROBIC AND ANAEROBIC 10CC BOTH BOTTLES   Culture   Final           BLOOD CULTURE RECEIVED NO GROWTH TO DATE CULTURE WILL BE HELD FOR 5 DAYS BEFORE ISSUING A FINAL NEGATIVE REPORT Performed at [facility]    Report Status PENDING  Incomplete  Clostridium Difficile by PCR (not at [facility])     Status: None   Collection Time: 06/27/14  6:00 PM  Result Value Ref Range Status   C difficile by pcr NEGATIVE NEGATIVE Final   Medical History: Past Medical History  Diagnosis Date  . Diabetes mellitus   . Hypertension   . Hyperlipidemia   . Morbid obesity     WITH HYPOVENTILATION  . OSA (obstructive sleep apnea)   . Atrial fibrillation    Medications:  Scheduled:  . antiseptic oral rinse  7 mL Mouth Rinse q12n4p  . atenolol  100 mg Oral Daily  . atorvastatin  10 mg Oral QHS  . chlorhexidine  15 mL Mouth Rinse BID  . insulin aspart  0-20 Units Subcutaneous TID WC  . insulin aspart  0-5 Units Subcutaneous QHS  . ipratropium-albuterol  3 mL Nebulization Q4H  . nystatin  5 mL Oral QID  . nystatin   Topical TID  . piperacillin-tazobactam  3.375 g Intravenous Once  . rivaroxaban  20 mg Oral Daily  . vancomycin  1,000 mg Intravenous Once   Infusions:  . sodium chloride Stopped (06/29/14 0806)  . sodium chloride 75 mL/hr at 06/30/14 1200  . diltiazem (CARDIZEM) infusion Stopped (06/29/14 0930)   Assessment: 74 yo from home presents to ER with SOB, diarrhea, and cough. PMH includes morbidly obese, DM, HLP, OSA and hx Afib on Xarelto. Started Vancomycin and Zosyn 6/9 for rule out sepsis and Flagyl for rule out diarrhea.   6/9 >> vanc >> 6/10 6/9 >> Zosyn >> 6/10 6/9 >> Flagyl >> 6/13 (CDiff was negative)  Febrile again, WBC improved but remains elevated, SCr wnl Resume Vancomycin and Zosyn for rule out sepsis, repeat blood cx ordered  Goal of Therapy:  Vancomycin trough  level 15-20 mcg/ml  Plan:    Vancomycin 2g IV x1 then  IV q12    Zosyn 3.375g IV q8 (extended interval infusion)   Otho Bellows PharmD Pager [phone] 07/01/2014, 5:52 PM

## 2014-07-01 NOTE — Progress Notes (Signed)
Patient ID: John Paul, male   DOB: Jan 21, 1940, 74 y.o.   MRN: [id number] [facility] PROGRESS NOTE  Jearld Hemp FHQ:[id number] DOB: 10-Sep-1940 DOA: 06/24/2014 PCP: Chesley Noon, MD  Brief narrative:    15 -year-old male with past medical history of hypertension, atrial fibrillation on anticoagulation with xarelto, diabetes, dyslipidemia who presented to ED with ongoing nausea, diarrhea and resultant hypotension. Patient was initially under critical care medicine service until his blood pressure was stabilized with aggressive fluid resuscitation. [facility] assumed care from 06/26/2014. Patient was initially on vancomycin, Zosyn, Flagyl. All antibodies are discontinued at this point since blood cultures, urine culture, C. difficile and GI pathogen panel are negative.   Patient hospital course has been complicated with atrial fibrillation with rapid ventricular rate requiring Cardizem drip. Patient was seen by cardiology in consultation. His Cardizem drip has been discontinued since 06/30/2014. Heart rate is controlled with atenolol. Patient remains in step down unit because of hypotension.   Assessment/Plan:     Principal problem: Diarrhea / dehydration / hypotension - Unclear etiology of diarrhea. GI pathogen panel and stool for C. difficile are all negative. - Patient has received IV fluids from the time of the admission and his blood pressure although better it is still fluctuating from 88/55 to 105/58 - Please note that patient was on vancomycin, Zosyn and Flagyl from the time of the admission and all those antibodies are now discontinued because no clear source of infection to account for diarrhea or hypotension. - Diet as tolerated. - Continue to monitor in step down unit for now.  Active problems: Sepsis / leukocytosis / acute viral gastroenteritis - Sepsi criteria met with tachycardia, tachypnea, leukocytosis in addition to fever on admission. Pt also had elevated lactic acid  and procalcitonin level. No clear source of infection although possibility includes acute viral gastroenteritis considering patient presented with nausea and diarrhea. - Patient was on vancomycin, Zosyn, Flagyl. All those antibodies discontinued since blood cultures, urine culture, C. difficile and GI pathogen panel are all negative. - Probably tachycardia secondary to uncontrolled atrial fibrillation and hypotension likely secondary to previously given Cardizem drip and now atenolol.  Atrial fibrillation with RVR - Patient developed rapid ventricular rate on June 11 and has required Cardizem drip for 24 hour period. Then he was off of Cardizem drip and on home dose of atenolol and went back into atrial fibrillation on 06/28/2016 and was back on Cardizem drip through 06/30/2014. - Per cardiology is okay to resume atenolol home dose. No current plan for cardioversion because of acute gastroenteritis. - Blood pressure is 105/58 and heart rate 108. - CHADS vasc score at least 3 - Continue anticoagulation with xarelto.  Non-anion gap metabolic acidosis - Likely secondary to profound diarrhea - Resolved   Hyponatremia - Secondary to GI losses. - Sodium resolved to normal with IV fluids.  Hypomagnesemia - Unclear etiology. Supplemented. - Follow-up BMP and magnesium tomorrow morning.  Diabetes mellitus, controlled - A1c on this admission is 6.0 indicating good glycemic control. - Continue sliding scale insulin for now.   Dyslipidemia - Continue statin therapy  Morbid obesity with comorbid conditions including diabetes, hypertension, dyslipidemia - Body mass index is 51.08 kg/(m^2). - Advised on diet.  OSA, not on CPAP - Stable.   DVT Prophylaxis  - SCDs bilaterally   Code Status: Full.  Family Communication:  plan of care discussed with the patient and his wife at the bedside. Disposition Plan: Remains in step down unit because of hypotension.  IV access:  Peripheral  IV  Procedures and diagnostic studies:    No results found.  Medical Consultants:  Cardiology   Other Consultants:  Physical therapy  IAnti-Infectives:   Vancomycin 6/9>>>6/10 Zosyn 6/9>>>6/10 Flagyl 6/9>>>6/13   Leisa Lenz, MD  [facility] Pager [phone]  Time spent in minutes: 25 minutes  If 7PM-7AM, please contact night-coverage [email or web address] Password [email or web address] 07/01/2014, 11:18 AM   LOS: 6 days    HPI/Subjective:  No acute overnight events. Patient reports no nausea or vomiting. He still has diarrhea  although less frequent.    Objective: Filed Vitals:   07/01/14 0400 07/01/14 0435 07/01/14 0600 07/01/14 0745  BP: 113/69  105/58   Pulse: 103  108   Temp: 97.2 F (36.2 C)   98.1 F (36.7 C)  TempSrc: Oral   Oral  Resp:      Height:      Weight:  161.481 kg (356 lb)    SpO2: 94%  93%     Intake/Output Summary (Last 24 hours) at 07/01/14 1118 Last data filed at 07/01/14 0926  Gross per 24 hour  Intake   1500 ml  Output    375 ml  Net   1125 ml    Exam:   General:  Pt is alert, follows commands appropriately, not in acute distress, obese  Cardiovascular: Regular rate and rhythm, S1/S2 (+)  Respiratory: Clear to auscultation bilaterally, no wheezing, no crackles, no rhonchi  Abdomen: Soft, non tender, non distended, bowel sounds present  Extremities: No edema, pulses DP and PT palpable bilaterally  Neuro: Grossly nonfocal  Data Reviewed: Basic Metabolic Panel:  Recent Labs Lab 06/26/14 0406 06/27/14 0403 06/28/14 0414 06/29/14 0348 07/01/14 0335 07/01/14 1000  NA 137 140 137 135 135  --   K 4.5 3.9 3.5 3.8 4.3  --   CL 110 110 107 107 106  --   CO2 19* 21* 21* 22 22  --   GLUCOSE 133* 141* 139* 155* 143*  --   BUN 37* 23* _0 --   CREATININE 1.57* 1.25* 0.95 1.04 0.88  --   CALCIUM 7.9* 7.7* 7.4* 7.3* 7.3*  --   MG 1.6* 1.3* 1.4* 1.5* 1.5* 1.4*  PHOS 2.9  --   --   --   --   --    Liver Function Tests:  Recent  Labs Lab 06/24/2014 1653 06/27/14 0403  AST 18 16  ALT 15* 14*  ALKPHOS 71 66  BILITOT 0.8 0.4  PROT 6.1* 5.3*  ALBUMIN 2.8* 2.3*   No results for input(s): LIPASE, AMYLASE in the last 168 hours.  Recent Labs Lab 07/11/2014 1653  AMMONIA 11   CBC:  Recent Labs Lab 06/18/2014 1545 06/26/14 0406 06/28/14 0414 06/29/14 0348 07/01/14 1000  WBC 25.2* 19.8* 16.4* 17.2* 14.7*  HGB 14.2 12.2* 12.5* 12.3* 11.8*  HCT 43.3 37.2* 37.9* 37.9* 36.2*  MCV 94.5 96.1 95.7 95.7 94.3  PLT 292 271 327 333 327   Cardiac Enzymes:  Recent Labs Lab 06/22/2014 2241 06/26/14 0406 06/26/14 1220  TROPONINI <0.03 <0.03 <0.03   BNP: Invalid input(s): POCBNP CBG:  Recent Labs Lab 06/29/14 1805 06/29/14 2117 06/30/14 0743 06/30/14 2053 07/01/14 0730  GLUCAP 140* 121* 125* 164* 150*    Recent Results (from the past 240 hour(s))  Blood culture (routine x 2)     Status: None   Collection Time: 06/18/2014  3:28 PM  Result Value Ref Range Status   Specimen Description BLOOD RIGHT ARM  Final   Special Requests BOTTLES DRAWN AEROBIC AND ANAEROBIC 5CC EACH  Final   Culture   Final    NO GROWTH 5 DAYS Performed at [facility]    Report Status 07/01/2014 FINAL  Final  Blood culture (routine x 2)     Status: None   Collection Time: 07/09/2014  3:29 PM  Result Value Ref Range Status   Specimen Description BLOOD LEFT ARM  Final   Special Requests BOTTLES DRAWN AEROBIC AND ANAEROBIC 5CC EACH  Final   Culture   Final    NO GROWTH 5 DAYS Performed at [facility]    Report Status 07/01/2014 FINAL  Final  Urine culture     Status: None   Collection Time: 07/05/2014  5:46 PM  Result Value Ref Range Status   Specimen Description Urine  Final   Special Requests Normal  Final   Colony Count NO GROWTH Performed at [facility]   Final   Culture NO GROWTH Performed at [facility]   Final   Report Status 06/26/2014 FINAL  Final  MRSA PCR Screening     Status: None    Collection Time: 07/09/2014 10:01 PM  Result Value Ref Range Status   MRSA by PCR NEGATIVE NEGATIVE Final  Culture, Urine     Status: None   Collection Time: 06/26/14  1:45 AM  Result Value Ref Range Status   Specimen Description URINE, CATHETERIZED  Final   Special Requests NONE  Final   Colony Count NO GROWTH Performed at [facility]   Final   Culture NO GROWTH Performed at [facility]   Final   Report Status 06/27/2014 FINAL  Final  Culture, Urine     Status: None   Collection Time: 06/27/14 11:51 AM  Result Value Ref Range Status   Specimen Description URINE, CATHETERIZED  Final   Special Requests NONE  Final   Colony Count NO GROWTH Performed at [facility]   Final   Culture NO GROWTH Performed at [facility]   Final   Report Status 06/28/2014 FINAL  Final  Culture, blood (routine x 2)     Status: None (Preliminary result)   Collection Time: 06/27/14 12:47 PM  Result Value Ref Range Status   Specimen Description BLOOD LEFT ARM  Final   Special Requests BOTTLES DRAWN AEROBIC ONLY 10CC AEROBIC ONLY  Final   Culture   Final           BLOOD CULTURE RECEIVED NO GROWTH TO DATE CULTURE WILL BE HELD FOR 5 DAYS BEFORE ISSUING A FINAL NEGATIVE REPORT Performed at [facility]    Report Status PENDING  Incomplete  Culture, blood (routine x 2)     Status: None (Preliminary result)   Collection Time: 06/27/14 12:54 PM  Result Value Ref Range Status   Specimen Description BLOOD LEFT ARM  Final   Special Requests   Final    BOTTLES DRAWN AEROBIC AND ANAEROBIC 10CC BOTH BOTTLES   Culture   Final           BLOOD CULTURE RECEIVED NO GROWTH TO DATE CULTURE WILL BE HELD FOR 5 DAYS BEFORE ISSUING A FINAL NEGATIVE REPORT Performed at [facility]    Report Status PENDING  Incomplete  Clostridium Difficile by PCR (not at [facility])     Status: None   Collection Time: 06/27/14  6:00 PM  Result Value Ref Range Status   C difficile by pcr  NEGATIVE NEGATIVE Final     Scheduled Meds: . antiseptic oral rinse  7 mL Mouth Rinse q12n4p  . atenolol  100 mg Oral Daily  . atorvastatin  10 mg Oral QHS  . chlorhexidine  15 mL Mouth Rinse BID  . insulin aspart  0-20 Units Subcutaneous TID WC  . insulin aspart  0-5 Units Subcutaneous QHS  . magnesium sulfate 1 - 4 g bolus IVPB  2 g Intravenous Once  . nystatin  5 mL Oral QID  . nystatin   Topical TID  . rivaroxaban  20 mg Oral Daily   Continuous Infusions: . sodium chloride Stopped (06/29/14 0806)  . sodium chloride 75 mL/hr at 06/30/14 1200  . diltiazem (CARDIZEM) infusion Stopped (06/29/14 0930)

## 2014-07-01 NOTE — Evaluation (Signed)
Occupational Therapy Evaluation Patient Details Name: John Paul MRN: [id number] DOB: 06-17-1940 Today's Date: 07/01/2014    History of Present Illness pt was admitted for dehydration:  had nausea, diarrhea, and hpotension.  PMH significant for DM, morbid obesity and A Fib   Clinical Impression   This 74 year old man was admitted for the above.  He will benefit from skilled OT to increase participation in ADLs and generalized strengthening.  Pt needed min A for LB adls prior to admission and ambulated with 4 wheel walker. Pt now needs total A x 2-3 for most adls and has pain which limits him greatly.  Will follow in acute with focus on UE ADLs, rolling for ADLs and arm strengthing. Will upgrade goals during admission as appropriate    Follow Up Recommendations  SNF    Equipment Recommendations   (to be further assessed, 3:1 if pt can use safely)    Recommendations for Other Services       Precautions / Restrictions Precautions Precautions: Fall Precaution Comments: pt has pain in bil LEs and LUE Restrictions Weight Bearing Restrictions: No      Mobility Bed Mobility               General bed mobility comments: not attempted due to pain; based on strength, will need total A x 2  Transfers                 General transfer comment: unable    Balance Overall balance assessment:  (not assessed)                                          ADL Overall ADL's : Needs assistance/impaired Eating/Feeding: Minimal assistance;Bed level   Grooming: Minimal assistance;Sitting                                 General ADL Comments: pt needs total A for UB adls.  Pt did not eat when OT when present, but drank, second time OT was there, he needed assistance to lift cup higher to bring straw to mouth.  Pt's pain limits him--he had very high pain when nursing rolled him earlier:  A x 3 for LB adls.  Pt was agreeable to working on LUE tricep  strengthening:  did not participate in extending arm so that I could adjust the band on the bed.  Will address on next visit.     Vision     Perception     Praxis      Pertinent Vitals/Pain Pain Assessment: Faces Pain Score: 10-Worst pain ever Faces Pain Scale: Hurts worst Pain Location: LUE, bil LEs painful but less Pain Intervention(s): Limited activity within patient's tolerance;Monitored during session;Premedicated before session     Hand Dominance     Extremity/Trunk Assessment Upper Extremity Assessment Upper Extremity Assessment: Overall WFL for tasks assessed;LUE deficits/detail;RUE deficits/detail RUE Deficits / Details: WFLs, grossly 4-/5, triceps 3+/5 LUE Deficits / Details: LUE hurts from elbow to fingers--since today.  Able to lift slightly so that I could get cord out from under him.  Unable to tolerate AAROM           Communication Communication Communication: No difficulties   Cognition Arousal/Alertness: Awake/alert Behavior During Therapy: Flat affect Overall Cognitive Status: No family/caregiver present to determine baseline cognitive functioning (did not  ask in front of pt: decreased initiation, memory)                     General Comments       Exercises       Shoulder Instructions      Home Living Family/patient expects to be discharged to:: Unsure Living Arrangements: Spouse/significant other;Children;Other relatives                               Additional Comments: wife assisted with diabetic compression socks and fasten shoes.        Prior Functioning/Environment Level of Independence: Independent with assistive device(s)             OT Diagnosis: Acute pain;Generalized weakness   OT Problem List: Decreased strength;Decreased activity tolerance;Decreased knowledge of use of DME or AE;Pain;Impaired UE functional use;Cardiopulmonary status limiting activity;Decreased cognition   OT Treatment/Interventions:  Self-care/ADL training;DME and/or AE instruction;Patient/family education;Therapeutic activities;Cognitive remediation/compensation;Therapeutic exercise    OT Goals(Current goals can be found in the care plan section) Acute Rehab OT Goals Patient Stated Goal: have a vision for the end of this OT Goal Formulation: With patient/family Time For Goal Achievement: 07/15/14 Potential to Achieve Goals: Fair ADL Goals Pt Will Perform Grooming: with set-up;bed level (2 tasks) Pt Will Perform Upper Body Bathing: with min assist;bed level Additional ADL Goal #1: pt will roll to bil sides with max A x 2 for adls Additional ADL Goal #2: pt will perform 2 sets of 5 reps, LUE triceps strengthening with level 2 theraband at bed level  OT Frequency: Min 2X/week   Barriers to D/C:            Co-evaluation PT/OT/SLP Co-Evaluation/Treatment: Yes Reason for Co-Treatment: For patient/therapist safety PT goals addressed during session: Mobility/safety with mobility;Strengthening/ROM OT goals addressed during session: Strengthening/ROM      End of Session    Activity Tolerance: No increased pain Patient left: in bed;with call bell/phone within reach;with family/visitor present   Time: 1358-1412 abd 1506 - 1513 21 minutes Charges:  OT General Charges $OT Visit: 1 Procedure OT Evaluation $Initial OT Evaluation Tier I: 1 Procedure G-Codes:    Dominick Zertuche 07-14-14, 3:31 PM  Marica Otter, OTR/L [phone] 2014-07-14

## 2014-07-02 ENCOUNTER — Inpatient Hospital Stay (HOSPITAL_COMMUNITY): Payer: Medicare HMO

## 2014-07-02 DIAGNOSIS — R0602 Shortness of breath: Secondary | ICD-10-CM

## 2014-07-02 DIAGNOSIS — I4891 Unspecified atrial fibrillation: Secondary | ICD-10-CM

## 2014-07-02 DIAGNOSIS — J810 Acute pulmonary edema: Secondary | ICD-10-CM

## 2014-07-02 LAB — COMPREHENSIVE METABOLIC PANEL
ALBUMIN: 1.9 g/dL — AB (ref 3.5–5.0)
ALT: 11 U/L — ABNORMAL LOW (ref 17–63)
ANION GAP: 11 (ref 5–15)
AST: 17 U/L (ref 15–41)
Alkaline Phosphatase: 70 U/L (ref 38–126)
BILIRUBIN TOTAL: 0.4 mg/dL (ref 0.3–1.2)
BUN: 17 mg/dL (ref 6–20)
CHLORIDE: 103 mmol/L (ref 101–111)
CO2: 24 mmol/L (ref 22–32)
CREATININE: 0.93 mg/dL (ref 0.61–1.24)
Calcium: 7.4 mg/dL — ABNORMAL LOW (ref 8.9–10.3)
GFR calc Af Amer: >60 mL/min (ref >60)
Glucose, Bld: 142 mg/dL — ABNORMAL HIGH (ref 65–99)
POTASSIUM: 3.9 mmol/L (ref 3.5–5.1)
Sodium: 138 mmol/L (ref 135–145)
Total Protein: 5.2 g/dL — ABNORMAL LOW (ref 6.5–8.1)

## 2014-07-02 LAB — GLUCOSE, CAPILLARY
GLUCOSE-CAPILLARY: 125 mg/dL — AB (ref 65–99)
GLUCOSE-CAPILLARY: 102 mg/dL — AB (ref 65–99)
GLUCOSE-CAPILLARY: 142 mg/dL — AB (ref 65–99)
Glucose-Capillary: 131 mg/dL — ABNORMAL HIGH (ref 65–99)
Glucose-Capillary: 138 mg/dL — ABNORMAL HIGH (ref 65–99)
Glucose-Capillary: 133 mg/dL — ABNORMAL HIGH (ref 65–99)

## 2014-07-02 LAB — CBC
HCT: 38.7 % — ABNORMAL LOW (ref 39.0–52.0)
Hemoglobin: 12.7 g/dL — ABNORMAL LOW (ref 13.0–17.0)
MCH: 31.1 pg (ref 26.0–34.0)
MCHC: 32.8 g/dL (ref 30.0–36.0)
MCV: 94.9 fL (ref 78.0–100.0)
Platelets: 349 10*3/uL (ref 150–400)
RBC: 4.08 MIL/uL — ABNORMAL LOW (ref 4.22–5.81)
RDW: 14.2 % (ref 11.5–15.5)
WBC: 12.5 10*3/uL — ABNORMAL HIGH (ref 4.0–10.5)

## 2014-07-02 LAB — PROCALCITONIN: PROCALCITONIN: 0.31 ng/mL

## 2014-07-02 LAB — MAGNESIUM: Magnesium: 1.4 mg/dL — ABNORMAL LOW (ref 1.7–2.4)

## 2014-07-02 MED ORDER — MAGNESIUM SULFATE 2 GM/50ML IV SOLN
2.0000 g | Freq: Once | INTRAVENOUS | Status: AC
  Administered 2014-07-02: 2 g via INTRAVENOUS
  Filled 2014-07-02: qty 50

## 2014-07-02 NOTE — Consult Note (Signed)
WOC wound consult note Reason for Consult:Chronic venous insufficiency of the bilateral LEs, moisture associated dermatitis in the pannus skinfolds, purple area of discoloration in the right perineum (patient has fallen on his buttocks within the lat 10 days, wife and daughter state he fell on buttocks, patient states that he fell on his knees). Partial thickness tissue loss at sacrum that is linear and not pressure-related Wound type:See above. Pressure Ulcer POA: No Measurement: Purple discoloration in right perineum measures 8cm x 10cm and is not indurated.  Bruising on the let perineum is more faint and wife states is related to the fall (present longer). Linear area of partial thickness tissue loss at the sacrum measures 3cm x 0.2cm x 0.2cm with pink, moist wound bed. Bi.ateral LEs with chronic venous insufficiency, hemosiderin staining and flaking plaques of skin noted.  Left elbow with partial thickness skin loss measuring 3cm x 2cm  Wound bed:As described above Drainage (amount, consistency, odor) Scant serous from elbow and sacrum. None from other areas Periwound:intact, dry. Dressing procedure/placement/frequency:Patient is on a therapeutic bariatric mattress with low air loss feature.  Bilateral pressure redistribution boots are provided as well as guidance for the care of the skin on the LEs.  We will use a silicone dressing over the sacral area and elbow and protect the perianal skin with use of our cleanser and moisture barrier ointment.  The antimicrobial textile (InterDry Ag+) is in use in the intertriginous areas where dermatitis is resolving. WOC nursing team will not follow, but will remain available to this patient, the nursing and medical teams.  Please re-consult if needed. Thanks, Ladona Mow, MSN, RN, GNP, [place], CWON-AP [phone])

## 2014-07-02 NOTE — Progress Notes (Signed)
Patient ID: John Paul, male   DOB: 23-Jul-1940, 74 y.o.   MRN: [id number] [facility] PROGRESS NOTE  John Paul XIH:[id number] DOB: [birth year] DOA: 06/22/2014 PCP: Chesley Noon, MD  Brief narrative:    74 year-old male with past medical history of hypertension, atrial fibrillation on anticoagulation with xarelto, diabetes, dyslipidemia who presented to ED with ongoing nausea, diarrhea and resultant hypotension. Patient was initially under critical care medicine service until his blood pressure was stabilized with aggressive fluid resuscitation. [facility] assumed care from 06/26/2014. Patient was initially on vancomycin, Zosyn, Flagyl. All antibodies were discontinued by 6/10 and flagyl 6/13 since blood cultures, urine culture, C. difficile and GI pathogen panel were all negative.  The hospital course was complicated with development of atrial fibrillation with RVR requiring Cardizem drip. Patient was seen by cardiology in consultation. Cardizem drip was subsequently discontinued on 06/30/2014 because of hypotension.  On 07/01/2014 afternoon patient developed more respiratory distress, wheezing. He was also hypotensive and spiked fever off 100.44F. Concern was that he may have sepsis so sepsis protocol initiated and he was again started on empiric vancomycin and Zosyn. Critical care was consulted for input on management of sepsis and hypotension.   Assessment/Plan:    Principal problem: Acute respiratory failure with hypoxia - Patient is more respiratory distress on 07/01/2014. He required BiPAP temporarily overnight to keep oxygen saturation above 90%. - Chest x-ray done 07/01/2014 revealed pulmonary vascular congestion so patient was given one dose of Lasix 40 mg IV. Systolic blood pressure at that time was just about 110. Per pulmonary he is on lasix 40 mg IV BID. Continue to monitor BP while on this regimen. - Patient is currently on nasal cannula oxygen support and his oxygen  saturation is 95%. No wheezing appreciated on physical exam this morning. - Patient was placed on vancomycin and Zosyn again 07/01/2014 empirically until blood culture results are available. - Critical care has seen the patient in consultation on 07/01/2014. Appreciated their recommendations.  Active problems: Sepsis / leukocytosis - Sepsis criteria met initially with tachycardia, tachypnea, leukocytosis in addition to fever on admission. Patient also had elevated lactic acid and procalcitonin level.  - Patient was initially on Zosyn and vancomycin which were discontinued 06/26/2014. He was also on Flagyl for possible C. difficile but this was stopped once C. difficile was negative, 06/29/2014. - Since there was concern for sepsis, patient met sepsis criteria 07/01/2014 with fever, tachycardia, tachypnea, hypoxia and hypotension in addition to persistent leukocytosis sepsis workup was initiated. Vancomycin and Zosyn started 07/01/2014. - As mentioned, blood cultures, urine culture, C. difficile and GI pathogen panel all negative from the admission. Follow-up blood cultures obtained on 07/01/2014.  Diarrhea / dehydration / hypotension - Unclear etiology of diarrhea. GI pathogen panel and stool for C. difficile are all negative. - Diarrhea resolved. - Patient is still receiving IV fluids for hypotension. - Diet as tolerated.  Atrial fibrillation with RVR - Patient developed rapid ventricular rate on 06/27/2014 and has required Cardizem drip for 24 hour period. Then he was off of Cardizem drip and on home dose of atenolol and went back into atrial fibrillation on 06/28/2016 and was back on Cardizem drip through 06/30/2014. - Per cardiology, okay to resume atenolol home dose for HR control. No current plan for cardioversion because of initially questionable acute gastroenteritis. - Current HR in low 100's.  - CHADS vasc score at least 3 - Continue anticoagulation with xarelto.  Non-anion gap  metabolic acidosis - Likely secondary to profound diarrhea -  Resolved   Hyponatremia - Secondary to GI losses. - Sodium normalized with IV fluids.  Hypomagnesemia - Supplemented.  Diabetes mellitus, controlled - A1c on this admission is 6.0 indicating good glycemic control. - Continue sliding scale insulin. - CBGs in past 24 hours: 146, 128, 131.  Dyslipidemia - Continue statin therapy  Morbid obesity with comorbid conditions including diabetes, hypertension, dyslipidemia - Body mass index is 51.08 kg/(m^2). - Nutrition consulted   OSA, not on CPAP - Stable.   DVT Prophylaxis  - SCDs bilaterally   Code Status: Full.  Family Communication:  plan of care discussed with the patient and his wife at the bedside. Disposition Plan: Remains in step down unit because of hypotension.  IV access:  Peripheral IV  Procedures and diagnostic studies:    Dg Chest Port 1 View 07/14/2014    Pulmonary vascular congestion.  Chronic cardiomegaly.     Dg Chest Port 1 View 06/27/2014  Stable chest with mild cardiomegaly.  No acute findings demonstrated     Dg Abd Portable 1v 06/27/2014  No acute findings or explanation for the patient's symptoms. Examination is limited by body habitus and breathing artifact.     Dg Chest Port 1 View 07/03/2014 Chronic cardiomegaly without acute cardiopulmonary disease.     Medical Consultants:  Cardiology   Other Consultants:  Physical therapy  IAnti-Infectives:   Vancomycin 6/9>>>6/10; 07/14/14 -->  Zosyn 6/9>>>6/10; 07/02/2014 -->  Flagyl 6/9>>>6/13   Leisa Lenz, MD  [facility] Pager [phone]  Time spent in minutes: 25 minutes  If 7PM-7AM, please contact night-coverage [email or web address] Password [email or web address] 07/02/2014, 10:00 AM   LOS: 7 days    HPI/Subjective: Yesterday afternoon he pt in more respiratory distress. Now feels better.   Objective: Filed Vitals:   07/02/14 0457 07/02/14 0500 07/02/14 0600 07/02/14 0800  BP:   126/52  97/57  Pulse:  100 103 105  Temp:    97.7 F (36.5 C)  TempSrc:    Oral  Resp:  $Remo'21 22 23  'sgKWQ$ Height:      Weight: 162.4 kg (358 lb 0.4 oz)     SpO2:  98% 99% 95%    Intake/Output Summary (Last 24 hours) at 07/02/14 1000 Last data filed at 07/02/14 0600  Gross per 24 hour  Intake 2762.92 ml  Output   1625 ml  Net 1137.92 ml    Exam:   General:  Pt is alert, not in acute distress,   Cardiovascular: tachycardic, S1/S2 (+)  Respiratory: No wheezing, no crackles, no rhonchi  Abdomen: Non tender, non distended, (+) BS   Extremities: No leg swelling, pulses palpable   Neuro: Nonfocal  Data Reviewed: Basic Metabolic Panel:  Recent Labs Lab 06/26/14 0406 06/27/14 0403 06/28/14 0414 06/29/14 0348 07-14-14 0335 07/14/14 1000 07/02/14 0800  NA 137 140 137 135 135  --  138  K 4.5 3.9 3.5 3.8 4.3  --  3.9  CL 110 110 107 107 106  --  103  CO2 19* 21* 21* 22 22  --  24  GLUCOSE 133* 141* 139* 155* 143*  --  142*  BUN 37* 23* $Remov'17 17 15  'fWfvem$ --  17  CREATININE 1.57* 1.25* 0.95 1.04 0.88  --  0.93  CALCIUM 7.9* 7.7* 7.4* 7.3* 7.3*  --  7.4*  MG 1.6* 1.3* 1.4* 1.5* 1.5* 1.4* 1.4*  PHOS 2.9  --   --   --   --   --   --    Liver Function Tests:  Recent Labs Lab 07/10/2014 1653 06/27/14 0403 07/02/14 0800  AST $Re'18 16 17  'okX$ ALT 15* 14* 11*  ALKPHOS 71 66 70  BILITOT 0.8 0.4 0.4  PROT 6.1* 5.3* 5.2*  ALBUMIN 2.8* 2.3* 1.9*   No results for input(s): LIPASE, AMYLASE in the last 168 hours.  Recent Labs Lab 06/28/2014 1653  AMMONIA 11   CBC:  Recent Labs Lab 07/08/2014 1545 06/26/14 0406 06/28/14 0414 06/29/14 0348 07/01/14 1000  WBC 25.2* 19.8* 16.4* 17.2* 14.7*  HGB 14.2 12.2* 12.5* 12.3* 11.8*  HCT 43.3 37.2* 37.9* 37.9* 36.2*  MCV 94.5 96.1 95.7 95.7 94.3  PLT 292 271 327 333 327   Cardiac Enzymes:  Recent Labs Lab 06/21/2014 2241 06/26/14 0406 06/26/14 1220  TROPONINI <0.03 <0.03 <0.03   BNP: Invalid input(s): POCBNP CBG:  Recent Labs Lab  06/30/14 2053 07/01/14 0730 07/01/14 1218 07/01/14 1622 07/02/14 0801  GLUCAP 164* 150* 146* 128* 131*    Blood culture (routine x 2)     Status: None   Collection Time: 06/20/2014  3:28 PM  Result Value Ref Range Status   Specimen Description BLOOD RIGHT ARM  Final   Culture   Final    NO GROWTH 5 DAYS   Report Status 07/01/2014 FINAL  Final  Blood culture (routine x 2)     Status: None   Collection Time: 06/19/2014  3:29 PM  Result Value Ref Range Status   Specimen Description BLOOD LEFT ARM  Final   Culture   Final    NO GROWTH 5 DAYS   Report Status 07/01/2014 FINAL  Final  Urine culture     Status: None   Collection Time: 06/28/2014  5:46 PM  Result Value Ref Range Status   Specimen Description Urine  Final   Special Requests Normal  Final   Culture NO GROWTH   Final   Report Status 06/26/2014 FINAL  Final  MRSA PCR Screening     Status: None   Collection Time: 06/27/2014 10:01 PM  Result Value Ref Range Status   MRSA by PCR NEGATIVE NEGATIVE Final  Culture, Urine     Status: None   Collection Time: 06/26/14  1:45 AM  Result Value Ref Range Status   Specimen Description URINE, CATHETERIZED  Final   Special Requests NONE  Final   Culture NO GROWTH  Final   Report Status 06/27/2014 FINAL  Final  Culture, Urine     Status: None   Collection Time: 06/27/14 11:51 AM  Result Value Ref Range Status   Specimen Description URINE, CATHETERIZED  Final   Special Requests NONE  Final   Culture NO GROWTH  Final   Report Status 06/28/2014 FINAL  Final  Culture, blood (routine x 2)     Status: None (Preliminary result)   Collection Time: 06/27/14 12:47 PM  Result Value Ref Range Status   Specimen Description BLOOD LEFT ARM  Final   Culture   Final           BLOOD CULTURE RECEIVED NO GROWTH TO DATE    Report Status PENDING  Incomplete  Culture, blood (routine x 2)     Status: None (Preliminary result)   Collection Time: 06/27/14 12:54 PM  Result Value Ref Range Status   Specimen  Description BLOOD LEFT ARM  Final   Special Requests   Final    BOTTLES DRAWN AEROBIC AND ANAEROBIC 10CC BOTH BOTTLES   Culture   Final  BLOOD CULTURE RECEIVED NO GROWTH TO DATE    Report Status PENDING  Incomplete  Clostridium Difficile by PCR (not at [facility])     Status: None   Collection Time: 06/27/14  6:00 PM  Result Value Ref Range Status   C difficile by pcr NEGATIVE NEGATIVE Final     Scheduled Meds: . atenolol  100 mg Oral Daily  . atorvastatin  10 mg Oral QHS  . furosemide  40 mg Intravenous BID  . insulin aspart  0-20 Units Subcutaneous TID WC  . insulin aspart  0-5 Units Subcutaneous QHS  . nystatin  5 mL Oral QID  . nystatin   Topical TID  . piperacillin-tazobactam   3.375 g Intravenous Q8H  . rivaroxaban  20 mg Oral Daily  . vancomycin  1,250 mg Intravenous Q12H   Continuous Infusions: . sodium chloride Stopped (06/29/14 0806)  . sodium chloride 50 mL/hr at 07/01/14 2200  . diltiazem (CARDIZEM) infusion Stopped (06/29/14 0930)

## 2014-07-02 NOTE — Progress Notes (Signed)
Date:  July 02, 2014 U.R. performed for needs and level of care. New onset of poss sepsis symptoms. Will continue to follow for Case Management needs.  Marcelle Smiling, RN, BSN, [place]   [phone]

## 2014-07-02 NOTE — Progress Notes (Signed)
07/02/2014  11:10 PM  Patient refuses to wear CPAP. Coralie Carpen NP made aware. Patient's wife at bedside.  Will continue to monitor and update as needed.  Carlyon Prows RN

## 2014-07-02 NOTE — Progress Notes (Signed)
   07/02/14 2228  BiPAP/CPAP/SIPAP  BiPAP/CPAP/SIPAP Pt Type Adult  Mask Type Full face mask  Mask Size Large  Respiratory Rate 20 breaths/min  IPAP (I MAX =20)  EPAP (E MIN= 5)  Flow Rate 2 lpm  BiPAP/CPAP/SIPAP BiPAP  Patient Home Equipment No  Auto Titrate Yes  BiPAP/CPAP /SiPAP Vitals  Pulse Rate (!) 103  Resp (!) 22  SpO2 98 %

## 2014-07-03 DIAGNOSIS — E873 Alkalosis: Secondary | ICD-10-CM

## 2014-07-03 DIAGNOSIS — D72829 Elevated white blood cell count, unspecified: Secondary | ICD-10-CM | POA: Diagnosis present

## 2014-07-03 DIAGNOSIS — A419 Sepsis, unspecified organism: Secondary | ICD-10-CM | POA: Diagnosis present

## 2014-07-03 DIAGNOSIS — IMO0001 Reserved for inherently not codable concepts without codable children: Secondary | ICD-10-CM

## 2014-07-03 DIAGNOSIS — R0989 Other specified symptoms and signs involving the circulatory and respiratory systems: Secondary | ICD-10-CM

## 2014-07-03 LAB — CULTURE, BLOOD (ROUTINE X 2)
CULTURE: NO GROWTH
CULTURE: NO GROWTH

## 2014-07-03 LAB — GLUCOSE, CAPILLARY
GLUCOSE-CAPILLARY: 141 mg/dL — AB (ref 65–99)
Glucose-Capillary: 124 mg/dL — ABNORMAL HIGH (ref 65–99)
Glucose-Capillary: 144 mg/dL — ABNORMAL HIGH (ref 65–99)

## 2014-07-03 MED ORDER — GLUCERNA SHAKE PO LIQD
237.0000 mL | Freq: Three times a day (TID) | ORAL | Status: DC | PRN
  Filled 2014-07-03: qty 237

## 2014-07-03 MED ORDER — GABAPENTIN 100 MG PO CAPS
100.0000 mg | ORAL_CAPSULE | Freq: Two times a day (BID) | ORAL | Status: DC
  Administered 2014-07-03 – 2014-07-15 (×19): 100 mg via ORAL
  Filled 2014-07-03 (×25): qty 1

## 2014-07-03 MED ORDER — ACETAMINOPHEN 325 MG PO TABS
650.0000 mg | ORAL_TABLET | Freq: Four times a day (QID) | ORAL | Status: DC | PRN
Start: 2014-07-03 — End: 2014-07-16
  Administered 2014-07-03 – 2014-07-06 (×2): 650 mg via ORAL
  Filled 2014-07-03 (×2): qty 2

## 2014-07-03 NOTE — Progress Notes (Signed)
PULMONARY / CRITICAL CARE MEDICINE   Name: John Paul MRN: [id number] DOB: 1940/03/02    ADMISSION DATE:  06/26/2014 CONSULTATION DATE:  07/03/2014  REFERRING MD :  Charlies Silvers  CHIEF COMPLAINT:  Diarrhea  INITIAL PRESENTATION:  74 y.o. M initially brought to [facility] ED 6/9 with diarrhea x 6 days.  Found to have hypotension with minimal improvement after 4L crystalloid.  Admitted by PCCM and transferred to [facility] following day.  Abx were d/c'd as all cultures remained negative.  Hospital course complicated by a.fib with RVR for which he was placed on cardizem which has since been stopped (now back on baseline atenolol only).  On 6/15, pt had mild fever to 100.7 with continued intermittent hypotension therefore primary team concerned for sepsis.  Workup initiated and pt placed back on Vanc / Zosyn.  Pt was also slightly tachypneic and apparently had mild respiratory distress; therefore, PCCM was re-consulted.   STUDIES:  CXR 6/15 >>> vascular congestion.  SIGNIFICANT EVENTS: 6/9 - admitted after 6 days diarrhea with decreased PO.   6/10 - [facility] assumed care. 6/11 - A.fib with RVR > started on cardizem gtt. 6/13 - back in RVR after cardizem stopped; therefore cardizem resumed for 24 hrs 6/15 - PCCM called for respiratory distress.  Placed back on vanc / zosyn and re-cultured for concerns of sepsis. 6/16 refusing CPAP at HS   SUBJECTIVE:  Feels depressed   VITAL SIGNS: Temp:  [97.8 F (36.6 C)-100.3 F (37.9 C)] 98.4 F (36.9 C) (06/17 0800) Pulse Rate:  [51-112] 103 (06/17 0800) Resp:  [19-32] 19 (06/17 0800) BP: (81-110)/(38-69) 110/45 mmHg (06/17 0800) SpO2:  [83 %-100 %] 98 % (06/17 0800) Weight:  [164.1 kg (361 lb 12.4 oz)] 164.1 kg (361 lb 12.4 oz) (06/17 0500) Room air       INTAKE / OUTPUT: Intake/Output      06/16 0701 - 06/17 0700 06/17 0701 - 06/18 0700   P.O. 240    I.V. (mL/kg) 1200 (7.3)    IV Piggyback 450    Total Intake(mL/kg) 1890 (11.5)    Urine (mL/kg/hr) 5400  (1.4)    Stool 0 (0)    Total Output 5400     Net -3510          Stool Occurrence 1 x      PHYSICAL EXAMINATION: General: Morbidly obese adult male, in NAD. Won't make eye contact Neuro: A&O x 3, non-focal.  HEENT: [place]/AT. PERRL, sclerae anicteric. MM dry. Cardiovascular: IRIR, no M/R/G.  Lungs: Respirations even and unlabored.  CTA bilaterally. Abdomen: Morbidly obese.  BS not heard, abd soft, NT/ND.  Musculoskeletal: No gross deformities, no edema.  Skin: Intact, warm, no rashes.  LABS:  CBC  Recent Labs Lab 06/29/14 0348 07/01/14 1000 07/02/14 0800  WBC 17.2* 14.7* 12.5*  HGB 12.3* 11.8* 12.7*  HCT 37.9* 36.2* 38.7*  PLT 333 327 349   Coag's No results for input(s): APTT, INR in the last 168 hours. BMET  Recent Labs Lab 06/29/14 0348 07/01/14 0335 07/02/14 0800  NA 135 135 138  K 3.8 4.3 3.9  CL 107 106 103  CO2 _0 BUN _1 CREATININE 1.04 0.88 0.93  GLUCOSE 155* 143* 142*   Electrolytes  Recent Labs Lab 06/29/14 0348 07/01/14 0335 07/01/14 1000 07/02/14 0800  CALCIUM 7.3* 7.3*  --  7.4*  MG 1.5* 1.5* 1.4* 1.4*   Sepsis Markers  Recent Labs Lab 06/29/14 0348 07/01/14 1737 07/01/14 1805 07/01/14 1909 07/02/14  0800  LATICACIDVEN  --   --  1.6 1.6  --   PROCALCITON 0.72 0.37  --   --  0.31   ABG  Recent Labs Lab 06/26/14 2115 07/01/14 1815  PHART 7.444 7.513*  PCO2ART 25.5* 27.1*  PO2ART 85.1 79.1*   Liver Enzymes  Recent Labs Lab 06/27/14 0403 07/02/14 0800  AST 16 17  ALT 14* 11*  ALKPHOS 66 70  BILITOT 0.4 0.4  ALBUMIN 2.3* 1.9*   Cardiac Enzymes  Recent Labs Lab 06/26/14 1220  TROPONINI <0.03   Glucose  Recent Labs Lab 07/01/14 1622 07/01/14 2138 07/02/14 0801 07/02/14 1128 07/02/14 1654 07/02/14 2151  GLUCAP 128* 142* 131* 138* 133* 124*    Imaging Dg Forearm Left  07/02/2014   CLINICAL DATA:  Fall last week with right wrist and forearm pain. Pain with supination.  EXAM: LEFT FOREARM -  2 VIEW  COMPARISON:  None.  FINDINGS: There are mild degenerative changes over the elbow and wrist as well as first carpometacarpal joint. No acute fracture or dislocation.  IMPRESSION: No acute findings.   Electronically Signed   By: Marin Olp M.D.   On: 07/02/2014 10:56   ASSESSMENT / PLAN: Acute hypoxic respiratory failure in setting of Decompensated OSA/OHS prob secondary PAH and RV dysfxn w/  Pulmonary edema - s/p 60m lasix x 1 with good improvement. Primary resp alk (not sure if this was on BIPAP or not) Severe protein calorie malnutrition  Severe physical deconditioning depression Plan: Monitor nocturnal O2 sats. If desaturates would at least consider O2 at night IF he continues to refuse CPAP Pulmonary hygiene. MOBILIZE-->need PT/OT Lasix PRN Would consider starting SSRI or something for depression.    Concern for sepsis - not overly impressed though will consider given very mild fever 6/15 along with persistent mild leukocytosis and intermittent hypotension (? Accuracy of cuff given body habitus and cuff location on forearm).  qSOFA score 2. Plan: Re-cultured 6/15. PCT is down to 0.37. I would recommend d/c abx and follow clinical course.   Rest per primary. We will s/o.   PErick ColaceACNP-BC LVillasPager # [phone] # [phone] no answer   Attending Note:  I have examined patient, reviewed labs, studies and notes. I have discussed the case with PJerrye Bushy and I agree with the data and plans as amended above. Please call if we can assist in any way.   RBaltazar Apo MD, PhD 07/03/2014, 10:47 AM [facility] [phone] if no answer [phone]

## 2014-07-03 NOTE — Progress Notes (Addendum)
Patient ID: John Paul, male   DOB: May 08, 1940, 74 y.o.   MRN: [id number] [facility] PROGRESS NOTE  John Paul HYQ:[id number] DOB: 09-21-1940 DOA: 07/14/2014 PCP: Chesley Noon, MD  Brief narrative:    74 year-old male with past medical history of hypertension, atrial fibrillation on anticoagulation with xarelto, diabetes, dyslipidemia who presented to ED with ongoing nausea, diarrhea and resultant hypotension. Patient was initially under critical care medicine service until his blood pressure was stabilized with aggressive fluid resuscitation. [facility] assumed care from 06/26/2014. Patient was initially on vancomycin, Zosyn, Flagyl. All antibodies were discontinued by 6/10 and flagyl 6/13 since blood cultures, urine culture, C. difficile and GI pathogen panel were all negative.  The hospital course was complicated with development of atrial fibrillation with RVR requiring Cardizem drip. Patient was seen by cardiology in consultation. Cardizem drip was subsequently discontinued on 06/30/2014 because of hypotension.  On 07/01/2014 afternoon patient developed more respiratory distress, wheezing. He was also hypotensive and spiked fever off 100.46F. Concern was that he may have sepsis so sepsis protocol initiated and he was again started on empiric vancomycin and Zosyn. Critical care has seen the patient in consultation and 07/03/2014 they are recommending to consider stopping antibodies and follow clinical course.   Assessment/Plan:    Principal problem: Acute respiratory failure with hypoxia / pulmonary vascular congestion / hypotension - Patient is more respiratory distress on 07/01/2014. He required BiPAP temporarily overnight on 07/02/2014 to keep oxygen saturation above 90%. - Chest x-ray done 07/01/2014 revealed pulmonary vascular congestion so patient was given one dose of Lasix 40 mg IV. Systolic blood pressure at that time was just about 110. Per pulmonary, continue Lasix 40 mg IV  twice a day. Patient's current blood pressure is 96/44. - Respiratory status is stable and patient is saturating 97% with nasal cannula oxygen support. - As mentioned, he was on vancomycin and Zosyn initially which was then stopped because all cultures were negative. Sepsis protocol initiated on 07/01/2014 because of fever, hypertension, hypoxia. Blood cultures from 07/01/2014 show no growth. Pulmonary is recommending to stop antibodies and follow clinical course. - We will continue to monitor in step down unit because of ongoing hypotension.  Active problems: Sepsis / leukocytosis - Sepsis criteria met initially with tachycardia, tachypnea, leukocytosis in addition to fever on admission. Patient also had elevated lactic acid and procalcitonin level.  - Patient was initially on Zosyn and vancomycin and they were discontinued 06/26/2014. He was also on Flagyl for possible C. difficile but this was stopped once C. difficile was negative, 06/29/2014. - On 07/01/2014 there was a concern for sepsis.Sepsis criteria met on 07/01/2014 with fever, tachycardia, tachypnea, hypoxia and hypotension in addition to persistent leukocytosis. Sepsis workup was initiated 07/01/14. Vancomycin and Zosyn started 07/01/2014. - As mentioned, blood cultures, urine culture, C. difficile and GI pathogen panel all negative from the admission. Blood cultures obtained on 07/01/2014 also show no growth. - We will stop antibodies today per pulmonary recommendations and follow clinical course.  Diarrhea / dehydration / hypotension - Unclear etiology of diarrhea. GI pathogen panel and stool for C. difficile are all negative. - Diarrhea resolved. - Diet as tolerated.  Chronic bilateral lower extremity venous insufficiency / right perineal area discoloration  - Appreciate wound care assessment. - Bilateral pressure redistribution boots are provided - Recommended to use silicone dressing over the sacral area and protect the perianal  skin with use of cleanser and moisture barrier ointment.   Atrial fibrillation with RVR - Patient developed rapid  ventricular rate on 06/27/2014 and has required Cardizem drip for 24 hour period. Then he was off of Cardizem drip and on home dose of atenolol and went back into atrial fibrillation on 06/28/2016 and was back on Cardizem drip through 06/30/2014. - Per cardiology, continue atenolol home dose for HR control.  - Current HR in low 100's.  - CHADS vasc score at least 3 - Continue anticoagulation with xarelto.  Non-anion gap metabolic acidosis - Likely secondary to profound diarrhea - Resolved   Hyponatremia - Secondary to GI losses. - Sodium normalized with IV fluids.  HypomagnesemiaThe only one: He was allowed him recommendation over the phone and didn't even see the patient with no charge and allergist with resection- Supplemented.  Diabetes mellitus, controlled - A1c on this admission is 6.0 indicating good glycemic control. - Continue sliding scale insulin. - CBGs in past 24 hours: 143, 142  Dyslipidemia - Continue statin therapy  Morbid obesity with comorbid conditions including diabetes, hypertension, dyslipidemia - Body mass index is 51.08 kg/(m^2). - Nutrition consulted   OSA, not on CPAP - Stable.   DVT Prophylaxis  - SCDs bilaterally   Code Status: Full.  Family Communication:  plan of care discussed with the patient and his wife at the bedside. Disposition Plan: Remains in step down unit because of ongoing hypotension.  IV access:  Peripheral IV  Procedures and diagnostic studies:    Dg Chest Port 1 View 08-Jul-2014    Pulmonary vascular congestion.  Chronic cardiomegaly.     Dg Chest Port 1 View 06/27/2014  Stable chest with mild cardiomegaly.  No acute findings demonstrated     Dg Abd Portable 1v 06/27/2014  No acute findings or explanation for the patient's symptoms. Examination is limited by body habitus and breathing artifact.     Dg Chest  Port 1 View 07/03/2014 Chronic cardiomegaly without acute cardiopulmonary disease.     Medical Consultants:  Cardiology  Pulmonary  Other Consultants:  Physical therapy Wound care  IAnti-Infectives:   Vancomycin 6/9>>>6/10; 07/08/14 --> 07/03/2014 Zosyn 6/9>>>6/10; 07/02/2014 --> 07/03/2014 Flagyl 6/9>>>6/13   Leisa Lenz, MD  [facility] Pager [phone]  Time spent in minutes: 25 minutes  If 7PM-7AM, please contact night-coverage [email or web address] Password [email or web address] 07/03/2014, 1:24 PM   LOS: 8 days    HPI/Subjective: No overnight issues. No shortness of breath.   Objective: Filed Vitals:   07/03/14 1200 07/03/14 1205 07/03/14 1208 07/03/14 1300  BP: 78/46 96/44    Pulse: 95 98  104  Temp:   98.4 F (36.9 C)   TempSrc:   Oral   Resp: $Remo'28 31  27  'KFZiX$ Height:      Weight:      SpO2: 97% 98%  97%    Intake/Output Summary (Last 24 hours) at 07/03/14 1324 Last data filed at 07/03/14 1300  Gross per 24 hour  Intake   1370 ml  Output   5275 ml  Net  -3905 ml    Exam:   General:  Pt is not in acute distress  Cardiovascular: Tachycardic, regular rhythm, appreciate S1-S2  Respiratory: Bilateral air entry, no crackles or wheezing  Abdomen: Obese abdomen, no tenderness, no distention, appreciate bowel sounds  Extremities: Bilateral lower extremity chronic skin changes, pulses palpable  Neuro: No focal neurological deficits  Data Reviewed: Basic Metabolic Panel:  Recent Labs Lab 06/27/14 0403 06/28/14 0414 06/29/14 0348 2014-07-08 0335 07/08/14 1000 07/02/14 0800  NA 140 137 135 135  --  138  K 3.9 3.5  3.8 4.3  --  3.9  CL 110 107 107 106  --  103  CO2 21* 21* 22 22  --  24  GLUCOSE 141* 139* 155* 143*  --  142*  BUN 23* $Remov'17 17 15  'anIgTZ$ --  17  CREATININE 1.25* 0.95 1.04 0.88  --  0.93  CALCIUM 7.7* 7.4* 7.3* 7.3*  --  7.4*  MG 1.3* 1.4* 1.5* 1.5* 1.4* 1.4*   Liver Function Tests:  Recent Labs Lab 06/27/14 0403 07/02/14 0800  AST 16 17  ALT 14* 11*   ALKPHOS 66 70  BILITOT 0.4 0.4  PROT 5.3* 5.2*  ALBUMIN 2.3* 1.9*   No results for input(s): LIPASE, AMYLASE in the last 168 hours. No results for input(s): AMMONIA in the last 168 hours. CBC:  Recent Labs Lab 06/28/14 0414 06/29/14 0348 07/01/14 1000 07/02/14 0800  WBC 16.4* 17.2* 14.7* 12.5*  HGB 12.5* 12.3* 11.8* 12.7*  HCT 37.9* 37.9* 36.2* 38.7*  MCV 95.7 95.7 94.3 94.9  PLT 327 333 327 349   Cardiac Enzymes: No results for input(s): CKTOTAL, CKMB, CKMBINDEX, TROPONINI in the last 168 hours. BNP: Invalid input(s): POCBNP CBG:  Recent Labs Lab 07/01/14 2138 07/02/14 0801 07/02/14 1128 07/02/14 1654 07/02/14 2151  GLUCAP 142* 131* 138* 133* 124*    Blood culture (routine x 2)     Status: None   Collection Time: 07/05/2014  3:28 PM  Result Value Ref Range Status   Specimen Description BLOOD RIGHT ARM  Final   Culture   Final    NO GROWTH 5 DAYS   Report Status 07/01/2014 FINAL  Final  Blood culture (routine x 2)     Status: None   Collection Time: 07/06/2014  3:29 PM  Result Value Ref Range Status   Specimen Description BLOOD LEFT ARM  Final   Culture   Final    NO GROWTH 5 DAYS   Report Status 07/01/2014 FINAL  Final  Urine culture     Status: None   Collection Time: 06/30/2014  5:46 PM  Result Value Ref Range Status   Specimen Description Urine  Final   Special Requests Normal  Final   Culture NO GROWTH   Final   Report Status 06/26/2014 FINAL  Final  MRSA PCR Screening     Status: None   Collection Time: 06/17/2014 10:01 PM  Result Value Ref Range Status   MRSA by PCR NEGATIVE NEGATIVE Final  Culture, Urine     Status: None   Collection Time: 06/26/14  1:45 AM  Result Value Ref Range Status   Specimen Description URINE, CATHETERIZED  Final   Special Requests NONE  Final   Culture NO GROWTH  Final   Report Status 06/27/2014 FINAL  Final  Culture, Urine     Status: None   Collection Time: 06/27/14 11:51 AM  Result Value Ref Range Status   Specimen  Description URINE, CATHETERIZED  Final   Special Requests NONE  Final   Culture NO GROWTH  Final   Report Status 06/28/2014 FINAL  Final  Culture, blood (routine x 2)     Status: None (Preliminary result)   Collection Time: 06/27/14 12:47 PM  Result Value Ref Range Status   Specimen Description BLOOD LEFT ARM  Final   Culture   Final           BLOOD CULTURE RECEIVED NO GROWTH TO DATE    Report Status PENDING  Incomplete  Culture, blood (routine x 2)  Status: None (Preliminary result)   Collection Time: 06/27/14 12:54 PM  Result Value Ref Range Status   Specimen Description BLOOD LEFT ARM  Final   Special Requests   Final    BOTTLES DRAWN AEROBIC AND ANAEROBIC 10CC BOTH BOTTLES   Culture   Final           BLOOD CULTURE RECEIVED NO GROWTH TO DATE    Report Status PENDING  Incomplete  Clostridium Difficile by PCR (not at [facility])     Status: None   Collection Time: 06/27/14  6:00 PM  Result Value Ref Range Status   C difficile by pcr NEGATIVE NEGATIVE Final     Scheduled Meds: . atenolol  100 mg Oral Daily  . atorvastatin  10 mg Oral QHS  . furosemide  40 mg Intravenous BID  . insulin aspart  0-20 Units Subcutaneous TID WC  . insulin aspart  0-5 Units Subcutaneous QHS  . nystatin  5 mL Oral QID  . nystatin   Topical TID  . piperacillin-tazobactam   3.375 g Intravenous Q8H  . rivaroxaban  20 mg Oral Daily  . vancomycin  1,250 mg Intravenous Q12H   Continuous Infusions: . sodium chloride Stopped (06/29/14 0806)  . sodium chloride 50 mL/hr at 07/03/14 0700  . diltiazem (CARDIZEM) infusion Stopped (06/29/14 0930)

## 2014-07-03 NOTE — Progress Notes (Signed)
Nutrition Education Note  RD consulted for nutrition education regarding morbid obesity. Pt has history of diabetes; blood sugars well controlled with hemoglobin A1C at 6%.  Pt asleep at time of visit x 2 attempts. RD spoke with pt's wife at bedside and discussed the benefits of a healthful diet. RD provided "General Healthful Nutrition Therapy" handout from the Academy of [employer]. Provided list of recommended foods and list of less healthful foods to avoid. Encouraged fresh fruits and vegetables as well as whole grain sources of carbohydrates to maximize fiber intake.  Encouraged healthful eating to speed up recovery process. RD contact information provided. Encouraged wife to share handouts with husband and have him call RD with questions.   Body mass index is 51.91 kg/(m^2). Pt meets criteria for Morbid Obesity based on current BMI.  Current diet order is Heart Healthy/Carb Modified, patient is consuming approximately 25% of meals at this time. Wife states that pt doesn't have much of an appetite. RD will supply Glucerna Shakes PRN. Labs and medications reviewed. Please consider re-consulting RD when pt is out of the ICU and more stable/alert for education.   Ian Malkin RD, LDN Inpatient Clinical Dietitian Pager: [phone] After Hours Pager: [phone]

## 2014-07-03 NOTE — Progress Notes (Addendum)
Physical Therapy Treatment Patient Details Name: John Paul MRN: [id number] DOB: 01/14/1941 Today's Date: 07/03/2014    History of Present Illness pt was admitted for dehydration:  had nausea, diarrhea, and hpotension.  PMH significant for DM, morbid obesity and A Fib    PT Comments    Max encouragement for participation. Pt lacks motivation and puts forth minimal effort, however he did agree to sitting EOB this session. +4 assist to get to EOB. Sat EOB ~5 minutes with Min-Mod assist. Attempted to have pt work on cervical ROM and LE ROM. Will need SNF.   Follow Up Recommendations  SNF     Equipment Recommendations  None recommended by PT    Recommendations for Other Services OT consult     Precautions / Restrictions Precautions Precautions: Fall Precaution Comments: pt has pain in bil LEs and LUE Restrictions Weight Bearing Restrictions: No    Mobility  Bed Mobility Overal bed mobility: Needs Assistance Bed Mobility: Supine to Sit;Sit to Supine     Supine to sit: Total assist;+2 for physical assistance;+2 for safety/equipment Sit to supine: Total assist;+2 for physical assistance;+2 for safety/equipment   General bed mobility comments: +4 for bil LE and trunk. Utilized bedpad for scooting, positioning.   Transfers                    Ambulation/Gait                 Stairs            Wheelchair Mobility    Modified Rankin (Stroke Patients Only)       Balance Overall balance assessment: Needs assistance Sitting-balance support: Feet supported;Bilateral upper extremity supported Sitting balance-Leahy Scale: Poor Sitting balance - Comments: Sat EOB ~5-6 minutes with Min-Mod assist. Attempted to get pt to work on LE ROM and cervical ROM-Max cueing for pt to put forth effort.                             Cognition Arousal/Alertness: Awake/alert Behavior During Therapy: Flat affect (lacks motivation) Overall Cognitive Status:  Within Functional Limits for tasks assessed                      Exercises Total Joint Exercises Long Arc Quad: AAROM;Both;5 reps;Seated    General Comments        Pertinent Vitals/Pain Pain Assessment: Faces Pain Score: 8  Pain Location: bil LEs, bil UEs Pain Descriptors / Indicators: Grimacing;Moaning Pain Intervention(s): Limited activity within patient's tolerance;Repositioned;Monitored during session    Home Living                      Prior Function            PT Goals (current goals can now be found in the care plan section) Progress towards PT goals: Progressing toward goals (slowly)    Frequency  Min 2X/week    PT Plan Frequency needs to be updated    Co-evaluation             End of Session   Activity Tolerance: Patient limited by fatigue;Patient limited by pain Patient left: in bed;with call bell/phone within reach;with family/visitor present;with nursing/sitter in room     Time: 1217-1253 PT Time Calculation (min) (ACUTE ONLY): 36 min  Charges:  $Therapeutic Activity: 23-37 mins  G Codes:      Weston Anna, MPT Pager: [phone]

## 2014-07-04 ENCOUNTER — Encounter (HOSPITAL_COMMUNITY): Payer: Self-pay | Admitting: Radiology

## 2014-07-04 ENCOUNTER — Inpatient Hospital Stay (HOSPITAL_COMMUNITY): Payer: Medicare HMO

## 2014-07-04 DIAGNOSIS — D689 Coagulation defect, unspecified: Secondary | ICD-10-CM | POA: Diagnosis present

## 2014-07-04 DIAGNOSIS — K5289 Other specified noninfective gastroenteritis and colitis: Secondary | ICD-10-CM | POA: Insufficient documentation

## 2014-07-04 DIAGNOSIS — K529 Noninfective gastroenteritis and colitis, unspecified: Secondary | ICD-10-CM

## 2014-07-04 DIAGNOSIS — M791 Myalgia: Secondary | ICD-10-CM

## 2014-07-04 DIAGNOSIS — R509 Fever, unspecified: Secondary | ICD-10-CM | POA: Diagnosis present

## 2014-07-04 DIAGNOSIS — D649 Anemia, unspecified: Secondary | ICD-10-CM | POA: Diagnosis present

## 2014-07-04 DIAGNOSIS — L8990 Pressure ulcer of unspecified site, unspecified stage: Secondary | ICD-10-CM | POA: Diagnosis present

## 2014-07-04 DIAGNOSIS — M130 Polyarthritis, unspecified: Secondary | ICD-10-CM | POA: Insufficient documentation

## 2014-07-04 DIAGNOSIS — IMO0001 Reserved for inherently not codable concepts without codable children: Secondary | ICD-10-CM | POA: Insufficient documentation

## 2014-07-04 DIAGNOSIS — E8809 Other disorders of plasma-protein metabolism, not elsewhere classified: Secondary | ICD-10-CM | POA: Diagnosis present

## 2014-07-04 DIAGNOSIS — R571 Hypovolemic shock: Secondary | ICD-10-CM | POA: Diagnosis present

## 2014-07-04 DIAGNOSIS — N179 Acute kidney failure, unspecified: Secondary | ICD-10-CM | POA: Diagnosis present

## 2014-07-04 DIAGNOSIS — M2550 Pain in unspecified joint: Secondary | ICD-10-CM

## 2014-07-04 LAB — GLUCOSE, CAPILLARY
GLUCOSE-CAPILLARY: 148 mg/dL — AB (ref 65–99)
GLUCOSE-CAPILLARY: 129 mg/dL — AB (ref 65–99)
GLUCOSE-CAPILLARY: 123 mg/dL — AB (ref 65–99)
Glucose-Capillary: 140 mg/dL — ABNORMAL HIGH (ref 65–99)

## 2014-07-04 LAB — TROPONIN I: Troponin I: <0.03 ng/mL (ref <0.031)

## 2014-07-04 LAB — APTT: APTT: 45 s — AB (ref 24–37)

## 2014-07-04 LAB — HEPARIN LEVEL (UNFRACTIONATED): Heparin Unfractionated: >2.2 IU/mL — ABNORMAL HIGH (ref 0.30–0.70)

## 2014-07-04 MED ORDER — IOHEXOL 300 MG/ML  SOLN
100.0000 mL | Freq: Once | INTRAMUSCULAR | Status: AC | PRN
  Administered 2014-07-04: 125 mL via INTRAVENOUS

## 2014-07-04 MED ORDER — HEPARIN (PORCINE) IN NACL 100-0.45 UNIT/ML-% IJ SOLN
1200.0000 [IU]/h | INTRAMUSCULAR | Status: DC
  Administered 2014-07-04: 1200 [IU]/h via INTRAVENOUS
  Filled 2014-07-04: qty 250

## 2014-07-04 MED ORDER — IOHEXOL 300 MG/ML  SOLN
50.0000 mL | Freq: Once | INTRAMUSCULAR | Status: AC | PRN
  Administered 2014-07-04: 50 mL via ORAL

## 2014-07-04 NOTE — Progress Notes (Signed)
eLink Physician-Brief Progress Note Patient Name: John Paul DOB: May 01, 1940 MRN: [id number]   Date of Service  07/04/2014  HPI/Events of Note  Asked by [facility] to assess CT scan/PE  eICU Interventions  Will order Troponin. However would start heparin drip and discussed with rama     Intervention Category Intermediate Interventions: Diagnostic test evaluation  Shan Levans 07/04/2014, 5:22 PM

## 2014-07-04 NOTE — Progress Notes (Signed)
Pt refusing CPAP/BIPAP at this time, RT to monitor and assess as needed.

## 2014-07-04 NOTE — Progress Notes (Signed)
ANTICOAGULATION CONSULT NOTE - Initial Consult  Pharmacy Consult for heparin Indication: new  pulmonary embolus  No Known Allergies  Patient Measurements: Height: 5\' 10"  (177.8 cm) Weight: (!) 349 lb (158.305 kg) IBW/kg (Calculated) : 73 Heparin Dosing Weight: 111 kg  Vital Signs: Temp: 97.8 F (36.6 C) (06/18 1155) Temp Source: Oral (06/18 1155) BP: 110/61 mmHg (06/18 1400) Pulse Rate: 76 (06/18 1400)  Labs:  Recent Labs  07/02/14 0800  HGB 12.7*  HCT 38.7*  PLT 349  CREATININE 0.93    Estimated Creatinine Clearance: 110.4 mL/min (by C-G formula based on Cr of 0.93).   Medical History: Past Medical History  Diagnosis Date  . Diabetes mellitus   . Hypertension   . Hyperlipidemia   . Morbid obesity     WITH HYPOVENTILATION  . OSA (obstructive sleep apnea)   . Atrial fibrillation    Assessment: Patient's a  74 y.o M currently on xarelto 20mg  daily for Afib. Chest CT performed this evening now back positive for R PE with R heart strain.  Of note, patient received xarelto 20mg  dose this morning at 1028.  To transition from xarelto to heparin for new PE.  Goal of Therapy:  Heparin level 0.3-0.7 units/ml; aPTT 66-102 secs Monitor platelets by anticoagulation protocol: Yes   Plan:  - Typical recommendation is to start heparin when the next dose of xarelto is due.  However, d/t to the fact that patient developed significant new thrombolic event while on therapeutic xarelto xarelto, will start heparin drip 12 hours after this morning's dose. - Will start heparin conservatively at 1200 units/hr (no bolus) at 11 PM - check baseline aPTT and heparin level - check 6 hour heparin level and aPTT - monitor closely for s/s bleeding  Jaynia Fendley P 07/04/2014,5:32 PM

## 2014-07-04 NOTE — Progress Notes (Addendum)
Progress Note   Hasten Sweitzer ZOX:[id number] DOB: [birth year] DOA: July 23, 2014 PCP: Eartha Inch, MD   Brief Narrative:   John Paul is an 74 y.o. male with a PMH of hypertension, atrial fibrillation on chronic Xarelto, diabetes and dyslipidemia who was admitted Jul 23, 2014 by the critical care team with hypotension, lactic acidosis, and severe diarrhea. Pt was initially admitted to ICU service where his BP was ultimately stabilized with aggressive IVF hydration. Cdiff was neg as was fecal pathogen panel. The patient's diarrhea gradually improved. During this course, the patient encountered periods of afib with RVR requiring cardizem gtt. on 07/01/14, the patient was noted to have a wheeze with increased respiratory distress and recurrent fever. Empiric vancomycin/Zosyn started and pulmonology called in consultation.  Assessment/Plan:   Principal Problem:   Hypovolemic shock/hypotension in the setting of severe diarrhea with lactic acidosis/severe dehydration rule out sepsis - Status post aggressive fluid volume resuscitation. - Empiric Flagyl started on admission and discontinued 06/29/14 after C. difficile studies negative. - C. difficile negative. Stool pathogen panel negative. - Continue Lomotil as needed. - Given ongoing lack of improvement and concerns for occult infection, will obtain CT of the chest abdomen and pelvis. - ID consultation requested.  Active Problems:   Acute respiratory distress secondary to pulmonary vascular congestion - On 07/01/14, the pt developed acute respiratory distress. CXR showed cardiomegaly and pulmonary congestion. Lasix given. - Currently on Lasix 40 mg twice a day. D/C given hypotension. - Continue Xopenex as needed.    Oral thrush - Continue nystatin.    Atrial fibrillation with RVR / permanent atrial fibrillation on chronic anticoagulation with Xarelto - On chronic Xarelto 20 mg daily and atenolol. Atenolol held on admission. - On 06/26/14,  HR increased into the 130s and EKG showed atrial flutter. Patient was started on Cardizem IV. Atenolol resumed. - Cardiology consultation requested after patient developed hypotension necessitating holding atenolol. - Per cardiologist, no plans for inpatient cardioversion. Will need follow-up post discharge.    Hyperlipidemia - Continue statin therapy.    Severe physical deconditioning - Seen by physical therapy. SNF recommended.    Diabetes mellitus type 2, noninsulin dependent - Hemoglobin A1c 6%. Oral hypoglycemics on hold. - Currently on insulin resistant SSI. CBGs 124-141.    Hyperkalemia / hyponatremia / hypomagnesemia / non-anion gap metabolic acidosis - Hyperkalemia, hyponatremia resolved with IV fluids. - Magnesium replaced.    Hypocalcemia - Given 1 g of calcium gluconate 2014/07/23.    Leukocytosis / rule out sepsis - Blood cultures done Jul 23, 2014: Negative. - Urine cultures done Jul 23, 2014: Negative. - Repeat blood cultures done 07/01/14: Negative to date.    Obstructive sleep apnea / morbid obesity / hypoalbuminemia - BMI 49.07. Continue heart healthy/carbohydrate modified diet. - Dietitian evaluation done 06/26/14. - Not on C Pap. Nocturnal BiPAP started 07/01/14, however patient refused.    Pressure ulcer - Evaluated by wound care nurse, continue silicone dressing over sacral area and elbow. - Continue bariatric mattress and bilateral pressure redistribution boots.    Normocytic anemia - Likely anemia of chronic disease. No current indication for transfusion.    DVT Prophylaxis - On chronic Xarelto.  Code Status: Full. Family Communication: Wife updated at the bedside. Disposition Plan: SNF recommended by PT. Continues to require inpatient care for further evaluation of underlying infection.   IV Access:    Peripheral IV   Procedures and diagnostic studies:   Dg Forearm Left  07/02/2014   CLINICAL DATA:  Fall last week with right wrist  and forearm pain. Pain  with supination.  EXAM: LEFT FOREARM - 2 VIEW  COMPARISON:  None.  FINDINGS: There are mild degenerative changes over the elbow and wrist as well as first carpometacarpal joint. No acute fracture or dislocation.  IMPRESSION: No acute findings.   Electronically Signed   By: Elberta Fortis M.D.   On: 07/02/2014 10:56   Dg Chest Port 1 View  07/01/2014   CLINICAL DATA:  Shortness of breath.  Weakness.  EXAM: PORTABLE CHEST - 1 VIEW  COMPARISON:  06/27/2014 and 06/23/2014 and 04/14/2005  FINDINGS: There is new pulmonary vascular congestion without pulmonary edema. Chronic cardiomegaly. Chronic tortuosity and calcification of the thoracic aorta. No acute osseous abnormality.  IMPRESSION: Pulmonary vascular congestion.  Chronic cardiomegaly.   Electronically Signed   By: Francene Boyers M.D.   On: 07/01/2014 18:03   Dg Chest Port 1 View  06/27/2014   CLINICAL DATA:  Shortness of breath with diarrhea intermittently for 2 days. History of diabetes and obesity. Initial encounter.  EXAM: PORTABLE CHEST - 1 VIEW  COMPARISON:  04/14/2005 and 06/22/2014 radiographs.  FINDINGS: 1035 hours. Stable cardiomegaly and mild chronic vascular congestion. No edema, confluent airspace opacity or pleural effusion. The bones appear unchanged. Telemetry leads overlie the chest.  IMPRESSION: Stable chest with mild cardiomegaly.  No acute findings demonstrated   Electronically Signed   By: Carey Bullocks M.D.   On: 06/27/2014 12:21   Dg Chest Port 1 View  07/02/2014   CLINICAL DATA:  Short of breath.  Diarrhea.  Cough.  EXAM: PORTABLE CHEST - 1 VIEW  COMPARISON:  04/14/2005.  FINDINGS: Chronic cardiomegaly. No airspace disease or pleural effusion. Monitoring leads project over the chest. Basilar atelectasis.  IMPRESSION: Chronic cardiomegaly without acute cardiopulmonary disease.   Electronically Signed   By: Andreas Newport M.D.   On: 06/30/2014 15:29   Dg Abd Portable 1v  06/27/2014   CLINICAL DATA:  Shortness of breath with  intermittent diarrhea for 2 days. History of hypertension and obesity. Initial encounter.  EXAM: PORTABLE ABDOMEN - 1 VIEW  COMPARISON:  None.  FINDINGS: Examination is limited by body habitus and breathing artifact. The visualized bowel gas pattern is nonobstructive. There is no supine evidence of free intraperitoneal air or bowel wall thickening. There are no suspicious abdominal calcifications. Degenerative changes are present throughout the visualized spine.  IMPRESSION: No acute findings or explanation for the patient's symptoms. Examination is limited by body habitus and breathing artifact.   Electronically Signed   By: Carey Bullocks M.D.   On: 06/27/2014 12:23     Medical Consultants:    None.  Anti-Infectives:    Vancomycin 07/05/2014---> 06/26/14, restarted 07/01/14---> 07/03/14  Zosyn 06/26/2014---> 06/26/14, restarted 07/01/14---> 07/03/14  Flagyl 06/26/2014---> 06/29/14  Subjective:   John Paul is lethargic.  His wife reports that his appetite has been poor.  He has not had significant diarrhea, but has not been eating much.  She reports that he had oral thrush and a yeast infection in his pannus/groin folds, but these seem to have cleared.  Objective:    Filed Vitals:   07/03/14 2335 07/04/14 0200 07/04/14 0400 07/04/14 0600  BP:  113/45 127/65 120/71  Pulse:  112 103 68  Temp: 100.4 F (38 C)  99.9 F (37.7 C)   TempSrc: Axillary  Axillary   Resp:  Height:      Weight:   158.305 kg (349 lb)   SpO2:  92% 94% 94%  Intake/Output Summary (Last 24 hours) at 07/04/14 0804 Last data filed at 07/04/14 0600  Gross per 24 hour  Intake   1200 ml  Output   1525 ml  Net   -325 ml    Exam: Gen:  NAD, lethargic Cardiovascular:  HSIR, tachy Respiratory:  Lungs diminished Gastrointestinal:  Abdomen soft, obese, NT, +BS Extremities:  3+ edema with venous stasis changes Skin: Some erythema to intertriginous areas, no scrotal swelling, small abrasion left  testicle   Data Reviewed:    Labs: Basic Metabolic Panel:  Recent Labs Lab 06/28/14 0414 06/29/14 0348 07/01/14 0335 07/01/14 1000 07/02/14 0800  NA 137 135 135  --  138  K 3.5 3.8 4.3  --  3.9  CL 107 107 106  --  103  CO2 21* 22 22  --  24  GLUCOSE 139* 155* 143*  --  142*  BUN 17 17 15   --  17  CREATININE 0.95 1.04 0.88  --  0.93  CALCIUM 7.4* 7.3* 7.3*  --  7.4*  MG 1.4* 1.5* 1.5* 1.4* 1.4*   GFR Estimated Creatinine Clearance: 110.4 mL/min (by C-G formula based on Cr of 0.93). Liver Function Tests:  Recent Labs Lab 07/02/14 0800  AST 17  ALT 11*  ALKPHOS 70  BILITOT 0.4  PROT 5.2*  ALBUMIN 1.9*   CBC:  Recent Labs Lab 06/28/14 0414 06/29/14 0348 07/01/14 1000 07/02/14 0800  WBC 16.4* 17.2* 14.7* 12.5*  HGB 12.5* 12.3* 11.8* 12.7*  HCT 37.9* 37.9* 36.2* 38.7*  MCV 95.7 95.7 94.3 94.9  PLT 327 333 327 349   CBG:  Recent Labs Lab 07/02/14 1654 07/02/14 2151 07/03/14 1147 07/03/14 1603 07/03/14 2336  GLUCAP 133* 124* 144* 141* 140*   Sepsis Labs:  Recent Labs Lab 06/28/14 0414 06/29/14 0348 07/01/14 1000 07/01/14 1737 07/01/14 1805 07/01/14 1909 07/02/14 0800  PROCALCITON  --  0.72  --  0.37  --   --  0.31  WBC 16.4* 17.2* 14.7*  --   --   --  12.5*  LATICACIDVEN  --   --   --   --  1.6 1.6  --    Microbiology Recent Results (from the past 240 hour(s))  Blood culture (routine x 2)     Status: None   Collection Time: 2014-07-17  3:28 PM  Result Value Ref Range Status   Specimen Description BLOOD RIGHT ARM  Final   Special Requests BOTTLES DRAWN AEROBIC AND ANAEROBIC 5CC EACH  Final   Culture   Final    NO GROWTH 5 DAYS Performed at [facility]    Report Status 07/01/2014 FINAL  Final  Blood culture (routine x 2)     Status: None   Collection Time: 17-Jul-2014  3:29 PM  Result Value Ref Range Status   Specimen Description BLOOD LEFT ARM  Final   Special Requests BOTTLES DRAWN AEROBIC AND ANAEROBIC 5CC EACH  Final    Culture   Final    NO GROWTH 5 DAYS Performed at [facility]    Report Status 07/01/2014 FINAL  Final  Urine culture     Status: None   Collection Time: 07/17/2014  5:46 PM  Result Value Ref Range Status   Specimen Description Urine  Final   Special Requests Normal  Final   Colony Count NO GROWTH Performed at [facility]   Final   Culture NO GROWTH Performed at [facility]   Final   Report Status  06/26/2014 FINAL  Final  MRSA PCR Screening     Status: None   Collection Time: 2014/07/02 10:01 PM  Result Value Ref Range Status   MRSA by PCR NEGATIVE NEGATIVE Final    Comment:        The GeneXpert MRSA Assay (FDA approved for NASAL specimens only), is one component of a comprehensive MRSA colonization surveillance program. It is not intended to diagnose MRSA infection nor to guide or monitor treatment for MRSA infections.   Culture, Urine     Status: None   Collection Time: 06/26/14  1:45 AM  Result Value Ref Range Status   Specimen Description URINE, CATHETERIZED  Final   Special Requests NONE  Final   Colony Count NO GROWTH Performed at [facility]   Final   Culture NO GROWTH Performed at [facility]   Final   Report Status 06/27/2014 FINAL  Final  Culture, Urine     Status: None   Collection Time: 06/27/14 11:51 AM  Result Value Ref Range Status   Specimen Description URINE, CATHETERIZED  Final   Special Requests NONE  Final   Colony Count NO GROWTH Performed at [facility]   Final   Culture NO GROWTH Performed at [facility]   Final   Report Status 06/28/2014 FINAL  Final  Culture, blood (routine x 2)     Status: None   Collection Time: 06/27/14 12:47 PM  Result Value Ref Range Status   Specimen Description BLOOD LEFT ARM  Final   Special Requests BOTTLES DRAWN AEROBIC ONLY 10CC AEROBIC ONLY  Final   Culture   Final    NO GROWTH 5 DAYS Performed at [facility]    Report Status  07/03/2014 FINAL  Final  Culture, blood (routine x 2)     Status: None   Collection Time: 06/27/14 12:54 PM  Result Value Ref Range Status   Specimen Description BLOOD LEFT ARM  Final   Special Requests   Final    BOTTLES DRAWN AEROBIC AND ANAEROBIC 10CC BOTH BOTTLES   Culture   Final    NO GROWTH 5 DAYS Performed at [facility]    Report Status 07/03/2014 FINAL  Final  Clostridium Difficile by PCR (not at [facility])     Status: None   Collection Time: 06/27/14  6:00 PM  Result Value Ref Range Status   C difficile by pcr NEGATIVE NEGATIVE Final  Culture, blood (x 2)     Status: None (Preliminary result)   Collection Time: 07/01/14  7:05 PM  Result Value Ref Range Status   Specimen Description BLOOD LEFT HAND  Final   Special Requests BOTTLES DRAWN AEROBIC ONLY 6CC  Final   Culture   Final    NO GROWTH 1 DAY Performed at [facility]    Report Status PENDING  Incomplete  Culture, blood (x 2)     Status: None (Preliminary result)   Collection Time: 07/01/14  7:20 PM  Result Value Ref Range Status   Specimen Description BLOOD LEFT ARM  Final   Special Requests BOTTLES DRAWN AEROBIC AND ANAEROBIC 10CC  Final   Culture   Final    NO GROWTH 1 DAY Performed at [facility]    Report Status PENDING  Incomplete     Medications:   . antiseptic oral rinse  7 mL Mouth Rinse q12n4p  . atenolol  100 mg Oral Daily  . atorvastatin  10 mg Oral QHS  .  chlorhexidine  15 mL Mouth Rinse BID  . furosemide  40 mg Intravenous BID  . gabapentin  100 mg Oral BID  . insulin aspart  0-20 Units Subcutaneous TID WC  . insulin aspart  0-5 Units Subcutaneous QHS  . nystatin  5 mL Oral QID  . nystatin   Topical TID  . rivaroxaban  20 mg Oral Daily   Continuous Infusions: . sodium chloride Stopped (06/29/14 0806)  . sodium chloride 50 mL/hr at 07/03/14 1700  . diltiazem (CARDIZEM) infusion Stopped (06/29/14 0930)    Time spent: 35 minutes.  The patient is medically complex  and requires high complexity decision making.    LOS: 9 days   Bilal Manzer  [facility] Pager [phone]. If unable to reach me by pager, please call my cell phone at [phone].  *Please refer to [email or web address], password [email or web address] to get updated schedule on who will round on this patient, as hospitalists switch teams weekly. If 7PM-7AM, please contact night-coverage at [email or web address], password [email or web address] for any overnight needs.  07/04/2014, 8:04 AM

## 2014-07-04 NOTE — Consult Note (Signed)
[name]    Date of Admission:  06/24/2014  Date of Consult:  07/04/2014  Reason for Consult: FUO Referring Physician: Dr. Rockne Menghini   HPI: John Paul is an 74 y.o. male with Diabetes mellitus with neuropathy, morbid obesity, OSA, atrial fibrillation who was admitted with profuse diarrhea and fevers and treated with broad spectrum antibiotics though no clear cause for his fevers could be found. Diarrhea resolved.  Antibiotics were stopped on the 15th but then he became febrile again and WBC up and he was restarted on yet again vancomycin and zosyn, again. Dr Rama consulted me today regarding FUO and I had advised CT abdomen and pelvis. CT now shows evidence for colitis and a large PE.  On my exam today I found the patient to have significant muscle tenderness to palpation in proximal muscles of arms, legs, as well as in wrist and knee joints.  He tells me he has barely been able to move for past 10 days. He recounts a fall when his wife fell into him and he vs the floor that preceded his current illness   Past Medical History  Diagnosis Date  . Diabetes mellitus   . Hypertension   . Hyperlipidemia   . Morbid obesity     WITH HYPOVENTILATION  . OSA (obstructive sleep apnea)   . Atrial fibrillation     Past Surgical History  Procedure Laterality Date  . Vasectomy    . US echocardiography  02/12/2009    EF 55-60%  ergies:   No Known Allergies   Medications: I have reviewed patients current medications as documented in Epic Anti-infectives    Start     Dose/Rate Route Frequency Ordered Stop   07/02/14 0600  vancomycin (VANCOCIN) 1,250 mg in sodium chloride 0.9 % 250 mL IVPB  Status:  Discontinued     1,250 mg 166.7 mL/hr over 90 Minutes Intravenous Every 12 hours 07/01/14 1803 07/03/14 1323   07/02/14 0200  piperacillin-tazobactam (ZOSYN) IVPB 3.375 g  Status:  Discontinued     3.375 g 12.5 mL/hr over 240 Minutes Intravenous Every 8  hours 07/01/14 1804 07/03/14 1323   07/01/14 1830  vancomycin (VANCOCIN) 2,000 mg in sodium chloride 0.9 % 500 mL IVPB     2,000 mg 250 mL/hr over 120 Minutes Intravenous  Once 07/01/14 1736 07/01/14 2201   07/01/14 1800  piperacillin-tazobactam (ZOSYN) IVPB 3.375 g     3.375 g 100 mL/hr over 30 Minutes Intravenous  Once 07/01/14 1736 07/01/14 2032   06/26/14 1800  vancomycin (VANCOCIN) 1,750 mg in sodium chloride 0.9 % 500 mL IVPB  Status:  Discontinued     1,750 mg 250 mL/hr over 120 Minutes Intravenous Every 24 hours 06/21/2014 2025 06/26/14 1726   06/26/14 0400  metroNIDAZOLE (FLAGYL) IVPB 500 mg  Status:  Discontinued     500 mg 100 mL/hr over 60 Minutes Intravenous Every 8 hours 06/21/2014 2025 06/29/14 0816   06/26/14 0200  piperacillin-tazobactam (ZOSYN) IVPB 3.375 g  Status:  Discontinued     3.375 g 12.5 mL/hr over 240 Minutes Intravenous Every 8 hours 06/22/2014 2025 06/26/14 1726   07/15/2014 1930  metroNIDAZOLE (FLAGYL) IVPB 500 mg     500 mg 100 mL/hr over 60 Minutes Intravenous  Once 06/19/2014 1919 07/06/2014 2023   07/03/2014 1930  vancomycin (VANCOCIN) IVPB 1000 mg/200 mL premix  Status:  Discontinued     1,000 mg 200 mL/hr over 60 Minutes Intravenous  Once 07/12/2014 1919 06/18/2014 2030   07/05/2014 1745  metroNIDAZOLE (FLAGYL) tablet 500 mg     500 mg Oral  Once 06/21/2014 1740 06/24/2014 1816   07/08/2014 1745  piperacillin-tazobactam (ZOSYN) IVPB 3.375 g     3.375 g 12.5 mL/hr over 240 Minutes Intravenous  Once 06/21/2014 1740 06/23/2014 2215   07/04/2014 1745  vancomycin (VANCOCIN) IVPB 1000 mg/200 mL premix     1,000 mg 200 mL/hr over 60 Minutes Intravenous  Once 07/15/2014 1740 07/09/2014 1915      Social History:  reports that he quit smoking about 43 years ago. He does not have any smokeless tobacco history on file. He reports that he does not drink alcohol or use illicit drugs.  Family History  Problem Relation Age of Onset  . Clotting disorder Father     As in HPI and primary teams  notes otherwise 12 point review of systems is negative  Blood pressure 108/59, pulse 122, temperature 97.8 F (36.6 C), temperature source Oral, resp. rate 28, height $RemoveBe'5\' 10"'bBOCadHyJ$  (1.778 m), weight 349 lb (158.305 kg), SpO2 93 %. General: Alert and awake, oriented x3, tired and frequently falling asleep HEENT: anicteric sclera, pupils reactive to light and accommodation, EOMI, oropharynx clear and without exudate CVS dsitant heart sounds regular rate, normal r,  no murmur rubs or gallops Chest: clear to auscultation bilaterally, no wheezing, rales or rhonchi Abdomen: soft nontender, nondistended, normal bowel sounds, Extremities: he has tenderness to palpation of upper arms wrist joints, thighs and knees but no clear cut effusions Skin: intertrigo in groin and venous stasis changes in legs  07/04/14:         Neuro: nonfocal, strength and sensation intact   Results for orders placed or performed during the hospital encounter of 07/11/2014 (from the past 48 hour(s))  Glucose, capillary     Status: Abnormal   Collection Time: 07/02/14  9:51 PM  Result Value Ref Range   Glucose-Capillary 124 (H) 65 - 99 mg/dL  Glucose, capillary     Status: Abnormal   Collection Time: 07/03/14  8:52 AM  Result Value Ref Range   Glucose-Capillary 148 (H) 65 - 99 mg/dL  Glucose, capillary     Status: Abnormal   Collection Time: 07/03/14 11:47 AM  Result Value Ref Range   Glucose-Capillary 144 (H) 65 - 99 mg/dL  Glucose, capillary     Status: Abnormal   Collection Time: 07/03/14  4:03 PM  Result Value Ref Range   Glucose-Capillary 141 (H) 65 - 99 mg/dL  Glucose, capillary     Status: Abnormal   Collection Time: 07/03/14 11:36 PM  Result Value Ref Range   Glucose-Capillary 140 (H) 65 - 99 mg/dL  Glucose, capillary     Status: Abnormal   Collection Time: 07/04/14 11:54 AM  Result Value Ref Range   Glucose-Capillary 129 (H) 65 - 99 mg/dL   '@BRIEFLABTABLE'$ (sdes,specrequest,cult,reptstatus)   ) Recent  Results (from the past 720 hour(s))  Blood culture (routine x 2)     Status: None   Collection Time: 06/27/2014  3:28 PM  Result Value Ref Range Status   Specimen Description BLOOD RIGHT ARM  Final   Special Requests BOTTLES DRAWN AEROBIC AND ANAEROBIC 5CC EACH  Final   Culture   Final    NO GROWTH 5 DAYS Performed at [facility]    Report Status 07/01/2014 FINAL  Final  Blood culture (routine x 2)     Status: None   Collection Time: 07/14/2014  3:29 PM  Result Value Ref Range Status   Specimen Description BLOOD LEFT ARM  Final   Special Requests BOTTLES DRAWN AEROBIC AND ANAEROBIC 5CC EACH  Final   Culture   Final    NO GROWTH 5 DAYS Performed at [facility]    Report Status 07/01/2014 FINAL  Final  Urine culture     Status: None   Collection Time: 06/19/2014  5:46 PM  Result Value Ref Range Status   Specimen Description Urine  Final   Special Requests Normal  Final   Colony Count NO GROWTH Performed at [facility]   Final   Culture NO GROWTH Performed at [facility]   Final   Report Status 06/26/2014 FINAL  Final  MRSA PCR Screening     Status: None   Collection Time: 06/18/2014 10:01 PM  Result Value Ref Range Status   MRSA by PCR NEGATIVE NEGATIVE Final    Comment:        The GeneXpert MRSA Assay (FDA approved for NASAL specimens only), is one component of a comprehensive MRSA colonization surveillance program. It is not intended to diagnose MRSA infection nor to guide or monitor treatment for MRSA infections.   Culture, Urine     Status: None   Collection Time: 06/26/14  1:45 AM  Result Value Ref Range Status   Specimen Description URINE, CATHETERIZED  Final   Special Requests NONE  Final   Colony Count NO GROWTH Performed at [facility]   Final   Culture NO GROWTH Performed at [facility]   Final   Report Status 06/27/2014 FINAL  Final  Culture, Urine     Status: None   Collection Time: 06/27/14 11:51 AM    Result Value Ref Range Status   Specimen Description URINE, CATHETERIZED  Final   Special Requests NONE  Final   Colony Count NO GROWTH Performed at [facility]   Final   Culture NO GROWTH Performed at [facility]   Final   Report Status 06/28/2014 FINAL  Final  Culture, blood (routine x 2)     Status: None   Collection Time: 06/27/14 12:47 PM  Result Value Ref Range Status   Specimen Description BLOOD LEFT ARM  Final   Special Requests BOTTLES DRAWN AEROBIC ONLY 10CC AEROBIC ONLY  Final   Culture   Final    NO GROWTH 5 DAYS Performed at [facility]    Report Status 07/03/2014 FINAL  Final  Culture, blood (routine x 2)     Status: None   Collection Time: 06/27/14 12:54 PM  Result Value Ref Range Status   Specimen Description BLOOD LEFT ARM  Final   Special Requests   Final    BOTTLES DRAWN AEROBIC AND ANAEROBIC 10CC BOTH BOTTLES   Culture   Final    NO GROWTH 5 DAYS Performed at [facility]    Report Status 07/03/2014 FINAL  Final  Clostridium Difficile by PCR (not at [facility])     Status: None   Collection Time: 06/27/14  6:00 PM  Result Value Ref Range Status   C difficile by pcr NEGATIVE NEGATIVE Final  Culture, blood (x 2)     Status: None (Preliminary result)   Collection Time: 07/01/14  7:05 PM  Result Value Ref Range Status   Specimen Description BLOOD LEFT HAND  Final   Special Requests BOTTLES DRAWN AEROBIC ONLY [facility]  Final   Culture   Final  NO GROWTH 2 DAYS Performed at [facility]    Report Status PENDING  Incomplete  Culture, blood (x 2)     Status: None (Preliminary result)   Collection Time: 07/01/14  7:20 PM  Result Value Ref Range Status   Specimen Description BLOOD LEFT ARM  Final   Special Requests BOTTLES DRAWN AEROBIC AND ANAEROBIC 10CC  Final   Culture   Final    NO GROWTH 2 DAYS Performed at [facility]    Report Status PENDING  Incomplete     Impression/Recommendation  Principal  Problem:   Hypovolemic shock in the setting of severe diarrhea Active Problems:   Atrial fibrillation with RVR   Morbid obesity   Hyperlipidemia   Diabetes mellitus type 2, noninsulin dependent   Hypotension   Acute kidney injury   Diarrhea   Dehydration, severe   Hyperkalemia   Hyponatremia   Lactic acidosis   Chronic anticoagulation-Xarelto   Permanent atrial fibrillation   Leukocytosis   Pulmonary vascular congestion   Sepsis   Pressure ulcer   Hypoalbuminemia   Normocytic anemia   John Paul is a 74 y.o. male with  Morbid obesity, AF w RVR, DM with neuropathy, likely PVD admitted with diarrhea fevers, sp rx with abx (though without a clear cut target) resolution of diarrhea then recurrence of fevers but without clear cut cause again now found to have evidence of colitis on CT scan, large PE. He also has multiple joints that are exquisitely tender to palpation as well as muscles  #1 FUO:  CT scan has shown two potential causes, PE and colitis ( I would suspect that IBD is strong possibility)   I will send further FUO labs including Rheum labs such as RF, ANA, anti DS antibody, anti DNA, SSA/SSB, labs for IBD, ESR, CRP, ferritin, LDH, SPEP, HIV, hepatits panel QF gold, CMV and EBV serologies  He will likely need GI evaluation to pin down diagnosis cause of his diarrhea and changes on CT scan though at present on full anticoagulation not a good time  Agree with full anticoagulation  No need for steroids  He may need empiric course of steroids for Autoimmune disease including possible CD, UC  I would formally consult GI and consider phone call to local Rheumatologist as labs come back  I spent greater than 60 minutes with the patient including greater than 50% of time in face to face counsel of the patient re causes of FUO and in coordination of their care with primary team.       07/04/2014, 6:35 PM   Thank you so much for this interesting consult  [place] [facility] [phone] (pager) [phone] (office) 07/04/2014, 6:35 PM  [place] 07/04/2014, 6:35 PM

## 2014-07-05 ENCOUNTER — Inpatient Hospital Stay (HOSPITAL_COMMUNITY): Payer: Medicare HMO

## 2014-07-05 ENCOUNTER — Encounter (HOSPITAL_COMMUNITY): Payer: Self-pay | Admitting: Internal Medicine

## 2014-07-05 DIAGNOSIS — M25531 Pain in right wrist: Secondary | ICD-10-CM

## 2014-07-05 DIAGNOSIS — I2699 Other pulmonary embolism without acute cor pulmonale: Secondary | ICD-10-CM

## 2014-07-05 DIAGNOSIS — M79651 Pain in right thigh: Secondary | ICD-10-CM

## 2014-07-05 DIAGNOSIS — M25562 Pain in left knee: Secondary | ICD-10-CM

## 2014-07-05 DIAGNOSIS — M25561 Pain in right knee: Secondary | ICD-10-CM

## 2014-07-05 DIAGNOSIS — M79652 Pain in left thigh: Secondary | ICD-10-CM

## 2014-07-05 DIAGNOSIS — M25532 Pain in left wrist: Secondary | ICD-10-CM

## 2014-07-05 DIAGNOSIS — M79602 Pain in left arm: Secondary | ICD-10-CM

## 2014-07-05 DIAGNOSIS — M79601 Pain in right arm: Secondary | ICD-10-CM

## 2014-07-05 HISTORY — DX: Other pulmonary embolism without acute cor pulmonale: I26.99

## 2014-07-05 LAB — COMPREHENSIVE METABOLIC PANEL
ALK PHOS: 64 U/L (ref 38–126)
ALT: 13 U/L — ABNORMAL LOW (ref 17–63)
AST: 27 U/L (ref 15–41)
Albumin: 1.8 g/dL — ABNORMAL LOW (ref 3.5–5.0)
Anion gap: 7 (ref 5–15)
BUN: 17 mg/dL (ref 6–20)
CO2: 28 mmol/L (ref 22–32)
Calcium: 7.1 mg/dL — ABNORMAL LOW (ref 8.9–10.3)
Chloride: 101 mmol/L (ref 101–111)
Creatinine, Ser: 0.9 mg/dL (ref 0.61–1.24)
Glucose, Bld: 125 mg/dL — ABNORMAL HIGH (ref 65–99)
POTASSIUM: 3.2 mmol/L — AB (ref 3.5–5.1)
Sodium: 136 mmol/L (ref 135–145)
Total Bilirubin: 0.5 mg/dL (ref 0.3–1.2)
Total Protein: 5.3 g/dL — ABNORMAL LOW (ref 6.5–8.1)

## 2014-07-05 LAB — CBC WITH DIFFERENTIAL/PLATELET
Basophils Absolute: 0 10*3/uL (ref 0.0–0.1)
Basophils Relative: 0 % (ref 0–1)
EOS ABS: 0.1 10*3/uL (ref 0.0–0.7)
Eosinophils Relative: 2 % (ref 0–5)
HCT: 39 % (ref 39.0–52.0)
Hemoglobin: 12.5 g/dL — ABNORMAL LOW (ref 13.0–17.0)
Lymphocytes Relative: 13 % (ref 12–46)
Lymphs Abs: 1 10*3/uL (ref 0.7–4.0)
MCH: 30.6 pg (ref 26.0–34.0)
MCHC: 32.1 g/dL (ref 30.0–36.0)
MCV: 95.6 fL (ref 78.0–100.0)
MONO ABS: 1.3 10*3/uL — AB (ref 0.1–1.0)
MONOS PCT: 17 % — AB (ref 3–12)
Neutro Abs: 5.2 10*3/uL (ref 1.7–7.7)
Neutrophils Relative %: 68 % (ref 43–77)
Platelets: 333 10*3/uL (ref 150–400)
RBC: 4.08 MIL/uL — ABNORMAL LOW (ref 4.22–5.81)
RDW: 13.8 % (ref 11.5–15.5)
WBC: 7.6 10*3/uL (ref 4.0–10.5)

## 2014-07-05 LAB — GLUCOSE, CAPILLARY
Glucose-Capillary: 119 mg/dL — ABNORMAL HIGH (ref 65–99)
Glucose-Capillary: 98 mg/dL (ref 65–99)
Glucose-Capillary: 124 mg/dL — ABNORMAL HIGH (ref 65–99)
Glucose-Capillary: 155 mg/dL — ABNORMAL HIGH (ref 65–99)
Glucose-Capillary: 127 mg/dL — ABNORMAL HIGH (ref 65–99)
Glucose-Capillary: 118 mg/dL — ABNORMAL HIGH (ref 65–99)

## 2014-07-05 LAB — HEPARIN LEVEL (UNFRACTIONATED)
Heparin Unfractionated: >2.2 IU/mL — ABNORMAL HIGH (ref 0.30–0.70)
Heparin Unfractionated: >2.2 IU/mL — ABNORMAL HIGH (ref 0.30–0.70)

## 2014-07-05 LAB — CMV IGM

## 2014-07-05 LAB — APTT
aPTT: 46 seconds — ABNORMAL HIGH (ref 24–37)
aPTT: 89 seconds — ABNORMAL HIGH (ref 24–37)

## 2014-07-05 LAB — EPSTEIN-BARR VIRUS VCA ANTIBODY PANEL
EBV Early Antigen Ab, IgG: <9 U/mL (ref 0.0–8.9)
EBV NA IgG: 18.7 U/mL — ABNORMAL HIGH (ref 0.0–17.9)
EBV VCA IgG: >600 U/mL — ABNORMAL HIGH (ref 0.0–17.9)
EBV VCA IgM: <36 U/mL (ref 0.0–35.9)

## 2014-07-05 LAB — C-REACTIVE PROTEIN: CRP: 14.9 mg/dL — AB (ref <1.0)

## 2014-07-05 LAB — HIV ANTIBODY (ROUTINE TESTING W REFLEX): HIV Screen 4th Generation wRfx: NONREACTIVE

## 2014-07-05 LAB — MAGNESIUM: Magnesium: 1.3 mg/dL — ABNORMAL LOW (ref 1.7–2.4)

## 2014-07-05 LAB — PREALBUMIN: Prealbumin: 3.7 mg/dL — ABNORMAL LOW (ref 18–38)

## 2014-07-05 LAB — CK: Total CK: 20 U/L — ABNORMAL LOW (ref 49–397)

## 2014-07-05 LAB — SEDIMENTATION RATE: Sed Rate: 130 mm/hr — ABNORMAL HIGH (ref 0–16)

## 2014-07-05 LAB — CMV ANTIBODY, IGG (EIA): CMV AB - IGG: 4.4 U/mL — AB (ref 0.00–0.59)

## 2014-07-05 LAB — FERRITIN: Ferritin: 239 ng/mL (ref 24–336)

## 2014-07-05 LAB — LACTATE DEHYDROGENASE: LDH: 130 U/L (ref 98–192)

## 2014-07-05 MED ORDER — HEPARIN (PORCINE) IN NACL 100-0.45 UNIT/ML-% IJ SOLN
1750.0000 [IU]/h | INTRAMUSCULAR | Status: DC
  Administered 2014-07-05 – 2014-07-10 (×8): 1750 [IU]/h via INTRAVENOUS
  Filled 2014-07-05 (×15): qty 250

## 2014-07-05 MED ORDER — MAGNESIUM SULFATE 4 GM/100ML IV SOLN
4.0000 g | Freq: Once | INTRAVENOUS | Status: AC
Start: 2014-07-05 — End: 2014-07-05
  Administered 2014-07-05: 4 g via INTRAVENOUS
  Filled 2014-07-05: qty 100

## 2014-07-05 MED ORDER — HALOPERIDOL LACTATE 5 MG/ML IJ SOLN
2.0000 mg | Freq: Once | INTRAMUSCULAR | Status: AC
  Administered 2014-07-05: 2 mg via INTRAVENOUS
  Filled 2014-07-05: qty 1

## 2014-07-05 MED ORDER — POTASSIUM CHLORIDE CRYS ER 20 MEQ PO TBCR
40.0000 meq | EXTENDED_RELEASE_TABLET | Freq: Once | ORAL | Status: AC
  Administered 2014-07-05: 40 meq via ORAL
  Filled 2014-07-05: qty 2

## 2014-07-05 MED ORDER — PRO-STAT 64 PO LIQD
30.0000 mL | Freq: Three times a day (TID) | ORAL | Status: DC
  Administered 2014-07-05 – 2014-07-06 (×2): 30 mL via ORAL
  Filled 2014-07-05 (×6): qty 30

## 2014-07-05 NOTE — Progress Notes (Signed)
[facility]       Subjective: No new complaints   Antibiotics:  Anti-infectives    Start     Dose/Rate Route Frequency Ordered Stop   07/02/14 0600  vancomycin (VANCOCIN) 1,250 mg in sodium chloride 0.9 % 250 mL IVPB  Status:  Discontinued     1,250 mg 166.7 mL/hr over 90 Minutes Intravenous Every 12 hours 07/01/14 1803 07/03/14 1323   07/02/14 0200  piperacillin-tazobactam (ZOSYN) IVPB 3.375 g  Status:  Discontinued     3.375 g 12.5 mL/hr over 240 Minutes Intravenous Every 8 hours 07/01/14 1804 07/03/14 1323   07/01/14 1830  vancomycin (VANCOCIN) 2,000 mg in sodium chloride 0.9 % 500 mL IVPB     2,000 mg 250 mL/hr over 120 Minutes Intravenous  Once 07/01/14 1736 07/01/14 2201   07/01/14 1800  piperacillin-tazobactam (ZOSYN) IVPB 3.375 g     3.375 g 100 mL/hr over 30 Minutes Intravenous  Once 07/01/14 1736 07/01/14 2032   06/26/14 1800  vancomycin (VANCOCIN) 1,750 mg in sodium chloride 0.9 % 500 mL IVPB  Status:  Discontinued     1,750 mg 250 mL/hr over 120 Minutes Intravenous Every 24 hours 07/12/2014 2025 06/26/14 1726   06/26/14 0400  metroNIDAZOLE (FLAGYL) IVPB 500 mg  Status:  Discontinued     500 mg 100 mL/hr over 60 Minutes Intravenous Every 8 hours 07/15/2014 2025 06/29/14 0816   06/26/14 0200  piperacillin-tazobactam (ZOSYN) IVPB 3.375 g  Status:  Discontinued     3.375 g 12.5 mL/hr over 240 Minutes Intravenous Every 8 hours 07/06/2014 2025 06/26/14 1726   06/30/2014 1930  metroNIDAZOLE (FLAGYL) IVPB 500 mg     500 mg 100 mL/hr over 60 Minutes Intravenous  Once 06/18/2014 1919 06/28/2014 2023   07/03/2014 1930  vancomycin (VANCOCIN) IVPB 1000 mg/200 mL premix  Status:  Discontinued     1,000 mg 200 mL/hr over 60 Minutes Intravenous  Once 06/20/2014 1919 07/15/2014 2030   07/14/2014 1745  metroNIDAZOLE (FLAGYL) tablet 500 mg     500 mg Oral  Once 07/01/2014 1740 07/14/2014 1816   06/20/2014 1745  piperacillin-tazobactam (ZOSYN) IVPB 3.375 g     3.375 g 12.5  mL/hr over 240 Minutes Intravenous  Once 06/19/2014 1740 07/07/2014 2215   07/05/2014 1745  vancomycin (VANCOCIN) IVPB 1000 mg/200 mL premix     1,000 mg 200 mL/hr over 60 Minutes Intravenous  Once 06/26/2014 1740 06/26/2014 1915      Medications: Scheduled Meds: . antiseptic oral rinse  7 mL Mouth Rinse q12n4p  . atenolol  100 mg Oral Daily  . atorvastatin  10 mg Oral QHS  . chlorhexidine  15 mL Mouth Rinse BID  . feeding supplement (PRO-STAT 64)  30 mL Oral TID WC  . gabapentin  100 mg Oral BID  . insulin aspart  0-20 Units Subcutaneous TID WC  . insulin aspart  0-5 Units Subcutaneous QHS  . nystatin  5 mL Oral QID  . nystatin   Topical TID   Continuous Infusions: . sodium chloride Stopped (06/29/14 0806)  . sodium chloride 1,000 mL (07/04/14 2305)  . diltiazem (CARDIZEM) infusion Stopped (06/29/14 0930)  . heparin 1,750 Units/hr (07/05/14 0658)   PRN Meds:.sodium chloride, acetaminophen, diphenoxylate-atropine, feeding supplement (GLUCERNA SHAKE), HYDROcodone-acetaminophen, levalbuterol    Objective: Weight change: 6 lb (2.722 kg)  Intake/Output Summary (Last 24 hours) at 07/05/14 1518 Last data filed at 07/05/14 1100  Gross per 24 hour  Intake 1555.38 ml  Output  1000 ml  Net 555.38 ml   Blood pressure 118/56, pulse 97, temperature 98.2 F (36.8 C), temperature source Oral, resp. rate 23, height $RemoveBe'5\' 10"'leoojcNWl$  (1.778 m), weight 355 lb (161.027 kg), SpO2 96 %. Temp:  [98.2 F (36.8 C)-99.1 F (37.3 C)] 98.2 F (36.8 C) (06/19 0800) Pulse Rate:  [52-142] 97 (06/19 0800) Resp:  [23-35] 23 (06/19 0800) BP: (86-118)/(44-62) 118/56 mmHg (06/19 0800) SpO2:  [89 %-100 %] 96 % (06/19 0800) Weight:  [355 lb (161.027 kg)] 355 lb (161.027 kg) (06/19 0400)  Physical Exam: General: Alert and awake, oriented x3, tired and frequently falling asleep HEENT: anicteric sclera, pupils reactive to light and accommodation, EOMI, oropharynx clear and without exudate CVS dsitant heart sounds regular  rate, normal r, no murmur rubs or gallops Chest: clear to auscultation bilaterally, no wheezing, rales or rhonchi Abdomen: soft nontender, nondistended, normal bowel sounds, Extremities: he has tenderness to palpation of upper arms wrist joints, thighs and knees but no clear cut effusions Skin: intertrigo in groin and venous stasis changes in legs  CBC: CBC Latest Ref Rng 07/05/2014 07/02/2014 07/01/2014  WBC 4.0 - 10.5 K/uL 7.6 12.5(H) 14.7(H)  Hemoglobin 13.0 - 17.0 g/dL 12.5(L) 12.7(L) 11.8(L)  Hematocrit 39.0 - 52.0 % 39.0 38.7(L) 36.2(L)  Platelets 150 - 400 K/uL 333 349 327       BMET  Recent Labs  07/05/14 0018  NA 136  K 3.2*  CL 101  CO2 28  GLUCOSE 125*  BUN 17  CREATININE 0.90  CALCIUM 7.1*     Liver Panel   Recent Labs  07/05/14 0018  PROT 5.3*  ALBUMIN 1.8*  AST 27  ALT 13*  ALKPHOS 64  BILITOT 0.5       Sedimentation Rate  Recent Labs  07/05/14 0500  ESRSEDRATE 130*   C-Reactive Protein  Recent Labs  07/05/14 0500  CRP 14.9*    Micro Results: Recent Results (from the past 720 hour(s))  Blood culture (routine x 2)     Status: None   Collection Time: 06/23/2014  3:28 PM  Result Value Ref Range Status   Specimen Description BLOOD RIGHT ARM  Final   Special Requests BOTTLES DRAWN AEROBIC AND ANAEROBIC 5CC EACH  Final   Culture   Final    NO GROWTH 5 DAYS Performed at [facility]    Report Status 07/01/2014 FINAL  Final  Blood culture (routine x 2)     Status: None   Collection Time: 07/12/2014  3:29 PM  Result Value Ref Range Status   Specimen Description BLOOD LEFT ARM  Final   Special Requests BOTTLES DRAWN AEROBIC AND ANAEROBIC 5CC EACH  Final   Culture   Final    NO GROWTH 5 DAYS Performed at [facility]    Report Status 07/01/2014 FINAL  Final  Urine culture     Status: None   Collection Time: 06/22/2014  5:46 PM  Result Value Ref Range Status   Specimen Description Urine  Final   Special Requests  Normal  Final   Colony Count NO GROWTH Performed at [facility]   Final   Culture NO GROWTH Performed at [facility]   Final   Report Status 06/26/2014 FINAL  Final  MRSA PCR Screening     Status: None   Collection Time: 07/04/2014 10:01 PM  Result Value Ref Range Status   MRSA by PCR NEGATIVE NEGATIVE Final    Comment:  The GeneXpert MRSA Assay (FDA approved for NASAL specimens only), is one component of a comprehensive MRSA colonization surveillance program. It is not intended to diagnose MRSA infection nor to guide or monitor treatment for MRSA infections.   Culture, Urine     Status: None   Collection Time: 06/26/14  1:45 AM  Result Value Ref Range Status   Specimen Description URINE, CATHETERIZED  Final   Special Requests NONE  Final   Colony Count NO GROWTH Performed at [facility]   Final   Culture NO GROWTH Performed at [facility]   Final   Report Status 06/27/2014 FINAL  Final  Culture, Urine     Status: None   Collection Time: 06/27/14 11:51 AM  Result Value Ref Range Status   Specimen Description URINE, CATHETERIZED  Final   Special Requests NONE  Final   Colony Count NO GROWTH Performed at [facility]   Final   Culture NO GROWTH Performed at [facility]   Final   Report Status 06/28/2014 FINAL  Final  Culture, blood (routine x 2)     Status: None   Collection Time: 06/27/14 12:47 PM  Result Value Ref Range Status   Specimen Description BLOOD LEFT ARM  Final   Special Requests BOTTLES DRAWN AEROBIC ONLY 10CC AEROBIC ONLY  Final   Culture   Final    NO GROWTH 5 DAYS Performed at [facility]    Report Status 07/03/2014 FINAL  Final  Culture, blood (routine x 2)     Status: None   Collection Time: 06/27/14 12:54 PM  Result Value Ref Range Status   Specimen Description BLOOD LEFT ARM  Final   Special Requests   Final    BOTTLES DRAWN AEROBIC AND ANAEROBIC 10CC BOTH BOTTLES    Culture   Final    NO GROWTH 5 DAYS Performed at [facility]    Report Status 07/03/2014 FINAL  Final  Clostridium Difficile by PCR (not at [facility])     Status: None   Collection Time: 06/27/14  6:00 PM  Result Value Ref Range Status   C difficile by pcr NEGATIVE NEGATIVE Final  Culture, blood (x 2)     Status: None (Preliminary result)   Collection Time: 07/01/14  7:05 PM  Result Value Ref Range Status   Specimen Description BLOOD LEFT HAND  Final   Special Requests BOTTLES DRAWN AEROBIC ONLY [place]  Final   Culture   Final    NO GROWTH 3 DAYS Performed at [facility]    Report Status PENDING  Incomplete  Culture, blood (x 2)     Status: None (Preliminary result)   Collection Time: 07/01/14  7:20 PM  Result Value Ref Range Status   Specimen Description BLOOD LEFT ARM  Final   Special Requests BOTTLES DRAWN AEROBIC AND ANAEROBIC 10CC  Final   Culture   Final    NO GROWTH 3 DAYS Performed at [facility]    Report Status PENDING  Incomplete    Studies/Results: Ct Chest W Contrast  07/04/2014   CLINICAL DATA:  Diarrhea, fever  EXAM: CT CHEST, ABDOMEN, AND PELVIS WITH CONTRAST  TECHNIQUE: Multidetector CT imaging of the chest, abdomen and pelvis was performed following the standard protocol during bolus administration of intravenous contrast.  CONTRAST:  31mL OMNIPAQUE IOHEXOL 300 MG/ML SOLN, 163mL OMNIPAQUE IOHEXOL 300 MG/ML SOLN  COMPARISON:  None.  FINDINGS: CT CHEST FINDINGS  Mediastinum/Nodes: Great vessels are normal  in caliber. Pulmonary arterial dilation measuring 4.2 cm in transverse diameter image 26 series 2 is identified. Although this examination is not optimized for detection of pulmonary embolism, there is filling defect compatible with clot within the main branches of the right main pulmonary artery. Heart size is mildly enlarged. Small pericardial effusion. No lymphadenopathy. Mild atheromatous aortic calcification. RV: LV ratio is 1.3. This is  abnormally elevated.  Lungs/Pleura: Small pleural effusions are noted with adjacent compressive atelectasis.  Upper abdomen: Please see detailed report below.  Musculoskeletal: No acute osseus abnormality.  CT ABDOMEN AND PELVIS FINDINGS  Lower chest:  Please see detailed report above.  Hepatobiliary: Liver is grossly unremarkable. Mild gallbladder distention noted with possible dependent stones or sludge.  Pancreas: Fatty infiltrated, normal  Spleen: Normal  Adrenals/Urinary Tract: Adrenal glands are normal. Nonobstructing bilateral renal calculi are identified, largest measuring 6 mm at the right mid kidney. No hydroureteronephrosis or radiopaque ureteral or bladder calculus. A Foley catheter is in place and the bladder is decompressed.  Stomach/Bowel: The sigmoid colon is featureless with mild wall thickening and surrounding pericolonic stranding, for example image 106. No surrounding rim enhancing fluid collection to suggest abscess. No bowel dilatation. Normal appendix. Small bowel is normal.  Vascular/Lymphatic: No aortic aneurysm or lymphadenopathy.  Other: Small fat containing umbilical hernia.  No free fluid or air.  Musculoskeletal: Extensive multilevel disc degenerative change is present, most prominent in the lumbar spine.  IMPRESSION: Cardiomegaly, pulmonary arterial dilation, and evidence of right heart strain secondary to incidentally detected large right main pulmonary arterial embolism. Additional emboli could be present but these examination was not tailored for specific depiction of pulmonary arterial enhancement.  Long segmental sigmoid colonic wall thickening with surrounding stranding which may indicate colitis which could be infectious, inflammatory, or infiltrative. Ischemia could also appear similar.  These results were called by telephone at the time of interpretation on 07/04/2014 at 4:51 pm to Dr. Jacquelynn Cree , who verbally acknowledged these results.   Electronically Signed   By:  Conchita Paris M.D.   On: 07/04/2014 16:56   Ct Abdomen Pelvis W Contrast  07/04/2014   CLINICAL DATA:  Diarrhea, fever  EXAM: CT CHEST, ABDOMEN, AND PELVIS WITH CONTRAST  TECHNIQUE: Multidetector CT imaging of the chest, abdomen and pelvis was performed following the standard protocol during bolus administration of intravenous contrast.  CONTRAST:  3mL OMNIPAQUE IOHEXOL 300 MG/ML SOLN, 152mL OMNIPAQUE IOHEXOL 300 MG/ML SOLN  COMPARISON:  None.  FINDINGS: CT CHEST FINDINGS  Mediastinum/Nodes: Great vessels are normal in caliber. Pulmonary arterial dilation measuring 4.2 cm in transverse diameter image 26 series 2 is identified. Although this examination is not optimized for detection of pulmonary embolism, there is filling defect compatible with clot within the main branches of the right main pulmonary artery. Heart size is mildly enlarged. Small pericardial effusion. No lymphadenopathy. Mild atheromatous aortic calcification. RV: LV ratio is 1.3. This is abnormally elevated.  Lungs/Pleura: Small pleural effusions are noted with adjacent compressive atelectasis.  Upper abdomen: Please see detailed report below.  Musculoskeletal: No acute osseus abnormality.  CT ABDOMEN AND PELVIS FINDINGS  Lower chest:  Please see detailed report above.  Hepatobiliary: Liver is grossly unremarkable. Mild gallbladder distention noted with possible dependent stones or sludge.  Pancreas: Fatty infiltrated, normal  Spleen: Normal  Adrenals/Urinary Tract: Adrenal glands are normal. Nonobstructing bilateral renal calculi are identified, largest measuring 6 mm at the right mid kidney. No hydroureteronephrosis or radiopaque ureteral or bladder calculus. A Foley catheter is  in place and the bladder is decompressed.  Stomach/Bowel: The sigmoid colon is featureless with mild wall thickening and surrounding pericolonic stranding, for example image 106. No surrounding rim enhancing fluid collection to suggest abscess. No bowel dilatation.  Normal appendix. Small bowel is normal.  Vascular/Lymphatic: No aortic aneurysm or lymphadenopathy.  Other: Small fat containing umbilical hernia.  No free fluid or air.  Musculoskeletal: Extensive multilevel disc degenerative change is present, most prominent in the lumbar spine.  IMPRESSION: Cardiomegaly, pulmonary arterial dilation, and evidence of right heart strain secondary to incidentally detected large right main pulmonary arterial embolism. Additional emboli could be present but these examination was not tailored for specific depiction of pulmonary arterial enhancement.  Long segmental sigmoid colonic wall thickening with surrounding stranding which may indicate colitis which could be infectious, inflammatory, or infiltrative. Ischemia could also appear similar.  These results were called by telephone at the time of interpretation on 07/04/2014 at 4:51 pm to Dr. Jacquelynn Cree , who verbally acknowledged these results.   Electronically Signed   By: Conchita Paris M.D.   On: 07/04/2014 16:56      Assessment/Plan:  Principal Problem:   Hypovolemic shock in the setting of severe diarrhea Active Problems:   Atrial fibrillation with RVR   Morbid obesity   Hyperlipidemia   Diabetes mellitus type 2, noninsulin dependent   Hypotension   Acute kidney injury   Diarrhea   Dehydration, severe   Hyperkalemia   Hyponatremia   Lactic acidosis   Chronic anticoagulation-Xarelto   Permanent atrial fibrillation   Leukocytosis   Pulmonary vascular congestion   Sepsis   Pressure ulcer   Hypoalbuminemia   Normocytic anemia   Acute renal failure syndrome   Coagulopathy   FUO (fever of unknown origin)   IBD (inflammatory bowel disease)   Other noninfectious gastroenteritis   Myalgia and myositis   Polyarticular arthritis   Acute pulmonary embolism with right heart strain    John Paul is a 74 y.o. male with  74 y.o. male with Morbid obesity, AF w RVR, DM with neuropathy, likely PVD  admitted with diarrhea fevers, sp rx with abx (though without a clear cut target) resolution of diarrhea then recurrence of fevers but initially  without clear cut cause again now found to have evidence of colitis on CT scan, large PE. He also has multiple joints that are exquisitely tender to palpation as well as muscles  #1 FUO:  CT scan has shown two potential causes, PE and colitis ( I would suspect that IBD is strong possibility)   I have sent further FUO labs including Rheum labs such as RF, ANA, anti DS antibody, anti DNA, SSA/SSB, labs for IBD, ESR, CRP, ferritin, LDH, SPEP, HIV (negative), hepatits panel (I did not send QF gold because it raised anxiety for pulmonary TB when test was ordered and I have zero suspicion for pulmonary TB,  CMV and EBV serologies  He will likely need GI evaluation to pin down diagnosis cause of his diarrhea and changes on CT scan though at present on full anticoagulation not a good time  Agree with full anticoagulation  No need for steroids  He may need empiric course of steroids for Autoimmune disease including possible CD, UC  I would formally consult GI and consider phone call to local Rheumatologist as labs come back  I will sign off for now but happy to see the patient again if needed    LOS: 10 days   [employer]  07/05/2014, 3:18 PM

## 2014-07-05 NOTE — Progress Notes (Signed)
ANTICOAGULATION CONSULT NOTE -Follow Up Consult  Pharmacy Consult for heparin Indication: new  pulmonary embolus  No Known Allergies  Patient Measurements: Height: 5\' 10"  (177.8 cm) Weight: (!) 355 lb (161.027 kg) IBW/kg (Calculated) : 73 Heparin Dosing Weight: 111 kg  Vital Signs: Temp: 99.1 F (37.3 C) (06/19 0500) Temp Source: Oral (06/19 0500) BP: 100/55 mmHg (06/19 0600) Pulse Rate: 99 (06/19 0600)  Labs:  Recent Labs  07/02/14 0800 07/04/14 1815 07/04/14 2112 07/05/14 0018 07/05/14 0500 07/05/14 0515  HGB 12.7*  --   --   --  12.5*  --   HCT 38.7*  --   --   --  39.0  --   PLT 349  --   --   --  333  --   APTT  --   --  45*  --  46*  --   HEPARINUNFRC  --   --  >2.20*  --   --  >2.20*  CREATININE 0.93  --   --  0.90  --   --   CKTOTAL  --   --   --   --  20*  --   TROPONINI  --  <0.03  --   --   --   --     Estimated Creatinine Clearance: 115.2 mL/min (by C-G formula based on Cr of 0.9).   Medical History: Past Medical History  Diagnosis Date  . Diabetes mellitus   . Hypertension   . Hyperlipidemia   . Morbid obesity     WITH HYPOVENTILATION  . OSA (obstructive sleep apnea)   . Atrial fibrillation    Assessment: Patient's a  74 y.o M currently on xarelto 20mg  daily for Afib. Chest CT performed this evening now back positive for R PE with R heart strain.  Of note, patient received xarelto 20mg  dose this morning at 1028.  To transition from xarelto to heparin for new PE.  Today (6/19) - 1st heparin level > 2.2 and aPTT = 46 (heparin level still falsely elevated due to Xarelto).  IV heparin currently infusing @ 1200 units/hr (10 units/kg/hr) -No complications of therapy noted -Nearing end of prior Xarelto dose interval, so will increase heparin to full dose (16 units/kg/hr)  Goal of Therapy:  Heparin level 0.3-0.7 units/ml; aPTT 66-102 secs Monitor platelets by anticoagulation protocol: Yes   Plan:  - Increase IV heparin to 1750 units/hr - Check  aPTT and heparin level 6 hr after rate increase - monitor closely for s/s bleeding  Raahil Ong, Joselyn Glassman, PharmD 07/05/2014,6:53 AM

## 2014-07-05 NOTE — Progress Notes (Signed)
Progress Note   John Paul DDU:[id number] DOB: 1940-09-24 DOA: 07/12/2014 PCP: Chesley Noon, MD   Brief Narrative:   John Paul is an 74 y.o. male with a PMH of hypertension, atrial fibrillation on chronic Xarelto, diabetes and dyslipidemia who was admitted 07/10/2014 by the critical care team with hypotension, lactic acidosis, and severe diarrhea. Pt was initially admitted to ICU service where his BP was ultimately stabilized with aggressive IVF hydration. Cdiff was neg as was fecal pathogen panel. The patient's diarrhea gradually improved. During this course, the patient encountered periods of afib with RVR requiring cardizem gtt. on 07/01/14, the patient was noted to have a wheeze with increased respiratory distress and recurrent fever. Empiric vancomycin/Zosyn started and pulmonology called in consultation.  Assessment/Plan:   Principal Problem:   Hypovolemic shock/hypotension in the setting of severe diarrhea with lactic acidosis/severe dehydration rule out sepsis - Status post aggressive fluid volume resuscitation. - Empiric Flagyl started on admission and discontinued 06/29/14 after C. difficile studies negative. - C. difficile negative. Stool pathogen panel negative. - Given ongoing lack of improvement and concerns for occult infection, a CT of the abdomen/pelvis was obtained 07/04/14. - Suspect diarrhea related to ischemic insult to the bowel/colitis in the setting of large pulmonary embolism. - Currently off all antibiotics.  Active Problems:   Acute respiratory distress secondary to pulmonary vascular congestion and large right mainstem pulmonary embolism/ cardiomegaly/ right heart strain - On 07/01/14, the pt developed acute respiratory distress. CXR showed cardiomegaly and pulmonary congestion. Lasix given. - CT of the chest obtained 07/04/14 with an incidentally discovered large right mainstem PE. - Xarelto discontinued and the patient was started on a heparin drip. -  Discussed case with PCCM and IR, no current role for clot dissolution. - Given significant right heart strain, check 2 D Echo.    Oral thrush - Improved. Continue nystatin.    Atrial fibrillation with RVR / permanent atrial fibrillation on chronic anticoagulation with Xarelto - On chronic Xarelto 20 mg daily and atenolol. Atenolol held on admission. Now on heparin given PE. - On 06/26/14, HR increased into the 130s and EKG showed atrial flutter. Patient was started on Cardizem IV. Atenolol resumed. - Cardiology consultation requested after patient developed hypotension necessitating holding atenolol. - Per cardiologist, no plans for inpatient cardioversion. Will need follow-up post discharge. - Suspect atrial fibrillation/flutter acutely worsened rate control secondary to large PE.    Hyperlipidemia - Continue statin therapy.    Severe physical deconditioning - Seen by physical therapy. SNF recommended.    Diabetes mellitus type 2, noninsulin dependent - Hemoglobin A1c 6%. Oral hypoglycemics on hold. - Currently on insulin resistant SSI. CBGs 98-140.    Hyperkalemia / hyponatremia / hypomagnesemia / non-anion gap metabolic acidosis - Hyperkalemia, hyponatremia resolved with IV fluids. Now hypokalemic. Give 40 mEq of oral potassium today. - Magnesium still low, 4 g IV ordered.    Hypocalcemia - Given 1 g of calcium gluconate 06/23/2014.    Leukocytosis / rule out sepsis - Blood cultures done 06/27/2014: Negative. - Urine cultures done 06/20/2014: Negative. - Repeat blood cultures done 07/01/14: Negative to date.    Obstructive sleep apnea / morbid obesity / hypoalbuminemia - BMI 49.07. Continue heart healthy/carbohydrate modified diet. - Dietitian evaluation done 06/26/14. - Not on C Pap. Nocturnal BiPAP started 07/01/14, however patient refused.    Pressure ulcer - Evaluated by wound care nurse, continue silicone dressing over sacral area and elbow. - Continue bariatric mattress and  bilateral pressure  redistribution boots.    Normocytic anemia - Likely anemia of chronic disease. No current indication for transfusion.    Rule out polyarticular arthritis/inflammatory bowel disease  - ESR 130, CK low at 20. LDH normal at 130. Follow-up autoimmune evaluation ordered by ID.    DVT Prophylaxis - On chronic Xarelto.  Code Status: Full. Family Communication: Wife updated at the bedside. Disposition Plan: SNF recommended by PT. SNF when hemodynamically stable, alternative blood thinning regimen defined, likely several more days.   IV Access:    Peripheral IV   Procedures and diagnostic studies:   Dg Forearm Left  07/02/2014   CLINICAL DATA:  Fall last week with right wrist and forearm pain. Pain with supination.  EXAM: LEFT FOREARM - 2 VIEW  COMPARISON:  None.  FINDINGS: There are mild degenerative changes over the elbow and wrist as well as first carpometacarpal joint. No acute fracture or dislocation.  IMPRESSION: No acute findings.   Electronically Signed   By: Elberta Fortis M.D.   On: 07/02/2014 10:56   Ct Chest W Contrast  07/04/2014   CLINICAL DATA:  Diarrhea, fever  EXAM: CT CHEST, ABDOMEN, AND PELVIS WITH CONTRAST  TECHNIQUE: Multidetector CT imaging of the chest, abdomen and pelvis was performed following the standard protocol during bolus administration of intravenous contrast.  CONTRAST:  87mL OMNIPAQUE IOHEXOL 300 MG/ML SOLN, OMNIPAQUE IOHEXOL 300 MG/ML SOLN  COMPARISON:  None.  FINDINGS: CT CHEST FINDINGS  Mediastinum/Nodes: Great vessels are normal in caliber. Pulmonary arterial dilation measuring 4.2 cm in transverse diameter image 26 series 2 is identified. Although this examination is not optimized for detection of pulmonary embolism, there is filling defect compatible with clot within the main branches of the right main pulmonary artery. Heart size is mildly enlarged. Small pericardial effusion. No lymphadenopathy. Mild atheromatous aortic  calcification. RV: LV ratio is 1.3. This is abnormally elevated.  Lungs/Pleura: Small pleural effusions are noted with adjacent compressive atelectasis.  Upper abdomen: Please see detailed report below.  Musculoskeletal: No acute osseus abnormality.  CT ABDOMEN AND PELVIS FINDINGS  Lower chest:  Please see detailed report above.  Hepatobiliary: Liver is grossly unremarkable. Mild gallbladder distention noted with possible dependent stones or sludge.  Pancreas: Fatty infiltrated, normal  Spleen: Normal  Adrenals/Urinary Tract: Adrenal glands are normal. Nonobstructing bilateral renal calculi are identified, largest measuring 6 mm at the right mid kidney. No hydroureteronephrosis or radiopaque ureteral or bladder calculus. A Foley catheter is in place and the bladder is decompressed.  Stomach/Bowel: The sigmoid colon is featureless with mild wall thickening and surrounding pericolonic stranding, for example image 106. No surrounding rim enhancing fluid collection to suggest abscess. No bowel dilatation. Normal appendix. Small bowel is normal.  Vascular/Lymphatic: No aortic aneurysm or lymphadenopathy.  Other: Small fat containing umbilical hernia.  No free fluid or air.  Musculoskeletal: Extensive multilevel disc degenerative change is present, most prominent in the lumbar spine.  IMPRESSION: Cardiomegaly, pulmonary arterial dilation, and evidence of right heart strain secondary to incidentally detected large right main pulmonary arterial embolism. Additional emboli could be present but these examination was not tailored for specific depiction of pulmonary arterial enhancement.  Long segmental sigmoid colonic wall thickening with surrounding stranding which may indicate colitis which could be infectious, inflammatory, or infiltrative. Ischemia could also appear similar.  These results were called by telephone at the time of interpretation on 07/04/2014 at 4:51 pm to Dr. Hillery Aldo , who verbally acknowledged these  results.   Electronically Signed  By: Conchita Paris M.D.   On: 07/04/2014 16:56   Ct Abdomen Pelvis W Contrast  07/04/2014   CLINICAL DATA:  Diarrhea, fever  EXAM: CT CHEST, ABDOMEN, AND PELVIS WITH CONTRAST  TECHNIQUE: Multidetector CT imaging of the chest, abdomen and pelvis was performed following the standard protocol during bolus administration of intravenous contrast.  CONTRAST:  42mL OMNIPAQUE IOHEXOL 300 MG/ML SOLN, 150mL OMNIPAQUE IOHEXOL 300 MG/ML SOLN  COMPARISON:  None.  FINDINGS: CT CHEST FINDINGS  Mediastinum/Nodes: Great vessels are normal in caliber. Pulmonary arterial dilation measuring 4.2 cm in transverse diameter image 26 series 2 is identified. Although this examination is not optimized for detection of pulmonary embolism, there is filling defect compatible with clot within the main branches of the right main pulmonary artery. Heart size is mildly enlarged. Small pericardial effusion. No lymphadenopathy. Mild atheromatous aortic calcification. RV: LV ratio is 1.3. This is abnormally elevated.  Lungs/Pleura: Small pleural effusions are noted with adjacent compressive atelectasis.  Upper abdomen: Please see detailed report below.  Musculoskeletal: No acute osseus abnormality.  CT ABDOMEN AND PELVIS FINDINGS  Lower chest:  Please see detailed report above.  Hepatobiliary: Liver is grossly unremarkable. Mild gallbladder distention noted with possible dependent stones or sludge.  Pancreas: Fatty infiltrated, normal  Spleen: Normal  Adrenals/Urinary Tract: Adrenal glands are normal. Nonobstructing bilateral renal calculi are identified, largest measuring 6 mm at the right mid kidney. No hydroureteronephrosis or radiopaque ureteral or bladder calculus. A Foley catheter is in place and the bladder is decompressed.  Stomach/Bowel: The sigmoid colon is featureless with mild wall thickening and surrounding pericolonic stranding, for example image 106. No surrounding rim enhancing fluid collection to  suggest abscess. No bowel dilatation. Normal appendix. Small bowel is normal.  Vascular/Lymphatic: No aortic aneurysm or lymphadenopathy.  Other: Small fat containing umbilical hernia.  No free fluid or air.  Musculoskeletal: Extensive multilevel disc degenerative change is present, most prominent in the lumbar spine.  IMPRESSION: Cardiomegaly, pulmonary arterial dilation, and evidence of right heart strain secondary to incidentally detected large right main pulmonary arterial embolism. Additional emboli could be present but these examination was not tailored for specific depiction of pulmonary arterial enhancement.  Long segmental sigmoid colonic wall thickening with surrounding stranding which may indicate colitis which could be infectious, inflammatory, or infiltrative. Ischemia could also appear similar.  These results were called by telephone at the time of interpretation on 07/04/2014 at 4:51 pm to Dr. Jacquelynn Cree , who verbally acknowledged these results.   Electronically Signed   By: Conchita Paris M.D.   On: 07/04/2014 16:56   Dg Chest Port 1 View  07/01/2014   CLINICAL DATA:  Shortness of breath.  Weakness.  EXAM: PORTABLE CHEST - 1 VIEW  COMPARISON:  06/27/2014 and 06/30/2014 and 04/14/2005  FINDINGS: There is new pulmonary vascular congestion without pulmonary edema. Chronic cardiomegaly. Chronic tortuosity and calcification of the thoracic aorta. No acute osseous abnormality.  IMPRESSION: Pulmonary vascular congestion.  Chronic cardiomegaly.   Electronically Signed   By: Lorriane Shire M.D.   On: 07/01/2014 18:03   Dg Chest Port 1 View  06/27/2014   CLINICAL DATA:  Shortness of breath with diarrhea intermittently for 2 days. History of diabetes and obesity. Initial encounter.  EXAM: PORTABLE CHEST - 1 VIEW  COMPARISON:  04/14/2005 and 06/29/2014 radiographs.  FINDINGS: 1035 hours. Stable cardiomegaly and mild chronic vascular congestion. No edema, confluent airspace opacity or pleural effusion.  The bones appear unchanged. Telemetry leads overlie the chest.  IMPRESSION: Stable chest with mild cardiomegaly.  No acute findings demonstrated   Electronically Signed   By: Richardean Sale M.D.   On: 06/27/2014 12:21   Dg Chest Port 1 View  07/04/2014   CLINICAL DATA:  Short of breath.  Diarrhea.  Cough.  EXAM: PORTABLE CHEST - 1 VIEW  COMPARISON:  04/14/2005.  FINDINGS: Chronic cardiomegaly. No airspace disease or pleural effusion. Monitoring leads project over the chest. Basilar atelectasis.  IMPRESSION: Chronic cardiomegaly without acute cardiopulmonary disease.   Electronically Signed   By: Dereck Ligas M.D.   On: 06/24/2014 15:29   Dg Abd Portable 1v  06/27/2014   CLINICAL DATA:  Shortness of breath with intermittent diarrhea for 2 days. History of hypertension and obesity. Initial encounter.  EXAM: PORTABLE ABDOMEN - 1 VIEW  COMPARISON:  None.  FINDINGS: Examination is limited by body habitus and breathing artifact. The visualized bowel gas pattern is nonobstructive. There is no supine evidence of free intraperitoneal air or bowel wall thickening. There are no suspicious abdominal calcifications. Degenerative changes are present throughout the visualized spine.  IMPRESSION: No acute findings or explanation for the patient's symptoms. Examination is limited by body habitus and breathing artifact.   Electronically Signed   By: Richardean Sale M.D.   On: 06/27/2014 12:23     Medical Consultants:    ID  PCCM  IR  Anti-Infectives:    Vancomycin 06/28/2014---> 06/26/14, restarted 07/01/14---> 07/03/14  Zosyn 06/24/2014---> 06/26/14, restarted 07/01/14---> 07/03/14  Flagyl 07/15/2014---> 06/29/14  Subjective:   John Paul is more awake and alert.  Still feels sore and weak. No dyspnea.  Appetite poor.  Objective:    Filed Vitals:   07/05/14 0500 07/05/14 0600 07/05/14 0700 07/05/14 0800  BP: 108/52 100/55    Pulse: 99 99 98   Temp: 99.1 F (37.3 C)   98.2 F (36.8 C)  TempSrc: Oral    Oral  Resp: $Remo'25 24 27   'aFdUm$ Height:      Weight:      SpO2: 97% 98% 100%     Intake/Output Summary (Last 24 hours) at 07/05/14 0932 Last data filed at 07/05/14 0700  Gross per 24 hour  Intake 1985.38 ml  Output   1175 ml  Net 810.38 ml    Exam: Gen:  NAD, more awake today Cardiovascular:  HSIR, tachy Respiratory:  Lungs diminished Gastrointestinal:  Abdomen soft, obese, NT, +BS Extremities:  3+ edema with venous stasis changes  Data Reviewed:    Labs: Basic Metabolic Panel:  Recent Labs Lab 06/29/14 0348 07/01/14 0335 07/01/14 1000 07/02/14 0800 07/05/14 0018 07/05/14 0510  NA 135 135  --  138 136  --   K 3.8 4.3  --  3.9 3.2*  --   CL 107 106  --  103 101  --   CO2 22 22  --  24 28  --   GLUCOSE 155* 143*  --  142* 125*  --   BUN 17 15  --  17 17  --   CREATININE 1.04 0.88  --  0.93 0.90  --   CALCIUM 7.3* 7.3*  --  7.4* 7.1*  --   MG 1.5* 1.5* 1.4* 1.4*  --  1.3*   GFR Estimated Creatinine Clearance: 115.2 mL/min (by C-G formula based on Cr of 0.9). Liver Function Tests:  Recent Labs Lab 07/02/14 0800 07/05/14 0018  AST 17 27  ALT 11* 13*  ALKPHOS 70 64  BILITOT 0.4 0.5  PROT 5.2* 5.3*  ALBUMIN 1.9* 1.8*   CBC:  Recent Labs Lab 06/29/14 0348 07/01/14 1000 07/02/14 0800 07/05/14 0500  WBC 17.2* 14.7* 12.5* 7.6  NEUTROABS  --   --   --  5.2  HGB 12.3* 11.8* 12.7* 12.5*  HCT 37.9* 36.2* 38.7* 39.0  MCV 95.7 94.3 94.9 95.6  PLT 333 327 349 333   CBG:  Recent Labs Lab 07/04/14 0743 07/04/14 1154 07/04/14 1653 07/04/14 2148 07/05/14 0802  GLUCAP 119* 129* 98 123* 124*   Sepsis Labs:  Recent Labs Lab 06/29/14 0348 07/01/14 1000 07/01/14 1737 07/01/14 1805 07/01/14 1909 07/02/14 0800 07/05/14 0500  PROCALCITON 0.72  --  0.37  --   --  0.31  --   WBC 17.2* 14.7*  --   --   --  12.5* 7.6  LATICACIDVEN  --   --   --  1.6 1.6  --   --    Microbiology Recent Results (from the past 240 hour(s))  Blood culture (routine x 2)      Status: None   Collection Time: 06/24/2014  3:28 PM  Result Value Ref Range Status   Specimen Description BLOOD RIGHT ARM  Final   Special Requests BOTTLES DRAWN AEROBIC AND ANAEROBIC 5CC EACH  Final   Culture   Final    NO GROWTH 5 DAYS Performed at [facility]    Report Status 07/01/2014 FINAL  Final  Blood culture (routine x 2)     Status: None   Collection Time: 06/18/2014  3:29 PM  Result Value Ref Range Status   Specimen Description BLOOD LEFT ARM  Final   Special Requests BOTTLES DRAWN AEROBIC AND ANAEROBIC 5CC EACH  Final   Culture   Final    NO GROWTH 5 DAYS Performed at [facility]    Report Status 07/01/2014 FINAL  Final  Urine culture     Status: None   Collection Time: 06/24/2014  5:46 PM  Result Value Ref Range Status   Specimen Description Urine  Final   Special Requests Normal  Final   Colony Count NO GROWTH Performed at [facility]   Final   Culture NO GROWTH Performed at [facility]   Final   Report Status 06/26/2014 FINAL  Final  MRSA PCR Screening     Status: None   Collection Time: 06/29/2014 10:01 PM  Result Value Ref Range Status   MRSA by PCR NEGATIVE NEGATIVE Final    Comment:        The GeneXpert MRSA Assay (FDA approved for NASAL specimens only), is one component of a comprehensive MRSA colonization surveillance program. It is not intended to diagnose MRSA infection nor to guide or monitor treatment for MRSA infections.   Culture, Urine     Status: None   Collection Time: 06/26/14  1:45 AM  Result Value Ref Range Status   Specimen Description URINE, CATHETERIZED  Final   Special Requests NONE  Final   Colony Count NO GROWTH Performed at [facility]   Final   Culture NO GROWTH Performed at [facility]   Final   Report Status 06/27/2014 FINAL  Final  Culture, Urine     Status: None   Collection Time: 06/27/14 11:51 AM  Result Value Ref Range Status   Specimen Description URINE,  CATHETERIZED  Final   Special Requests NONE  Final   Colony Count NO GROWTH Performed at [facility]   Final   Culture NO GROWTH  Performed at [facility]   Final   Report Status 06/28/2014 FINAL  Final  Culture, blood (routine x 2)     Status: None   Collection Time: 06/27/14 12:47 PM  Result Value Ref Range Status   Specimen Description BLOOD LEFT ARM  Final   Special Requests BOTTLES DRAWN AEROBIC ONLY 10CC AEROBIC ONLY  Final   Culture   Final    NO GROWTH 5 DAYS Performed at [facility]    Report Status 07/03/2014 FINAL  Final  Culture, blood (routine x 2)     Status: None   Collection Time: 06/27/14 12:54 PM  Result Value Ref Range Status   Specimen Description BLOOD LEFT ARM  Final   Special Requests   Final    BOTTLES DRAWN AEROBIC AND ANAEROBIC 10CC BOTH BOTTLES   Culture   Final    NO GROWTH 5 DAYS Performed at [facility]    Report Status 07/03/2014 FINAL  Final  Clostridium Difficile by PCR (not at [facility])     Status: None   Collection Time: 06/27/14  6:00 PM  Result Value Ref Range Status   C difficile by pcr NEGATIVE NEGATIVE Final  Culture, blood (x 2)     Status: None (Preliminary result)   Collection Time: 07/01/14  7:05 PM  Result Value Ref Range Status   Specimen Description BLOOD LEFT HAND  Final   Special Requests BOTTLES DRAWN AEROBIC ONLY [place]  Final   Culture   Final    NO GROWTH 2 DAYS Performed at [facility]    Report Status PENDING  Incomplete  Culture, blood (x 2)     Status: None (Preliminary result)   Collection Time: 07/01/14  7:20 PM  Result Value Ref Range Status   Specimen Description BLOOD LEFT ARM  Final   Special Requests BOTTLES DRAWN AEROBIC AND ANAEROBIC 10CC  Final   Culture   Final    NO GROWTH 2 DAYS Performed at [facility]    Report Status PENDING  Incomplete     Medications:   . antiseptic oral rinse  7 mL Mouth Rinse q12n4p  . atenolol  100 mg Oral Daily  .  atorvastatin  10 mg Oral QHS  . chlorhexidine  15 mL Mouth Rinse BID  . gabapentin  100 mg Oral BID  . insulin aspart  0-20 Units Subcutaneous TID WC  . insulin aspart  0-5 Units Subcutaneous QHS  . nystatin  5 mL Oral QID  . nystatin   Topical TID   Continuous Infusions: . sodium chloride Stopped (06/29/14 0806)  . sodium chloride 1,000 mL (07/04/14 2305)  . diltiazem (CARDIZEM) infusion Stopped (06/29/14 0930)  . heparin 1,750 Units/hr (07/05/14 6578)    Time spent: 35 minutes.  The patient is medically complex and requires high complexity decision making.    LOS: 10 days   [facility] Pager [phone]. If unable to reach me by pager, please call my cell phone at [phone].  *Please refer to [email or web address], password [email or web address] to get updated schedule on who will round on this patient, as hospitalists switch teams weekly. If 7PM-7AM, please contact night-coverage at [email or web address], password [email or web address] for any overnight needs.  07/05/2014, 9:32 AM

## 2014-07-05 NOTE — Progress Notes (Signed)
Spoke with Dr. Daiva Eves with respect to lab panel including Quantiferon screen.  It was explained by Dr. Daiva Eves that the panel was submitted in response to a fever of unknown origin, but that the probability of a positive TB is not likely.  Based on this information, it was determined that a negative pressure room and/or airborne precautions are not recommended at this time.

## 2014-07-05 NOTE — Progress Notes (Addendum)
ANTICOAGULATION CONSULT NOTE -Follow Up Consult  Pharmacy Consult for heparin Indication: new  pulmonary embolus  No Known Allergies  Patient Measurements: Height: 5\' 10"  (177.8 cm) Weight: (!) 355 lb (161.027 kg) IBW/kg (Calculated) : 73 Heparin Dosing Weight: 111 kg  Vital Signs: Temp: 97.4 F (36.3 C) (06/19 1200) Temp Source: Oral (06/19 1200) BP: 118/56 mmHg (06/19 0800) Pulse Rate: 97 (06/19 0800)  Labs:  Recent Labs  07/04/14 1815 07/04/14 2112 07/05/14 0018 07/05/14 0500 07/05/14 0515  HGB  --   --   --  12.5*  --   HCT  --   --   --  39.0  --   PLT  --   --   --  333  --   APTT  --  45*  --  46*  --   HEPARINUNFRC  --  >2.20*  --   --  >2.20*  CREATININE  --   --  0.90  --   --   CKTOTAL  --   --   --  20*  --   TROPONINI <0.03  --   --   --   --     Estimated Creatinine Clearance: 115.2 mL/min (by C-G formula based on Cr of 0.9).   Medical History: Past Medical History  Diagnosis Date  . Diabetes mellitus   . Hypertension   . Hyperlipidemia   . Morbid obesity     WITH HYPOVENTILATION  . OSA (obstructive sleep apnea)   . Atrial fibrillation   . Acute pulmonary embolism with right heart strain 07/05/2014   Assessment: Patient's a  74 y.o M currently on xarelto 20mg  daily for Afib. Chest CT performed this evening now back positive for R PE with R heart strain.  Of note, patient received xarelto 20mg  dose on 6/18 at 1028.  To transition from xarelto to heparin for new PE.  Today (6/19) -Heparin level >2.2 and aPTT = 89sec (heparin level still falsely elevated due to Xarelto).  IV heparin currently infusing at 1750 units/hr. Dose more aggressively increased this am as last xarelto dose given 24h prior  Goal of Therapy:  Heparin level 0.3-0.7 units/ml; aPTT 66-102 secs Monitor platelets by anticoagulation protocol: Yes   Plan:  - Continue IV heparin at 1750 units/hr (aPTT therapeutic) - Check aPTT and heparin level with am labs - monitor closely  for s/s bleeding  Juliette Alcide, PharmD, BCPS.   Pager: [phone] 07/05/2014,4:04 PM

## 2014-07-05 NOTE — Progress Notes (Signed)
  Echocardiogram 2D Echocardiogram has been performed.  John Paul 07/05/2014, 12:31 PM

## 2014-07-05 NOTE — Progress Notes (Signed)
Initial Nutrition Assessment  DOCUMENTATION CODES:  Morbid obesity  INTERVENTION: - Will order Prostat TID, each supplement will provide 100 kcal and 15 grams protein - RD will continue to monitor for needs  NUTRITION DIAGNOSIS:  Increased nutrient needs related to wound healing as evidenced by estimated needs.  GOAL:  Patient will meet greater than or equal to 90% of their needs  MONITOR:  PO intake, Supplement acceptance, Weight trends, Labs, I & O's  REASON FOR ASSESSMENT:  Consult Wound healing  ASSESSMENT: 74 y.o. M with PMH of DM, HTN, HLD, A.fib (on xarelto), OSA, morbid obesity. He was brought to [facility] ED 6/9 due to diarrhea x 6 days. He apparently was constipated prior to that and tried drinking more fluids and tried OTC constipation pills. The following day, he began to have diarrhea with > 5 episodes per day since then. He has had decreased PO intake during this time as he feels it worsens his diarrhea. Tried to drink fluids but has not been able to do so.  Pt seen for consult for wound healing. BMI indicates morbid obesity. Pt's appetite was decreased for several days to weeks PTA. Pt ate 20% of dinner 6/16 with no other intakes recorded recently. Visualized breakfast tray with 25-50% completion. Pt was provided with healthy eating education on 07/03/14 following consult for such. Per notes, pt with oral thrush which has been affecting intakes.  Not meeting needs at this time. Will order Prostat to supplement protein intakes. No muscle or fat wasting but with some mild edema noted. Unsure of weight changes PTA and weight hx does not list any recent weights.   Medications reviewed. Labs reviewed; CBGs: 98-124 mg/dL, K: 3.2 mmol/L, Ca: 7.1 mg/dL.  Height:  Ht Readings from Last 1 Encounters:  06/26/14 5\' 10"  (1.778 m)    Weight:  Wt Readings from Last 1 Encounters:  07/05/14 355 lb (161.027 kg)    Ideal Body Weight:  75.45 kg (kg)  Wt Readings from Last  10 Encounters:  07/05/14 355 lb (161.027 kg)  01/13/14 316 lb 4.8 oz (143.473 kg)  12/18/12 367 lb 6.4 oz (166.652 kg)  09/07/11 391 lb 12.8 oz (177.719 kg)  02/08/11 387 lb (175.542 kg)  08/03/10 390 lb 9.6 oz (177.175 kg)    BMI:  Body mass index is 50.94 kg/(m^2).  Estimated Nutritional Needs:  Kcal:  2300-2500  Protein:  115-125 grams  Fluid:  2.2-2.5 L/day  Skin:  Wound (see comment) (stage 2 sacral wound)  Diet Order:  Diet heart healthy/carb modified Room service appropriate?: Yes; Fluid consistency:: Thin  EDUCATION NEEDS:  Education needs addressed   Intake/Output Summary (Last 24 hours) at 07/05/14 1044 Last data filed at 07/05/14 0700  Gross per 24 hour  Intake 1985.38 ml  Output    700 ml  Net 1285.38 ml    Last BM:  6/18   Trenton Gammon, RD, LDN Inpatient Clinical Dietitian Pager # [phone] After hours/weekend pager # [phone]

## 2014-07-05 NOTE — H&P (Signed)
Chief Complaint: Chief Complaint  Patient presents with  . Shortness of Breath  . Diarrhea  . Cough    Referring Physician(s): TRH  History of Present Illness: John Paul is a 74 y.o. male who presented to ED on 6/9 with c/o diarrhea, weakness, shortness of breath and RLE pain post fall. He was found to be hypotensive with dehydration, afib RVR and FUO. Recent CT revealed possible colitis and right heart strain with PE. IR received request for consult. Patient denies and chest pain, lightheadedness or dizziness. He does c/o shortness of breath and is wearing 2L O2 which he states he does not wear at home. He admits to compliance with home Xarelto and denies any active bleeding. He denies any recent surgeries, recent GI bleed or history of hemorrhagic stroke or brain tumor.     Past Medical History  Diagnosis Date  . Diabetes mellitus   . Hypertension   . Hyperlipidemia   . Morbid obesity     WITH HYPOVENTILATION  . OSA (obstructive sleep apnea)   . Atrial fibrillation   . Acute pulmonary embolism with right heart strain 07/05/2014    Past Surgical History  Procedure Laterality Date  . Vasectomy    . US echocardiography  02/12/2009    EF 55-60%    Allergies: Review of patient's allergies indicates no known allergies.  Medications: Prior to Admission medications   Medication Sig Start Date End Date Taking? Authorizing Provider  Alcohol Swabs (B-D SINGLE USE SWABS REGULAR) PADS  06/19/14  Yes Historical Provider, MD  atenolol (TENORMIN) 100 MG tablet  01/02/14  Yes Historical Provider, MD  docusate sodium (COLACE) 100 MG capsule Take 100 mg by mouth 2 (two) times daily.   Yes Historical Provider, MD  metFORMIN (GLUCOPHAGE-XR) 500 MG 24 hr tablet Take 500 mg by mouth 2 (two) times daily. 01/15/14  Yes Historical Provider, MD  Multiple Vitamin (MULTIVITAMIN) tablet Take 1 tablet by mouth daily.     Yes Historical Provider, MD  OVER THE COUNTER MEDICATION 1 tablet daily  as needed (Sudafed- strength unknown).   Yes Historical Provider, MD  quinapril (ACCUPRIL) 20 MG tablet  01/02/14  Yes Historical Provider, MD  rivaroxaban (XARELTO) 20 MG TABS tablet Take 1 tablet (20 mg total) by mouth daily. 01/13/14  Yes Peter M [place], MD  simvastatin (ZOCOR) 20 MG tablet Take 20 mg by mouth at bedtime.     Yes Historical Provider, MD  traMADol Janean Sark) 50 MG tablet  07/30/10  Yes Historical Provider, MD  triamterene-hydrochlorothiazide (DYAZIDE) 37.5-25 MG per capsule Take 1 capsule by mouth every morning.     Yes Historical Provider, MD  TRUE METRIX BLOOD GLUCOSE TEST test strip  06/19/14  Yes Historical Provider, MD  glyBURIDE-metformin (GLUCOVANCE) 2.5-500 MG per tablet Take 1 tablet by mouth 2 (two) times daily with a meal.      Historical Provider, MD     Family History  Problem Relation Age of Onset  . Clotting disorder Father     History   Social History  . Marital Status: Married    Spouse Name: N/A  . Number of Children: 2  . Years of Education: N/A   Occupational History  . retired    Social History Main Topics  . Smoking status: Former Smoker    Quit date: 07/27/1970  . Smokeless tobacco: Not on file  . Alcohol Use: No  . Drug Use: No  . Sexual Activity: Not on file   Other  Topics Concern  . None   Social History Narrative   Review of Systems: A 12 point ROS discussed and pertinent positives are indicated in the HPI above.  All other systems are negative.  Review of Systems  Vital Signs: BP 100/55 mmHg  Pulse 98  Temp(Src) 98.2 F (36.8 C) (Oral)  Resp 27  Ht 5\' 10"  (1.778 m)  Wt 355 lb (161.027 kg)  BMI 50.94 kg/m2  SpO2 100%  Physical Exam  Constitutional: He is oriented to person, place, and time. No distress.  HENT:  Head: Normocephalic and atraumatic.  Neck: No tracheal deviation present.  Cardiovascular:  IRR IRR  Pulmonary/Chest: Effort normal.  Abdominal: Soft. Bowel sounds are normal.  Musculoskeletal:  Venous  stasis changes  Neurological: He is alert and oriented to person, place, and time.  Skin: He is not diaphoretic.    Mallampati Score:     Imaging: Dg Forearm Left  07/02/2014   CLINICAL DATA:  Fall last week with right wrist and forearm pain. Pain with supination.  EXAM: LEFT FOREARM - 2 VIEW  COMPARISON:  None.  FINDINGS: There are mild degenerative changes over the elbow and wrist as well as first carpometacarpal joint. No acute fracture or dislocation.  IMPRESSION: No acute findings.   Electronically Signed   By: Elberta Fortis M.D.   On: 07/02/2014 10:56   Ct Chest W Contrast  07/04/2014   CLINICAL DATA:  Diarrhea, fever  EXAM: CT CHEST, ABDOMEN, AND PELVIS WITH CONTRAST  TECHNIQUE: Multidetector CT imaging of the chest, abdomen and pelvis was performed following the standard protocol during bolus administration of intravenous contrast.  CONTRAST:  50mL OMNIPAQUE IOHEXOL 300 MG/ML SOLN, OMNIPAQUE IOHEXOL 300 MG/ML SOLN  COMPARISON:  None.  FINDINGS: CT CHEST FINDINGS  Mediastinum/Nodes: Great vessels are normal in caliber. Pulmonary arterial dilation measuring 4.2 cm in transverse diameter image 26 series 2 is identified. Although this examination is not optimized for detection of pulmonary embolism, there is filling defect compatible with clot within the main branches of the right main pulmonary artery. Heart size is mildly enlarged. Small pericardial effusion. No lymphadenopathy. Mild atheromatous aortic calcification. RV: LV ratio is 1.3. This is abnormally elevated.  Lungs/Pleura: Small pleural effusions are noted with adjacent compressive atelectasis.  Upper abdomen: Please see detailed report below.  Musculoskeletal: No acute osseus abnormality.  CT ABDOMEN AND PELVIS FINDINGS  Lower chest:  Please see detailed report above.  Hepatobiliary: Liver is grossly unremarkable. Mild gallbladder distention noted with possible dependent stones or sludge.  Pancreas: Fatty infiltrated, normal   Spleen: Normal  Adrenals/Urinary Tract: Adrenal glands are normal. Nonobstructing bilateral renal calculi are identified, largest measuring 6 mm at the right mid kidney. No hydroureteronephrosis or radiopaque ureteral or bladder calculus. A Foley catheter is in place and the bladder is decompressed.  Stomach/Bowel: The sigmoid colon is featureless with mild wall thickening and surrounding pericolonic stranding, for example image 106. No surrounding rim enhancing fluid collection to suggest abscess. No bowel dilatation. Normal appendix. Small bowel is normal.  Vascular/Lymphatic: No aortic aneurysm or lymphadenopathy.  Other: Small fat containing umbilical hernia.  No free fluid or air.  Musculoskeletal: Extensive multilevel disc degenerative change is present, most prominent in the lumbar spine.  IMPRESSION: Cardiomegaly, pulmonary arterial dilation, and evidence of right heart strain secondary to incidentally detected large right main pulmonary arterial embolism. Additional emboli could be present but these examination was not tailored for specific depiction of pulmonary arterial enhancement.  Long segmental sigmoid  colonic wall thickening with surrounding stranding which may indicate colitis which could be infectious, inflammatory, or infiltrative. Ischemia could also appear similar.  These results were called by telephone at the time of interpretation on 07/04/2014 at 4:51 pm to Dr. Hillery Aldo , who verbally acknowledged these results.   Electronically Signed   By: Christiana Pellant M.D.   On: 07/04/2014 16:56   Ct Abdomen Pelvis W Contrast  07/04/2014   CLINICAL DATA:  Diarrhea, fever  EXAM: CT CHEST, ABDOMEN, AND PELVIS WITH CONTRAST  TECHNIQUE: Multidetector CT imaging of the chest, abdomen and pelvis was performed following the standard protocol during bolus administration of intravenous contrast.  CONTRAST:  23mL OMNIPAQUE IOHEXOL 300 MG/ML SOLN, OMNIPAQUE IOHEXOL 300 MG/ML SOLN  COMPARISON:  None.   FINDINGS: CT CHEST FINDINGS  Mediastinum/Nodes: Great vessels are normal in caliber. Pulmonary arterial dilation measuring 4.2 cm in transverse diameter image 26 series 2 is identified. Although this examination is not optimized for detection of pulmonary embolism, there is filling defect compatible with clot within the main branches of the right main pulmonary artery. Heart size is mildly enlarged. Small pericardial effusion. No lymphadenopathy. Mild atheromatous aortic calcification. RV: LV ratio is 1.3. This is abnormally elevated.  Lungs/Pleura: Small pleural effusions are noted with adjacent compressive atelectasis.  Upper abdomen: Please see detailed report below.  Musculoskeletal: No acute osseus abnormality.  CT ABDOMEN AND PELVIS FINDINGS  Lower chest:  Please see detailed report above.  Hepatobiliary: Liver is grossly unremarkable. Mild gallbladder distention noted with possible dependent stones or sludge.  Pancreas: Fatty infiltrated, normal  Spleen: Normal  Adrenals/Urinary Tract: Adrenal glands are normal. Nonobstructing bilateral renal calculi are identified, largest measuring 6 mm at the right mid kidney. No hydroureteronephrosis or radiopaque ureteral or bladder calculus. A Foley catheter is in place and the bladder is decompressed.  Stomach/Bowel: The sigmoid colon is featureless with mild wall thickening and surrounding pericolonic stranding, for example image 106. No surrounding rim enhancing fluid collection to suggest abscess. No bowel dilatation. Normal appendix. Small bowel is normal.  Vascular/Lymphatic: No aortic aneurysm or lymphadenopathy.  Other: Small fat containing umbilical hernia.  No free fluid or air.  Musculoskeletal: Extensive multilevel disc degenerative change is present, most prominent in the lumbar spine.  IMPRESSION: Cardiomegaly, pulmonary arterial dilation, and evidence of right heart strain secondary to incidentally detected large right main pulmonary arterial embolism.  Additional emboli could be present but these examination was not tailored for specific depiction of pulmonary arterial enhancement.  Long segmental sigmoid colonic wall thickening with surrounding stranding which may indicate colitis which could be infectious, inflammatory, or infiltrative. Ischemia could also appear similar.  These results were called by telephone at the time of interpretation on 07/04/2014 at 4:51 pm to Dr. Hillery Aldo , who verbally acknowledged these results.   Electronically Signed   By: Christiana Pellant M.D.   On: 07/04/2014 16:56   Dg Chest Port 1 View  07/01/2014   CLINICAL DATA:  Shortness of breath.  Weakness.  EXAM: PORTABLE CHEST - 1 VIEW  COMPARISON:  06/27/2014 and 09-Jul-2014 and 04/14/2005  FINDINGS: There is new pulmonary vascular congestion without pulmonary edema. Chronic cardiomegaly. Chronic tortuosity and calcification of the thoracic aorta. No acute osseous abnormality.  IMPRESSION: Pulmonary vascular congestion.  Chronic cardiomegaly.   Electronically Signed   By: Francene Boyers M.D.   On: 07/01/2014 18:03   Dg Chest Port 1 View  06/27/2014   CLINICAL DATA:  Shortness of breath with  diarrhea intermittently for 2 days. History of diabetes and obesity. Initial encounter.  EXAM: PORTABLE CHEST - 1 VIEW  COMPARISON:  04/14/2005 and 06/28/2014 radiographs.  FINDINGS: 1035 hours. Stable cardiomegaly and mild chronic vascular congestion. No edema, confluent airspace opacity or pleural effusion. The bones appear unchanged. Telemetry leads overlie the chest.  IMPRESSION: Stable chest with mild cardiomegaly.  No acute findings demonstrated   Electronically Signed   By: Carey Bullocks M.D.   On: 06/27/2014 12:21   Dg Chest Port 1 View  07/08/2014   CLINICAL DATA:  Short of breath.  Diarrhea.  Cough.  EXAM: PORTABLE CHEST - 1 VIEW  COMPARISON:  04/14/2005.  FINDINGS: Chronic cardiomegaly. No airspace disease or pleural effusion. Monitoring leads project over the chest. Basilar  atelectasis.  IMPRESSION: Chronic cardiomegaly without acute cardiopulmonary disease.   Electronically Signed   By: Andreas Newport M.D.   On: 06/26/2014 15:29   Dg Abd Portable 1v  06/27/2014   CLINICAL DATA:  Shortness of breath with intermittent diarrhea for 2 days. History of hypertension and obesity. Initial encounter.  EXAM: PORTABLE ABDOMEN - 1 VIEW  COMPARISON:  None.  FINDINGS: Examination is limited by body habitus and breathing artifact. The visualized bowel gas pattern is nonobstructive. There is no supine evidence of free intraperitoneal air or bowel wall thickening. There are no suspicious abdominal calcifications. Degenerative changes are present throughout the visualized spine.  IMPRESSION: No acute findings or explanation for the patient's symptoms. Examination is limited by body habitus and breathing artifact.   Electronically Signed   By: Carey Bullocks M.D.   On: 06/27/2014 12:23    Labs:  CBC:  Recent Labs  06/29/14 0348 07/01/14 1000 07/02/14 0800 07/05/14 0500  WBC 17.2* 14.7* 12.5* 7.6  HGB 12.3* 11.8* 12.7* 12.5*  HCT 37.9* 36.2* 38.7* 39.0  PLT 333 327 349 333    COAGS:  Recent Labs  06/27/2014 1546 06/26/14 0406 07/04/14 2112 07/05/14 0500  INR 3.70*  --   --   --   APTT 38* 43* 45* 46*    BMP:  Recent Labs  06/29/14 0348 07/01/14 0335 07/02/14 0800 07/05/14 0018  NA 135 135 138 136  K 3.8 4.3 3.9 3.2*  CL 107 106 103 101  CO2 GLUCOSE 155* 143* 142* 125*  BUN CALCIUM 7.3* 7.3* 7.4* 7.1*  CREATININE 1.04 0.88 0.93 0.90  GFRNONAA >60 >60 >60 >60  GFRAA >60 >60 >60 >60    LIVER FUNCTION TESTS:  Recent Labs  07/15/2014 1653 06/27/14 0403 07/02/14 0800 07/05/14 0018  BILITOT 0.8 0.4 0.4 0.5  AST ALT 15* 14* 11* 13*  ALKPHOS 71 66 70 64  PROT 6.1* 5.3* 5.2* 5.3*  ALBUMIN 2.8* 2.3* 1.9* 1.8*   Assessment and Plan: Diarrhea, CT findings with possible colitis  FUO-ID following Shortness of  breath without chest pain, trop negative, CT chest evidence of right main PE and right heart strain-on IV heparin and 2L O2 OSA-non compliant with CPAP PCCM on board IR received request for consult regarding catheter directed PE lysis Reviewed case and imaging with Dr. Bonnielee Haff today and minimal heart strain on CT, would recommend Echo for further evaluation, clot burden moderate, dilated pulmonary artery consistent with chronic pulmonary HTN, trop negative Morbidly obese At this time no urgent indication for catheter directed PE lysis, continue IV Heparin and check Echo Afib on chronic Xarelto-states compliance  DM  Thank you for this interesting consult.  I greatly enjoyed meeting John Paul and look forward to participating in their care.  SignedBerneta Levins 07/05/2014, 10:48 AM   I spent a total of 40 Minutes in face to face in clinical consultation, greater than 50% of which was counseling/coordinating care for pulmonary embolism.

## 2014-07-05 NOTE — Clinical Social Work Note (Signed)
Clinical Social Work Assessment  Patient Details  Name: John Paul MRN: [id number] Date of Birth: 09/25/1940  Date of referral:  07/05/14               Reason for consult:  Facility Placement                Permission sought to share information with:  Facility Contact Representative, Family Supports Permission granted to share information::  Yes, Verbal Permission Granted  Name::        Agency::     Relationship::     Contact Information:     Housing/Transportation Living arrangements for the past 2 months:  Single Family Home Source of Information:  Spouse Patient Interpreter Needed:  None Criminal Activity/Legal Involvement Pertinent to Current Situation/Hospitalization:    Significant Relationships:    Lives with:  Spouse Do you feel safe going back to the place where you live?    Need for family participation in patient care:  Yes (Comment)  Care giving concerns:  Pt's wife is only concerned with getting pt stronger so he can return home   Social Worker assessment / plan:  CSW met with pt and his wife at bedside.  Pt was getting an echo while CSW spoke with his wife.  CSW explained role of CSW and also provided information about SNF process.  CSW prompted pt's wife to discuss history and needs of pt.  CSW provided active and supportive listening.  CSW will send pt information to SNF's in [place] area  Employment status:  Retired Insurance information:    PT Recommendations:  Skilled Nursing Facility Information / Referral to community resources:     Patient/Family's Response to care:  Pt's wife stated that they are both retired and live alone.  Pt's wife stated that pt has never been to a SNF and only hospitalized twice in his life.  Pt's wife open to rehab for pt stating she was thankful for anything that would eventually get pt home  Patient/Family's Understanding of and Emotional Response to Diagnosis, Current Treatment, and Prognosis:  Pt in middle of  procedure. Pt's wife aware of pt's diagnoses and very worried.  Pt's wife also grateful for CSW support and follow through for SNF.  Emotional Assessment Appearance:  Appears stated age Attitude/Demeanor/Rapport:  Unable to Assess Affect (typically observed):  Unable to Assess Orientation:  Oriented to Self, Oriented to Place, Oriented to Situation Alcohol / Substance use:    Psych involvement (Current and /or in the community):  No (Comment)  Discharge Needs  Concerns to be addressed:    Readmission within the last 30 days:    Current discharge risk:    Barriers to Discharge:      , G, LCSW 07/05/2014, 4:42 PM  

## 2014-07-06 ENCOUNTER — Inpatient Hospital Stay (HOSPITAL_COMMUNITY): Payer: Medicare HMO

## 2014-07-06 DIAGNOSIS — L97409 Non-pressure chronic ulcer of unspecified heel and midfoot with unspecified severity: Secondary | ICD-10-CM

## 2014-07-06 DIAGNOSIS — I50811 Acute right heart failure: Secondary | ICD-10-CM

## 2014-07-06 LAB — HEMOGLOBIN A1C
Hgb A1c MFr Bld: 6.3 % — ABNORMAL HIGH (ref 4.8–5.6)
Mean Plasma Glucose: 134 mg/dL

## 2014-07-06 LAB — URINALYSIS, ROUTINE W REFLEX MICROSCOPIC
BILIRUBIN URINE: NEGATIVE
GLUCOSE, UA: NEGATIVE mg/dL
Ketones, ur: NEGATIVE mg/dL
NITRITE: POSITIVE — AB
Protein, ur: NEGATIVE mg/dL
Specific Gravity, Urine: 1.021 (ref 1.005–1.030)
UROBILINOGEN UA: 1 mg/dL (ref 0.0–1.0)
pH: 5.5 (ref 5.0–8.0)

## 2014-07-06 LAB — BASIC METABOLIC PANEL
ANION GAP: 8 (ref 5–15)
BUN: 14 mg/dL (ref 6–20)
CO2: 28 mmol/L (ref 22–32)
Calcium: 7.7 mg/dL — ABNORMAL LOW (ref 8.9–10.3)
Chloride: 102 mmol/L (ref 101–111)
Creatinine, Ser: 0.76 mg/dL (ref 0.61–1.24)
GFR calc non Af Amer: >60 mL/min (ref >60)
Glucose, Bld: 152 mg/dL — ABNORMAL HIGH (ref 65–99)
POTASSIUM: 3.4 mmol/L — AB (ref 3.5–5.1)
SODIUM: 138 mmol/L (ref 135–145)

## 2014-07-06 LAB — GLUCOSE, CAPILLARY
GLUCOSE-CAPILLARY: 132 mg/dL — AB (ref 65–99)
Glucose-Capillary: 116 mg/dL — ABNORMAL HIGH (ref 65–99)
Glucose-Capillary: 136 mg/dL — ABNORMAL HIGH (ref 65–99)
Glucose-Capillary: 98 mg/dL (ref 65–99)

## 2014-07-06 LAB — RHEUMATOID FACTOR: Rhuematoid fact SerPl-aCnc: 14.8 IU/mL — ABNORMAL HIGH (ref 0.0–13.9)

## 2014-07-06 LAB — CBC
HCT: 32.2 % — ABNORMAL LOW (ref 39.0–52.0)
HEMOGLOBIN: 10.3 g/dL — AB (ref 13.0–17.0)
MCH: 31 pg (ref 26.0–34.0)
MCHC: 32 g/dL (ref 30.0–36.0)
MCV: 97 fL (ref 78.0–100.0)
PLATELETS: 402 10*3/uL — AB (ref 150–400)
RBC: 3.32 MIL/uL — ABNORMAL LOW (ref 4.22–5.81)
RDW: 13.8 % (ref 11.5–15.5)
WBC: 7.6 10*3/uL (ref 4.0–10.5)

## 2014-07-06 LAB — HEPATITIS PANEL, ACUTE
HCV Ab: <0.1 s/co ratio (ref 0.0–0.9)
HEP A IGM: NEGATIVE
HEP B C IGM: NEGATIVE
Hepatitis B Surface Ag: NEGATIVE

## 2014-07-06 LAB — HEPARIN LEVEL (UNFRACTIONATED): Heparin Unfractionated: >2.2 IU/mL — ABNORMAL HIGH (ref 0.30–0.70)

## 2014-07-06 LAB — URINE MICROSCOPIC-ADD ON

## 2014-07-06 LAB — MAGNESIUM: Magnesium: 1.8 mg/dL (ref 1.7–2.4)

## 2014-07-06 LAB — SJOGRENS SYNDROME-B EXTRACTABLE NUCLEAR ANTIBODY: SSB (La) (ENA) Antibody, IgG: <0.2 AI (ref 0.0–0.9)

## 2014-07-06 LAB — APTT: APTT: 76 s — AB (ref 24–37)

## 2014-07-06 LAB — ANTI-DNA ANTIBODY, DOUBLE-STRANDED: ds DNA Ab: <1 IU/mL (ref 0–9)

## 2014-07-06 LAB — MPO/PR-3 (ANCA) ANTIBODIES

## 2014-07-06 LAB — SJOGRENS SYNDROME-A EXTRACTABLE NUCLEAR ANTIBODY: SSA (Ro) (ENA) Antibody, IgG: <0.2 AI (ref 0.0–0.9)

## 2014-07-06 LAB — ANTINUCLEAR ANTIBODIES, IFA: ANTINUCLEAR ANTIBODIES, IFA: NEGATIVE

## 2014-07-06 MED ORDER — HALOPERIDOL LACTATE 5 MG/ML IJ SOLN
2.0000 mg | Freq: Once | INTRAMUSCULAR | Status: AC
  Administered 2014-07-06: 2 mg via INTRAVENOUS
  Filled 2014-07-06: qty 1

## 2014-07-06 MED ORDER — POTASSIUM CHLORIDE CRYS ER 20 MEQ PO TBCR
40.0000 meq | EXTENDED_RELEASE_TABLET | Freq: Once | ORAL | Status: AC
  Administered 2014-07-06: 40 meq via ORAL
  Filled 2014-07-06: qty 2

## 2014-07-06 NOTE — Progress Notes (Signed)
Progress Note   John Paul EVO:[id number] DOB: [birth year] DOA: 06/23/2014 PCP: Chesley Noon, MD   Brief Narrative:   John Paul is an 74 y.o. male with a PMH of hypertension, atrial fibrillation on chronic Xarelto, diabetes and dyslipidemia who was admitted 06/20/2014 by the critical care team with hypotension, lactic acidosis, and severe diarrhea. Pt was initially admitted to ICU service where his BP was ultimately stabilized with aggressive IVF hydration. Cdiff was neg as was fecal pathogen panel. The patient's diarrhea gradually improved. During this course, the patient encountered periods of afib with RVR requiring cardizem gtt. on 07/01/14, the patient was noted to have a wheeze with increased respiratory distress and recurrent fever. Empiric vancomycin/Zosyn started and pulmonology called in consultation.  Assessment/Plan:   Principal Problem:   Hypovolemic shock/hypotension in the setting of severe diarrhea with lactic acidosis/severe dehydration rule out sepsis - Status post aggressive fluid volume resuscitation. - Empiric Flagyl started on admission and discontinued 06/29/14 after C. difficile studies negative. - C. difficile negative. Stool pathogen panel negative. - Given ongoing lack of improvement and concerns for occult infection, a CT of the abdomen/pelvis was obtained 07/04/14. - Suspect diarrhea related to ischemic insult to the bowel/colitis in the setting of large pulmonary embolism. - Currently off all antibiotics.  Active Problems:   Acute respiratory distress secondary to pulmonary vascular congestion and large right mainstem pulmonary embolism/ cardiomegaly/ right heart strain / acute systolic right heart failure - On 07/01/14, the pt developed acute respiratory distress. CXR showed cardiomegaly and pulmonary congestion. Lasix given. - CT of the chest obtained 07/04/14 with an incidentally discovered large right mainstem PE. - Xarelto discontinued and the  patient was started on a heparin drip. - Discussed case with PCCM and IR, no current role for clot dissolution. - 2-D echo done 07/05/14. EF 55-60 percent, no regional wall motion abnormality but systolic function reduced in the right ventricle.    Oral thrush - Improved. Continue nystatin.    Atrial fibrillation with RVR / permanent atrial fibrillation on chronic anticoagulation with Xarelto - On chronic Xarelto 20 mg daily and atenolol. Atenolol held on admission. Now on heparin given PE. - On 06/26/14, HR increased into the 130s and EKG showed atrial flutter. Patient was started on Cardizem IV. Atenolol resumed. - Cardiology consultation requested after patient developed hypotension necessitating holding atenolol. - Per cardiologist, no plans for inpatient cardioversion. Will need follow-up post discharge. - Suspect atrial fibrillation/flutter acutely worsened rate control secondary to large PE. Heart rate 90s-100s.    Hyperlipidemia - Continue statin therapy.    Severe physical deconditioning - Seen by physical therapy. SNF recommended.    Diabetes mellitus type 2, noninsulin dependent - Hemoglobin A1c 6%. Oral hypoglycemics on hold. - Currently on insulin resistant SSI. CBGs 118-155.    Hyperkalemia / hyponatremia / hypomagnesemia / non-anion gap metabolic acidosis - Hyperkalemia, hyponatremia resolved with IV fluids. Remains slightly hypokalemic. Give 40 mEq of oral potassium today. - Magnesium replaced.    Hypocalcemia - Given 1 g of calcium gluconate 06/21/2014.    Leukocytosis / rule out sepsis - Blood cultures done 06/20/2014: Negative. - Urine cultures done 06/18/2014: Negative. - Repeat blood cultures done 07/01/14: Negative to date.    Obstructive sleep apnea / morbid obesity / hypoalbuminemia - BMI 49.07. Continue heart healthy/carbohydrate modified diet. - Dietitian evaluation done 06/26/14. - Not on C Pap. Nocturnal BiPAP started 07/01/14, however patient refuses.    Pressure  ulcer - Evaluated by wound care  nurse, continue silicone dressing over sacral area and elbow. - Continue bariatric mattress and bilateral pressure redistribution boots.    Normocytic anemia - Likely anemia of chronic disease. No current indication for transfusion.    Rule out polyarticular arthritis/inflammatory bowel disease  - ESR 130, CK low at 20. LDH normal at 130. Follow-up autoimmune evaluation ordered by ID.    DVT Prophylaxis - On chronic Xarelto.  Code Status: Full. Family Communication: Wife updated at the bedside. Disposition Plan: SNF recommended by PT. SNF when hemodynamically stable, alternative blood thinning regimen defined, likely several more days.   IV Access:    Peripheral IV   Procedures and diagnostic studies:   Dg Forearm Left  07/02/2014   CLINICAL DATA:  Fall last week with right wrist and forearm pain. Pain with supination.  EXAM: LEFT FOREARM - 2 VIEW  COMPARISON:  None.  FINDINGS: There are mild degenerative changes over the elbow and wrist as well as first carpometacarpal joint. No acute fracture or dislocation.  IMPRESSION: No acute findings.   Electronically Signed   By: Marin Olp M.D.   On: 07/02/2014 10:56   Ct Chest W Contrast  07/04/2014   CLINICAL DATA:  Diarrhea, fever  EXAM: CT CHEST, ABDOMEN, AND PELVIS WITH CONTRAST  TECHNIQUE: Multidetector CT imaging of the chest, abdomen and pelvis was performed following the standard protocol during bolus administration of intravenous contrast.  CONTRAST:  78mL OMNIPAQUE IOHEXOL 300 MG/ML SOLN, 168mL OMNIPAQUE IOHEXOL 300 MG/ML SOLN  COMPARISON:  None.  FINDINGS: CT CHEST FINDINGS  Mediastinum/Nodes: Great vessels are normal in caliber. Pulmonary arterial dilation measuring 4.2 cm in transverse diameter image 26 series 2 is identified. Although this examination is not optimized for detection of pulmonary embolism, there is filling defect compatible with clot within the main branches of the right main  pulmonary artery. Heart size is mildly enlarged. Small pericardial effusion. No lymphadenopathy. Mild atheromatous aortic calcification. RV: LV ratio is 1.3. This is abnormally elevated.  Lungs/Pleura: Small pleural effusions are noted with adjacent compressive atelectasis.  Upper abdomen: Please see detailed report below.  Musculoskeletal: No acute osseus abnormality.  CT ABDOMEN AND PELVIS FINDINGS  Lower chest:  Please see detailed report above.  Hepatobiliary: Liver is grossly unremarkable. Mild gallbladder distention noted with possible dependent stones or sludge.  Pancreas: Fatty infiltrated, normal  Spleen: Normal  Adrenals/Urinary Tract: Adrenal glands are normal. Nonobstructing bilateral renal calculi are identified, largest measuring 6 mm at the right mid kidney. No hydroureteronephrosis or radiopaque ureteral or bladder calculus. A Foley catheter is in place and the bladder is decompressed.  Stomach/Bowel: The sigmoid colon is featureless with mild wall thickening and surrounding pericolonic stranding, for example image 106. No surrounding rim enhancing fluid collection to suggest abscess. No bowel dilatation. Normal appendix. Small bowel is normal.  Vascular/Lymphatic: No aortic aneurysm or lymphadenopathy.  Other: Small fat containing umbilical hernia.  No free fluid or air.  Musculoskeletal: Extensive multilevel disc degenerative change is present, most prominent in the lumbar spine.  IMPRESSION: Cardiomegaly, pulmonary arterial dilation, and evidence of right heart strain secondary to incidentally detected large right main pulmonary arterial embolism. Additional emboli could be present but these examination was not tailored for specific depiction of pulmonary arterial enhancement.  Long segmental sigmoid colonic wall thickening with surrounding stranding which may indicate colitis which could be infectious, inflammatory, or infiltrative. Ischemia could also appear similar.  These results were called  by telephone at the time of interpretation on 07/04/2014 at 4:51  pm to Dr. Jacquelynn Cree , who verbally acknowledged these results.   Electronically Signed   By: Conchita Paris M.D.   On: 07/04/2014 16:56   Ct Abdomen Pelvis W Contrast  07/04/2014   CLINICAL DATA:  Diarrhea, fever  EXAM: CT CHEST, ABDOMEN, AND PELVIS WITH CONTRAST  TECHNIQUE: Multidetector CT imaging of the chest, abdomen and pelvis was performed following the standard protocol during bolus administration of intravenous contrast.  CONTRAST:  20mL OMNIPAQUE IOHEXOL 300 MG/ML SOLN, 137mL OMNIPAQUE IOHEXOL 300 MG/ML SOLN  COMPARISON:  None.  FINDINGS: CT CHEST FINDINGS  Mediastinum/Nodes: Great vessels are normal in caliber. Pulmonary arterial dilation measuring 4.2 cm in transverse diameter image 26 series 2 is identified. Although this examination is not optimized for detection of pulmonary embolism, there is filling defect compatible with clot within the main branches of the right main pulmonary artery. Heart size is mildly enlarged. Small pericardial effusion. No lymphadenopathy. Mild atheromatous aortic calcification. RV: LV ratio is 1.3. This is abnormally elevated.  Lungs/Pleura: Small pleural effusions are noted with adjacent compressive atelectasis.  Upper abdomen: Please see detailed report below.  Musculoskeletal: No acute osseus abnormality.  CT ABDOMEN AND PELVIS FINDINGS  Lower chest:  Please see detailed report above.  Hepatobiliary: Liver is grossly unremarkable. Mild gallbladder distention noted with possible dependent stones or sludge.  Pancreas: Fatty infiltrated, normal  Spleen: Normal  Adrenals/Urinary Tract: Adrenal glands are normal. Nonobstructing bilateral renal calculi are identified, largest measuring 6 mm at the right mid kidney. No hydroureteronephrosis or radiopaque ureteral or bladder calculus. A Foley catheter is in place and the bladder is decompressed.  Stomach/Bowel: The sigmoid colon is featureless with mild wall  thickening and surrounding pericolonic stranding, for example image 106. No surrounding rim enhancing fluid collection to suggest abscess. No bowel dilatation. Normal appendix. Small bowel is normal.  Vascular/Lymphatic: No aortic aneurysm or lymphadenopathy.  Other: Small fat containing umbilical hernia.  No free fluid or air.  Musculoskeletal: Extensive multilevel disc degenerative change is present, most prominent in the lumbar spine.  IMPRESSION: Cardiomegaly, pulmonary arterial dilation, and evidence of right heart strain secondary to incidentally detected large right main pulmonary arterial embolism. Additional emboli could be present but these examination was not tailored for specific depiction of pulmonary arterial enhancement.  Long segmental sigmoid colonic wall thickening with surrounding stranding which may indicate colitis which could be infectious, inflammatory, or infiltrative. Ischemia could also appear similar.  These results were called by telephone at the time of interpretation on 07/04/2014 at 4:51 pm to Dr. Jacquelynn Cree , who verbally acknowledged these results.   Electronically Signed   By: Conchita Paris M.D.   On: 07/04/2014 16:56   Dg Chest Port 1 View  07/01/2014   CLINICAL DATA:  Shortness of breath.  Weakness.  EXAM: PORTABLE CHEST - 1 VIEW  COMPARISON:  06/27/2014 and 07/14/2014 and 04/14/2005  FINDINGS: There is new pulmonary vascular congestion without pulmonary edema. Chronic cardiomegaly. Chronic tortuosity and calcification of the thoracic aorta. No acute osseous abnormality.  IMPRESSION: Pulmonary vascular congestion.  Chronic cardiomegaly.   Electronically Signed   By: Lorriane Shire M.D.   On: 07/01/2014 18:03   Dg Chest Port 1 View  06/27/2014   CLINICAL DATA:  Shortness of breath with diarrhea intermittently for 2 days. History of diabetes and obesity. Initial encounter.  EXAM: PORTABLE CHEST - 1 VIEW  COMPARISON:  04/14/2005 and 07/11/2014 radiographs.  FINDINGS: 1035  hours. Stable cardiomegaly and mild chronic vascular congestion. No  edema, confluent airspace opacity or pleural effusion. The bones appear unchanged. Telemetry leads overlie the chest.  IMPRESSION: Stable chest with mild cardiomegaly.  No acute findings demonstrated   Electronically Signed   By: Richardean Sale M.D.   On: 06/27/2014 12:21   Dg Chest Port 1 View  07/13/2014   CLINICAL DATA:  Short of breath.  Diarrhea.  Cough.  EXAM: PORTABLE CHEST - 1 VIEW  COMPARISON:  04/14/2005.  FINDINGS: Chronic cardiomegaly. No airspace disease or pleural effusion. Monitoring leads project over the chest. Basilar atelectasis.  IMPRESSION: Chronic cardiomegaly without acute cardiopulmonary disease.   Electronically Signed   By: Dereck Ligas M.D.   On: 07/01/2014 15:29   Dg Abd Portable 1v  06/27/2014   CLINICAL DATA:  Shortness of breath with intermittent diarrhea for 2 days. History of hypertension and obesity. Initial encounter.  EXAM: PORTABLE ABDOMEN - 1 VIEW  COMPARISON:  None.  FINDINGS: Examination is limited by body habitus and breathing artifact. The visualized bowel gas pattern is nonobstructive. There is no supine evidence of free intraperitoneal air or bowel wall thickening. There are no suspicious abdominal calcifications. Degenerative changes are present throughout the visualized spine.  IMPRESSION: No acute findings or explanation for the patient's symptoms. Examination is limited by body habitus and breathing artifact.   Electronically Signed   By: Richardean Sale M.D.   On: 06/27/2014 12:23     Medical Consultants:    ID  PCCM  IR  Anti-Infectives:    Vancomycin 06/18/2014---> 06/26/14, restarted 07/01/14---> 07/03/14  Zosyn 06/24/2014---> 06/26/14, restarted 07/01/14---> 07/03/14  Flagyl 07/10/2014---> 06/29/14  Subjective:   John Paul is sleepy this morning, didn't sleep well last night.  Wife reports his appetite is picking up.  Denies dyspnea.  Has had some abdominal pain.  No reports of  diarrhea.  Objective:    Filed Vitals:   07/06/14 0200 07/06/14 0332 07/06/14 0400 07/06/14 0600  BP: 95/49  100/37 96/41  Pulse: 103  99 92  Temp:  98.1 F (36.7 C)    TempSrc:  Oral    Resp: $Remo'25  26 22  'jjBJG$ Height:      Weight:  166.017 kg (366 lb)    SpO2: 97%  95% 93%    Intake/Output Summary (Last 24 hours) at 07/06/14 0703 Last data filed at 07/06/14 0607  Gross per 24 hour  Intake 1736.33 ml  Output    900 ml  Net 836.33 ml    Exam: Gen:  NAD, irritable/sleepy Cardiovascular:  RRR, no M/R/G Respiratory:  Lungs diminished Gastrointestinal:  Abdomen soft, obese, NT, +BS Extremities:  3+ edema with venous stasis changes  Data Reviewed:    Labs: Basic Metabolic Panel:  Recent Labs Lab 07/01/14 0335 07/01/14 1000 07/02/14 0800 07/05/14 0018 07/05/14 0510 07/06/14 0345  NA 135  --  138 136  --  138  K 4.3  --  3.9 3.2*  --  3.4*  CL 106  --  103 101  --  102  CO2 22  --  24 28  --  28  GLUCOSE 143*  --  142* 125*  --  152*  BUN 15  --  17 17  --  14  CREATININE 0.88  --  0.93 0.90  --  0.76  CALCIUM 7.3*  --  7.4* 7.1*  --  7.7*  MG 1.5* 1.4* 1.4*  --  1.3* 1.8   GFR Estimated Creatinine Clearance: 132 mL/min (by C-G formula based on Cr of  0.76). Liver Function Tests:  Recent Labs Lab 07/02/14 0800 07/05/14 0018  AST 17 27  ALT 11* 13*  ALKPHOS 70 64  BILITOT 0.4 0.5  PROT 5.2* 5.3*  ALBUMIN 1.9* 1.8*   CBC:  Recent Labs Lab 07/01/14 1000 07/02/14 0800 07/05/14 0500 07/06/14 0345  WBC 14.7* 12.5* 7.6 7.6  NEUTROABS  --   --  5.2  --   HGB 11.8* 12.7* 12.5* 10.3*  HCT 36.2* 38.7* 39.0 32.2*  MCV 94.3 94.9 95.6 97.0  PLT 327 349 333 402*   CBG:  Recent Labs Lab 07/04/14 2148 07/05/14 0802 07/05/14 1221 07/05/14 1622 07/05/14 2131  GLUCAP 123* 124* 155* 127* 118*   Sepsis Labs:  Recent Labs Lab 07/01/14 1000 07/01/14 1737 07/01/14 1805 07/01/14 1909 07/02/14 0800 07/05/14 0500 07/06/14 0345  PROCALCITON  --  0.37   --   --  0.31  --   --   WBC 14.7*  --   --   --  12.5* 7.6 7.6  LATICACIDVEN  --   --  1.6 1.6  --   --   --    Microbiology Recent Results (from the past 240 hour(s))  Culture, Urine     Status: None   Collection Time: 06/27/14 11:51 AM  Result Value Ref Range Status   Specimen Description URINE, CATHETERIZED  Final   Special Requests NONE  Final   Colony Count NO GROWTH Performed at [facility]   Final   Culture NO GROWTH Performed at [facility]   Final   Report Status 06/28/2014 FINAL  Final  Culture, blood (routine x 2)     Status: None   Collection Time: 06/27/14 12:47 PM  Result Value Ref Range Status   Specimen Description BLOOD LEFT ARM  Final   Special Requests BOTTLES DRAWN AEROBIC ONLY 10CC AEROBIC ONLY  Final   Culture   Final    NO GROWTH 5 DAYS Performed at [facility]    Report Status 07/03/2014 FINAL  Final  Culture, blood (routine x 2)     Status: None   Collection Time: 06/27/14 12:54 PM  Result Value Ref Range Status   Specimen Description BLOOD LEFT ARM  Final   Special Requests   Final    BOTTLES DRAWN AEROBIC AND ANAEROBIC 10CC BOTH BOTTLES   Culture   Final    NO GROWTH 5 DAYS Performed at [facility]    Report Status 07/03/2014 FINAL  Final  Clostridium Difficile by PCR (not at [facility])     Status: None   Collection Time: 06/27/14  6:00 PM  Result Value Ref Range Status   C difficile by pcr NEGATIVE NEGATIVE Final  Culture, blood (x 2)     Status: None (Preliminary result)   Collection Time: 07/01/14  7:05 PM  Result Value Ref Range Status   Specimen Description BLOOD LEFT HAND  Final   Special Requests BOTTLES DRAWN AEROBIC ONLY [place]  Final   Culture   Final    NO GROWTH 3 DAYS Performed at [facility]    Report Status PENDING  Incomplete  Culture, blood (x 2)     Status: None (Preliminary result)   Collection Time: 07/01/14  7:20 PM  Result Value Ref Range Status   Specimen Description BLOOD  LEFT ARM  Final   Special Requests BOTTLES DRAWN AEROBIC AND ANAEROBIC 10CC  Final   Culture   Final    NO GROWTH 3 DAYS  Performed at [facility]    Report Status PENDING  Incomplete     Medications:   . antiseptic oral rinse  7 mL Mouth Rinse q12n4p  . atenolol  100 mg Oral Daily  . atorvastatin  10 mg Oral QHS  . chlorhexidine  15 mL Mouth Rinse BID  . feeding supplement (PRO-STAT 64)  30 mL Oral TID WC  . gabapentin  100 mg Oral BID  . insulin aspart  0-20 Units Subcutaneous TID WC  . insulin aspart  0-5 Units Subcutaneous QHS  . nystatin  5 mL Oral QID  . nystatin   Topical TID   Continuous Infusions: . sodium chloride Stopped (06/29/14 0806)  . sodium chloride 50 mL/hr at 07/05/14 1744  . diltiazem (CARDIZEM) infusion Stopped (06/29/14 0930)  . heparin 1,750 Units/hr (07/06/14 8850)    Time spent: 35 minutes.  The patient is medically complex and requires high complexity decision making.    LOS: 11 days   [facility] Pager [phone]. If unable to reach me by pager, please call my cell phone at [phone].  *Please refer to [email or web address], password [email or web address] to get updated schedule on who will round on this patient, as hospitalists switch teams weekly. If 7PM-7AM, please contact night-coverage at [email or web address], password [email or web address] for any overnight needs.  07/06/2014, 7:03 AM

## 2014-07-06 NOTE — Progress Notes (Signed)
ANTICOAGULATION CONSULT NOTE -Follow Up Consult  Pharmacy Consult for heparin Indication: new  pulmonary embolus  No Known Allergies  Patient Measurements: Height: 5\' 10"  (177.8 cm) Weight: (!) 366 lb (166.017 kg) (Pt. refuse to remove the 8 pillows when weighed ) IBW/kg (Calculated) : 73 Heparin Dosing Weight: 111 kg  Vital Signs: Temp: 99 F (37.2 C) (06/20 0800) Temp Source: Axillary (06/20 0800) BP: 96/41 mmHg (06/20 0600) Pulse Rate: 92 (06/20 0600)  Labs:  Recent Labs  07/04/14 1815  07/05/14 0018 07/05/14 0500 07/05/14 0515 07/05/14 1528 07/06/14 0345  HGB  --   --   --  12.5*  --   --  10.3*  HCT  --   --   --  39.0  --   --  32.2*  PLT  --   --   --  333  --   --  402*  APTT  --   < >  --  46*  --  89* 76*  HEPARINUNFRC  --   < >  --   --  >2.20* >2.20* >2.20*  CREATININE  --   --  0.90  --   --   --  0.76  CKTOTAL  --   --   --  20*  --   --   --   TROPONINI <0.03  --   --   --   --   --   --   < > = values in this interval not displayed.  Estimated Creatinine Clearance: 132 mL/min (by C-G formula based on Cr of 0.76).   Medical History: Past Medical History  Diagnosis Date  . Diabetes mellitus   . Hypertension   . Hyperlipidemia   . Morbid obesity     WITH HYPOVENTILATION  . OSA (obstructive sleep apnea)   . Atrial fibrillation   . Acute pulmonary embolism with right heart strain 07/05/2014   Assessment: 73 yoM previously on xarelto 20mg  daily for Afib. Chest CT performed 6/18 positive for R PE with R heart strain.  Started on IV heparin per pharmacy for new PE.  Today, 07/06/2014:  Heparin level > 2.2 (falsely elevated d/t Xarelto effect)  aPTT therapeutic on 1750 units/hr  Hgb decreased today, but no bleeding or line issues per nursing.  Plt wnl  CrCl wnl   Goal of Therapy:  Heparin level 0.3-0.7 units/ml; aPTT 66-102 secs Monitor platelets by anticoagulation protocol: Yes   Plan:  Continue IV heparin at 1750 units/hr (aPTT  therapeutic) DHL, continue daily aPTT until HL normalizes off Xarelto Monitor closely for s/s bleeding  Juliette Alcide, PharmD, BCPS.   Pager: [phone] 07/06/2014,10:04 AM

## 2014-07-06 NOTE — Progress Notes (Signed)
07/06/2014 RN and Nurse tech offer to turn patient 0900, but patient refuse to turn. RN explain to wife and patient if patient does not turn he can develop a pressure ulcer. Lovie Macadamia RN

## 2014-07-06 NOTE — Progress Notes (Signed)
Pt refused Bipap for tonight. Pt's wife states he has not worn Bipap/CPAP in about 5 years. Pt is unable to tolerate Bipap/CPAP due to claustrophobia. Pt remains on 3Lpm nasal cannula SpO2 96%. RT will continue to monitor as needed.

## 2014-07-06 NOTE — Progress Notes (Signed)
Pt refuses CPAP  RT to monitor and assess as needed.  

## 2014-07-06 NOTE — Progress Notes (Signed)
07/06/2014 Physical therapy came by at 1535 and assist Rn in turning patient. Patient have a foam dressing of upper back he had a skin tag. Right cheek buttock have a dark bruise area. Sacrum stage 2 with drainage. Under the abdomen excoriation. Left elbow skin tear with form dressing and bruising on bilateral arms. Bilateral legs dry, flaky and discolored and heels dry. [facility] RN.

## 2014-07-06 NOTE — Progress Notes (Signed)
Physical Therapy Treatment Patient Details Name: John Paul MRN: [id number] DOB: 27-Feb-1940 Today's Date: July 15, 2014    History of Present Illness pt was admitted for dehydration:  had nausea, diarrhea, and hpotension.  PMH significant for DM, morbid obesity and A Fib;  NEw PE 07/04/14    PT Comments    Limited session for bed mobility to assist RN with skin assessment.  Ltd based on Heparin level elevated <2.2.  Follow Up Recommendations  SNF     Equipment Recommendations  None recommended by PT    Recommendations for Other Services OT consult     Precautions / Restrictions Precautions Precautions: Fall Precaution Comments: pt has pain in bil LEs and LUE Restrictions Weight Bearing Restrictions: No    Mobility  Bed Mobility Overal bed mobility: Needs Assistance;+2 for physical assistance Bed Mobility: Rolling Rolling: +2 for physical assistance;Max assist         General bed mobility comments: +3 assist to roll to side assiting RN with skin assessment.  +3 assist to move up in bed with pt doing min to self assist.  Transfers                    Ambulation/Gait                 Stairs            Wheelchair Mobility    Modified Rankin (Stroke Patients Only)       Balance                                    Cognition Arousal/Alertness: Awake/alert Behavior During Therapy: Flat affect Overall Cognitive Status: Within Functional Limits for tasks assessed                      Exercises      General Comments        Pertinent Vitals/Pain Pain Assessment: Faces Faces Pain Scale: Hurts even more Pain Location: all extremeties Pain Descriptors / Indicators: Grimacing Pain Intervention(s): Limited activity within patient's tolerance;Monitored during session    Home Living                      Prior Function            PT Goals (current goals can now be found in the care plan section) Acute  Rehab PT Goals PT Goal Formulation: With patient Time For Goal Achievement: 07/15/14 Potential to Achieve Goals: Fair Progress towards PT goals: Not progressing toward goals - comment (Ltd session 2* heparin elevated <2.2)    Frequency  Min 2X/week    PT Plan Frequency needs to be updated    Co-evaluation             End of Session           Time: 1440-1433 PT Time Calculation (min) (ACUTE ONLY): 1433 min  Charges:  $Therapeutic Activity: 8-22 mins                    G Codes:      John Paul 15-Jul-2014, 4:58 PM

## 2014-07-06 NOTE — Progress Notes (Signed)
VASCULAR LAB PRELIMINARY  ARTERIAL  ABI completed:    RIGHT    LEFT    PRESSURE WAVEFORM  PRESSURE WAVEFORM  BRACHIAL 116 Triphasic BRACHIAL    DP  Triphasic DP    AT   AT    PT  Triphasic PT    PER   PER    GREAT TOE 108 NA GREAT TOE  NA    RIGHT LEFT  ABI Unable to calculate Unable to assess   Study was technically limited and difficult due to poor patient cooperation. Patient refused to have pressures taken of the right dorsalis pedis and posterior tibial arteries. I was unable to assess the left foot and ankle due to prevalon boot, when I asked the patient if I could move his foot and the boot he refused, therefore the left side was not evaluated.  Right great toe pressure is suggestive of adequate perfusion for healing.  07/06/2014 2:35 PM Gertie Fey, RVT, RDCS, RDMS

## 2014-07-06 NOTE — Progress Notes (Signed)
Date:  July 06, 2014 U.R. performed for needs and level of care. [id number]/large P.E. Found on ct scan on [id number], o2 and iv heparin started. Will continue to follow for Case Management needs.  Marcelle Smiling, RN, BSN, [place]   [phone]

## 2014-07-07 DIAGNOSIS — R410 Disorientation, unspecified: Secondary | ICD-10-CM | POA: Diagnosis not present

## 2014-07-07 LAB — CBC
HEMATOCRIT: 34.2 % — AB (ref 39.0–52.0)
HEMOGLOBIN: 10.9 g/dL — AB (ref 13.0–17.0)
MCH: 30.7 pg (ref 26.0–34.0)
MCHC: 31.9 g/dL (ref 30.0–36.0)
MCV: 96.3 fL (ref 78.0–100.0)
Platelets: 349 10*3/uL (ref 150–400)
RBC: 3.55 MIL/uL — ABNORMAL LOW (ref 4.22–5.81)
RDW: 13.7 % (ref 11.5–15.5)
WBC: 8.8 10*3/uL (ref 4.0–10.5)

## 2014-07-07 LAB — PROTEIN ELECTROPHORESIS, SERUM
A/G Ratio: 0.6 — ABNORMAL LOW (ref 0.7–1.7)
Albumin ELP: 1.5 g/dL — ABNORMAL LOW (ref 2.9–4.4)
Alpha-1-Globulin: 0.4 g/dL (ref 0.0–0.4)
Alpha-2-Globulin: 0.8 g/dL (ref 0.4–1.0)
BETA GLOBULIN: 0.9 g/dL (ref 0.7–1.3)
GAMMA GLOBULIN: 0.5 g/dL (ref 0.4–1.8)
Globulin, Total: 2.6 g/dL (ref 2.2–3.9)
Total Protein ELP: 4.1 g/dL — ABNORMAL LOW (ref 6.0–8.5)

## 2014-07-07 LAB — GLUCOSE, CAPILLARY
GLUCOSE-CAPILLARY: 108 mg/dL — AB (ref 65–99)
Glucose-Capillary: 122 mg/dL — ABNORMAL HIGH (ref 65–99)
Glucose-Capillary: 182 mg/dL — ABNORMAL HIGH (ref 65–99)
Glucose-Capillary: 121 mg/dL — ABNORMAL HIGH (ref 65–99)

## 2014-07-07 LAB — CULTURE, BLOOD (ROUTINE X 2)
Culture: NO GROWTH
Culture: NO GROWTH

## 2014-07-07 LAB — BASIC METABOLIC PANEL
Anion gap: 9 (ref 5–15)
BUN: 14 mg/dL (ref 6–20)
CHLORIDE: 104 mmol/L (ref 101–111)
CO2: 28 mmol/L (ref 22–32)
CREATININE: 0.79 mg/dL (ref 0.61–1.24)
Calcium: 7.8 mg/dL — ABNORMAL LOW (ref 8.9–10.3)
GFR calc Af Amer: >60 mL/min (ref >60)
GFR calc non Af Amer: >60 mL/min (ref >60)
GLUCOSE: 121 mg/dL — AB (ref 65–99)
Potassium: 4.2 mmol/L (ref 3.5–5.1)
Sodium: 141 mmol/L (ref 135–145)

## 2014-07-07 LAB — S CEREVISIAE ABS
S CEREVISIAE IGA AB: 122.8 U — AB (ref <=20.0)
S cerevisiae IgG ab: 49.5 U — ABNORMAL HIGH (ref <=20.0)

## 2014-07-07 LAB — HEPARIN LEVEL (UNFRACTIONATED): Heparin Unfractionated: 1.86 IU/mL — ABNORMAL HIGH (ref 0.30–0.70)

## 2014-07-07 LAB — APTT: APTT: 82 s — AB (ref 24–37)

## 2014-07-07 MED ORDER — DEXTROSE 5 % IV SOLN
1.0000 g | Freq: Every day | INTRAVENOUS | Status: DC
  Administered 2014-07-07 (×2): 1 g via INTRAVENOUS
  Filled 2014-07-07 (×2): qty 10

## 2014-07-07 NOTE — Progress Notes (Signed)
ANTICOAGULATION CONSULT NOTE -Follow Up Consult  Pharmacy Consult for heparin Indication: new  pulmonary embolus  No Known Allergies  Patient Measurements: Height: 5\' 10"  (177.8 cm) Weight: (!) 361 lb (163.749 kg) IBW/kg (Calculated) : 73 Heparin Dosing Weight: 111 kg  Vital Signs: Temp: 99.5 F (37.5 C) (06/21 0800) Temp Source: Axillary (06/21 0800) BP: 106/53 mmHg (06/21 1000) Pulse Rate: 99 (06/21 1000)  Labs:  Recent Labs  07/04/14 1815  07/05/14 0018  07/05/14 0500  07/05/14 1528 07/06/14 0345 07/07/14 0340  HGB  --   --   --   < > 12.5*  --   --  10.3* 10.9*  HCT  --   --   --   --  39.0  --   --  32.2* 34.2*  PLT  --   --   --   --  333  --   --  402* 349  APTT  --   < >  --   --  46*  --  89* 76* 82*  HEPARINUNFRC  --   < >  --   --   --   < > >2.20* >2.20* 1.86*  CREATININE  --   --  0.90  --   --   --   --  0.76 0.79  CKTOTAL  --   --   --   --  20*  --   --   --   --   TROPONINI <0.03  --   --   --   --   --   --   --   --   < > = values in this interval not displayed.  Estimated Creatinine Clearance: 130.9 mL/min (by C-G formula based on Cr of 0.79).   Medical History: Past Medical History  Diagnosis Date  . Diabetes mellitus   . Hypertension   . Hyperlipidemia   . Morbid obesity     WITH HYPOVENTILATION  . OSA (obstructive sleep apnea)   . Atrial fibrillation   . Acute pulmonary embolism with right heart strain 07/05/2014   Assessment: 73 yoM previously on xarelto 20mg  daily for Afib. Chest CT performed 6/18 positive for R PE with R heart strain.  Started on IV heparin per pharmacy for new PE.  Today, 07/07/2014:  Heparin level > 1.86 (still falsely elevated d/t Xarelto effect)  aPTT therapeutic on 1750 units/hr  Hgb stable today; no bleeding or line issues per nursing.  Plt wnl  CrCl wnl   Goal of Therapy:  Heparin level 0.3-0.7 units/ml; aPTT 66-102 secs Monitor platelets by anticoagulation protocol: Yes   Plan:   Continue IV  heparin at 1750 units/hr (aPTT therapeutic)  [facility], continue daily aPTT until HL normalizes off Xarelto  Monitor closely for s/s bleeding  Bernadene Person, PharmD Pager: [phone] 07/07/2014, 10:50 AM

## 2014-07-07 NOTE — Progress Notes (Signed)
CSW following pt to assist with d/c planning needs. Met with spouse at bedside. Pt sleeping soundly. SNF bed offer provided. Pt will need [employer] authorization for SNF placement. CSW will continue to follow to offer support and assistance with d/c planning.  Werner Lean LCSW [phone]

## 2014-07-07 NOTE — Progress Notes (Signed)
ANTIBIOTIC CONSULT NOTE - INITIAL  Pharmacy Consult for Rocephin Indication: UTI  No Known Allergies  Patient Measurements: Height:  (177.8 cm) Weight: (!) 366 lb (166.017 kg) (Pt. refuse to remove the 8 pillows when weighed ) IBW/kg (Calculated) : 73   Vital Signs: Temp: 98.9 F (37.2 C) (06/20 2000) Temp Source: Oral (06/20 2000) BP: 108/68 mmHg (06/20 2000) Pulse Rate: 119 (06/20 2000) Intake/Output from previous day: 06/20 0701 - 06/21 0700 In: 870 [I.V.:870] Out: 475 [Urine:475] Intake/Output from this shift:    Labs:  Recent Labs  07/05/14 0018 07/05/14 0500 07/06/14 0345  WBC  --  7.6 7.6  HGB  --  12.5* 10.3*  PLT  --  333 402*  CREATININE 0.90  --  0.76   Estimated Creatinine Clearance: 132 mL/min (by C-G formula based on Cr of 0.76). No results for input(s): VANCOTROUGH, VANCOPEAK, VANCORANDOM, GENTTROUGH, GENTPEAK, GENTRANDOM, TOBRATROUGH, TOBRAPEAK, TOBRARND, AMIKACINPEAK, AMIKACINTROU, AMIKACIN in the last 72 hours.   Microbiology: Recent Results (from the past 720 hour(s))  Blood culture (routine x 2)     Status: None   Collection Time: 07-09-2014  3:28 PM  Result Value Ref Range Status   Specimen Description BLOOD RIGHT ARM  Final   Special Requests BOTTLES DRAWN AEROBIC AND ANAEROBIC 5CC EACH  Final   Culture   Final    NO GROWTH 5 DAYS Performed at [facility]    Report Status 07/01/2014 FINAL  Final  Blood culture (routine x 2)     Status: None   Collection Time: 07/09/2014  3:29 PM  Result Value Ref Range Status   Specimen Description BLOOD LEFT ARM  Final   Special Requests BOTTLES DRAWN AEROBIC AND ANAEROBIC 5CC EACH  Final   Culture   Final    NO GROWTH 5 DAYS Performed at [facility]    Report Status 07/01/2014 FINAL  Final  Urine culture     Status: None   Collection Time: 09-Jul-2014  5:46 PM  Result Value Ref Range Status   Specimen Description Urine  Final   Special Requests Normal  Final   Colony Count NO  GROWTH Performed at [facility]   Final   Culture NO GROWTH Performed at [facility]   Final   Report Status 06/26/2014 FINAL  Final  MRSA PCR Screening     Status: None   Collection Time: 2014/07/09 10:01 PM  Result Value Ref Range Status   MRSA by PCR NEGATIVE NEGATIVE Final    Comment:        The GeneXpert MRSA Assay (FDA approved for NASAL specimens only), is one component of a comprehensive MRSA colonization surveillance program. It is not intended to diagnose MRSA infection nor to guide or monitor treatment for MRSA infections.   Culture, Urine     Status: None   Collection Time: 06/26/14  1:45 AM  Result Value Ref Range Status   Specimen Description URINE, CATHETERIZED  Final   Special Requests NONE  Final   Colony Count NO GROWTH Performed at [facility]   Final   Culture NO GROWTH Performed at [facility]   Final   Report Status 06/27/2014 FINAL  Final  Culture, Urine     Status: None   Collection Time: 06/27/14 11:51 AM  Result Value Ref Range Status   Specimen Description URINE, CATHETERIZED  Final   Special Requests NONE  Final   Colony Count NO GROWTH Performed at [facility]   Final   Culture NO GROWTH Performed at [facility]   Final   Report Status 06/28/2014 FINAL  Final  Culture, blood (routine x 2)     Status: None   Collection Time: 06/27/14 12:47 PM  Result Value Ref Range Status   Specimen Description BLOOD LEFT ARM  Final   Special Requests BOTTLES DRAWN AEROBIC ONLY 10CC AEROBIC ONLY  Final   Culture   Final    NO GROWTH 5 DAYS Performed at [facility]    Report Status 07/03/2014 FINAL  Final  Culture, blood (routine x 2)     Status: None   Collection Time: 06/27/14 12:54 PM  Result Value Ref Range Status   Specimen Description BLOOD LEFT ARM  Final   Special Requests   Final    BOTTLES DRAWN AEROBIC AND ANAEROBIC 10CC BOTH BOTTLES   Culture   Final    NO GROWTH 5  DAYS Performed at [facility]    Report Status 07/03/2014 FINAL  Final  Clostridium Difficile by PCR (not at [facility])     Status: None   Collection Time: 06/27/14  6:00 PM  Result Value Ref Range Status   C difficile by pcr NEGATIVE NEGATIVE Final  Culture, blood (x 2)     Status: None (Preliminary result)   Collection Time: 07/01/14  7:05 PM  Result Value Ref Range Status   Specimen Description BLOOD LEFT HAND  Final   Special Requests BOTTLES DRAWN AEROBIC ONLY 6CC  Final   Culture   Final    NO GROWTH 4 DAYS Performed at [facility]    Report Status PENDING  Incomplete  Culture, blood (x 2)     Status: None (Preliminary result)   Collection Time: 07/01/14  7:20 PM  Result Value Ref Range Status   Specimen Description BLOOD LEFT ARM  Final   Special Requests BOTTLES DRAWN AEROBIC AND ANAEROBIC 10CC  Final   Culture   Final    NO GROWTH 4 DAYS Performed at [facility]    Report Status PENDING  Incomplete    Medical History: Past Medical History  Diagnosis Date  . Diabetes mellitus   . Hypertension   . Hyperlipidemia   . Morbid obesity     WITH HYPOVENTILATION  . OSA (obstructive sleep apnea)   . Atrial fibrillation   . Acute pulmonary embolism with right heart strain 07/05/2014    Medications:  Prescriptions prior to admission  Medication Sig Dispense Refill Last Dose  . Alcohol Swabs (B-D SINGLE USE SWABS REGULAR) PADS    07/05/2014 at Unknown time  . atenolol (TENORMIN) 100 MG tablet    2014-07-05 at 1030  . docusate sodium (COLACE) 100 MG capsule Take 100 mg by mouth 2 (two) times daily.   Past Week at Unknown time  . metFORMIN (GLUCOPHAGE-XR) 500 MG 24 hr tablet Take 500 mg by mouth 2 (two) times daily.   07-05-14 at Unknown time  . Multiple Vitamin (MULTIVITAMIN) tablet Take 1 tablet by mouth daily.     05-Jul-2014 at Unknown time  . OVER THE COUNTER MEDICATION 1 tablet daily as needed (Sudafed- strength unknown).   2014-07-05 at Unknown time  .  quinapril (ACCUPRIL) 20 MG tablet    07/05/2014 at Unknown time  . rivaroxaban (XARELTO) 20 MG TABS tablet Take 1 tablet (20 mg total) by mouth daily. 30 tablet 11 2014/07/05 at 1030  . simvastatin (ZOCOR) 20 MG tablet Take 20  mg by mouth at bedtime.     Past Week at Unknown time  . traMADol (ULTRAM) 50 MG tablet    07/06/2014 at Unknown time  . triamterene-hydrochlorothiazide (DYAZIDE) 37.5-25 MG per capsule Take 1 capsule by mouth every morning.     06/22/2014 at Unknown time  . TRUE METRIX BLOOD GLUCOSE TEST test strip    06/18/2014 at Unknown time  . glyBURIDE-metformin (GLUCOVANCE) 2.5-500 MG per tablet Take 1 tablet by mouth 2 (two) times daily with a meal.     Not Taking at Unknown time   Scheduled:  . antiseptic oral rinse  7 mL Mouth Rinse q12n4p  . atenolol  100 mg Oral Daily  . atorvastatin  10 mg Oral QHS  . cefTRIAXone (ROCEPHIN)  IV  1 g Intravenous QHS  . chlorhexidine  15 mL Mouth Rinse BID  . feeding supplement (PRO-STAT 64)  30 mL Oral TID WC  . gabapentin  100 mg Oral BID  . insulin aspart  0-20 Units Subcutaneous TID WC  . insulin aspart  0-5 Units Subcutaneous QHS  . nystatin  5 mL Oral QID  . nystatin   Topical TID   Infusions:  . sodium chloride Stopped (06/29/14 0806)  . sodium chloride 50 mL/hr at 07/06/14 1800  . diltiazem (CARDIZEM) infusion 5 mg/hr (07/06/14 2212)  . heparin 1,750 Units/hr (07/06/14 2015)   Assessment: 15 yoM admitted 6/9 with diarrhea, SOB, productive cough.  S/p multiple abx this admission.  Rocephin per Rx for UTI.   Goal of Therapy:  Treat UTI  Plan:   Rocephin 1Gm IV q24h  F/u cultures  Lorenza Evangelist 07/07/2014,12:08 AM

## 2014-07-07 NOTE — Progress Notes (Signed)
Progress Note   John Paul EXH:[id number] DOB: [birth year] DOA: 07/01/2014 PCP: Chesley Noon, MD   Brief Narrative:   John Paul is an 74 y.o. male with a PMH of hypertension, atrial fibrillation on chronic Xarelto, diabetes and dyslipidemia who was admitted 06/22/2014 by the critical care team with hypotension, lactic acidosis, and severe diarrhea. Pt was initially admitted to ICU service where his BP was ultimately stabilized with aggressive IVF hydration. Cdiff was neg as was fecal pathogen panel. The patient's diarrhea gradually improved. During this course, the patient encountered periods of afib with RVR requiring cardizem gtt. on 07/01/14, the patient was noted to have a wheeze with increased respiratory distress and recurrent fever. Empiric vancomycin/Zosyn started and pulmonology called in consultation. Due to ongoing fevers and hypotension with no obvious source of sepsis, ID consultation was requested. A CT of the chest, abdomen and pelvis were obtained to help identify a source of the patient's ongoing symptoms. CT of the chest unexpectedly showed a large mainstem pulmonary artery embolism (had been on Xarelto with no missed doses). CT of the abdomen showed nonspecific colitis, likely ischemic given the patient's hypotension. At this time, the patient remains on a heparin drip and critically ill in the ICU.  Assessment/Plan:   Principal Problem:   Hypovolemic shock/hypotension in the setting of severe diarrhea with lactic acidosis/severe dehydration rule out sepsis - Status post aggressive fluid volume resuscitation. - Empiric Flagyl started on admission and discontinued 06/29/14 after C. difficile studies negative. - C. difficile negative. Stool pathogen panel negative. - Given ongoing lack of improvement and concerns for occult infection, a CT of the abdomen/pelvis was obtained 07/04/14. - Suspect diarrhea related to ischemic insult to the bowel/colitis in the setting of large  pulmonary embolism.  Active Problems:   Acute respiratory distress secondary to pulmonary vascular congestion and large right mainstem pulmonary embolism/ cardiomegaly/ right heart strain / acute systolic right heart failure - On 07/01/14, the pt developed acute respiratory distress. CXR showed cardiomegaly and pulmonary congestion. Lasix given. - CT of the chest obtained 07/04/14 with an incidentally discovered large right mainstem PE. - Xarelto discontinued and the patient was started on a heparin drip. - Discussed case with PCCM and IR, no current role for clot dissolution. - 2-D echo done 07/05/14. EF 55-60 percent, no regional wall motion abnormality but systolic function reduced in the right ventricle.    Oral thrush - Improved. Continue nystatin.    Atrial fibrillation with RVR / permanent atrial fibrillation on chronic anticoagulation with Xarelto - On 06/26/14, HR increased into the 130s and EKG showed atrial flutter. Patient was started on Cardizem IV. Atenolol resumed. - Was on chronic Xarelto 20 mg daily and atenolol. Atenolol held on admission. Now on heparin given PE. - Cardiology consultation requested after patient developed hypotension necessitating holding atenolol. - Per cardiologist, no plans for inpatient cardioversion. Will need follow-up post discharge. - Suspect atrial fibrillation/flutter acutely worsened rate control secondary to large PE.  - Heart rate increased overnight, now back on Cardizem drip. Wean.    UTI - U/A obtained 07/06/14 after heart rate elevated. Urine was cloudy with positive nitrites and moderate leukocytes, many bacteria. - Empiric Rocephin started pending culture results.    Delirium  - Given Haldol overnight secondary to delirium.     Hyperlipidemia - Continue statin therapy.    Severe physical deconditioning - Seen by physical therapy. SNF recommended.    Diabetes mellitus type 2, noninsulin dependent - Hemoglobin A1c 6.3%.  Oral  hypoglycemics on hold. - Currently on insulin resistant SSI. CBGs 98-136.    Hyperkalemia / hyponatremia / hypomagnesemia / non-anion gap metabolic acidosis - Hyperkalemia, hyponatremia resolved with IV fluids. Remains slightly hypokalemic. Give 40 mEq of oral potassium today. - Magnesium replaced.    Hypocalcemia - Given 1 g of calcium gluconate 07/09/2014.    Leukocytosis / rule out sepsis - Blood cultures done 06/24/2014: Negative. - Urine cultures done 06/29/2014: Negative. - Repeat blood cultures done 07/01/14: Negative to date. - Repeat urine cultures done 07/06/14: Results pending.    Obstructive sleep apnea / morbid obesity / hypoalbuminemia - BMI 49.07. Continue heart healthy/carbohydrate modified diet. - Dietitian evaluation done 06/26/14. - Not on C Pap. Nocturnal BiPAP started 07/01/14, however patient refuses.    Pressure ulcer - Evaluated by wound care nurse, continue silicone dressing over sacral area and elbow. - Continue bariatric mattress and bilateral pressure redistribution boots.    Normocytic anemia - Likely anemia of chronic disease. No current indication for transfusion.    Rule out polyarticular arthritis/inflammatory bowel disease  - ESR 130, CK low at 20. LDH normal at 130. Follow-up autoimmune evaluation ordered by ID. - RF elevated at 14.8. The low positive predictive value of the RF casts doubt on the utility of the RF in the diagnostic evaluation of patients. Arch Intern Med. 1992 Dec;152(12):2417-20. - ANA negative. Acute hepatitis panel negative. Anti-double-stranded DNA antibody negative. - Sjogren's syndrome antibodies negative. ANCA negative. - F/U CCP, cryoglobulins, SPEP.    DVT Prophylaxis - On chronic Xarelto.  Code Status: Full. Family Communication: Wife updated at the bedside. Disposition Plan: SNF recommended by PT. SNF when hemodynamically stable, alternative blood thinning regimen defined, likely several more days. The patient remains critically  ill.   IV Access:    Peripheral IV   Procedures and diagnostic studies:   Dg Forearm Left  07/02/2014   CLINICAL DATA:  Fall last week with right wrist and forearm pain. Pain with supination.  EXAM: LEFT FOREARM - 2 VIEW  COMPARISON:  None.  FINDINGS: There are mild degenerative changes over the elbow and wrist as well as first carpometacarpal joint. No acute fracture or dislocation.  IMPRESSION: No acute findings.   Electronically Signed   By: Marin Olp M.D.   On: 07/02/2014 10:56   Ct Chest W Contrast  07/04/2014   CLINICAL DATA:  Diarrhea, fever  EXAM: CT CHEST, ABDOMEN, AND PELVIS WITH CONTRAST  TECHNIQUE: Multidetector CT imaging of the chest, abdomen and pelvis was performed following the standard protocol during bolus administration of intravenous contrast.  CONTRAST:  33mL OMNIPAQUE IOHEXOL 300 MG/ML SOLN, 171mL OMNIPAQUE IOHEXOL 300 MG/ML SOLN  COMPARISON:  None.  FINDINGS: CT CHEST FINDINGS  Mediastinum/Nodes: Great vessels are normal in caliber. Pulmonary arterial dilation measuring 4.2 cm in transverse diameter image 26 series 2 is identified. Although this examination is not optimized for detection of pulmonary embolism, there is filling defect compatible with clot within the main branches of the right main pulmonary artery. Heart size is mildly enlarged. Small pericardial effusion. No lymphadenopathy. Mild atheromatous aortic calcification. RV: LV ratio is 1.3. This is abnormally elevated.  Lungs/Pleura: Small pleural effusions are noted with adjacent compressive atelectasis.  Upper abdomen: Please see detailed report below.  Musculoskeletal: No acute osseus abnormality.  CT ABDOMEN AND PELVIS FINDINGS  Lower chest:  Please see detailed report above.  Hepatobiliary: Liver is grossly unremarkable. Mild gallbladder distention noted with possible dependent stones or sludge.  Pancreas: Fatty  infiltrated, normal  Spleen: Normal  Adrenals/Urinary Tract: Adrenal glands are normal.  Nonobstructing bilateral renal calculi are identified, largest measuring 6 mm at the right mid kidney. No hydroureteronephrosis or radiopaque ureteral or bladder calculus. A Foley catheter is in place and the bladder is decompressed.  Stomach/Bowel: The sigmoid colon is featureless with mild wall thickening and surrounding pericolonic stranding, for example image 106. No surrounding rim enhancing fluid collection to suggest abscess. No bowel dilatation. Normal appendix. Small bowel is normal.  Vascular/Lymphatic: No aortic aneurysm or lymphadenopathy.  Other: Small fat containing umbilical hernia.  No free fluid or air.  Musculoskeletal: Extensive multilevel disc degenerative change is present, most prominent in the lumbar spine.  IMPRESSION: Cardiomegaly, pulmonary arterial dilation, and evidence of right heart strain secondary to incidentally detected large right main pulmonary arterial embolism. Additional emboli could be present but these examination was not tailored for specific depiction of pulmonary arterial enhancement.  Long segmental sigmoid colonic wall thickening with surrounding stranding which may indicate colitis which could be infectious, inflammatory, or infiltrative. Ischemia could also appear similar.  These results were called by telephone at the time of interpretation on 07/04/2014 at 4:51 pm to Dr. Jacquelynn Cree , who verbally acknowledged these results.   Electronically Signed   By: Conchita Paris M.D.   On: 07/04/2014 16:56   Ct Abdomen Pelvis W Contrast  07/04/2014   CLINICAL DATA:  Diarrhea, fever  EXAM: CT CHEST, ABDOMEN, AND PELVIS WITH CONTRAST  TECHNIQUE: Multidetector CT imaging of the chest, abdomen and pelvis was performed following the standard protocol during bolus administration of intravenous contrast.  CONTRAST:  18mL OMNIPAQUE IOHEXOL 300 MG/ML SOLN, 112mL OMNIPAQUE IOHEXOL 300 MG/ML SOLN  COMPARISON:  None.  FINDINGS: CT CHEST FINDINGS  Mediastinum/Nodes: Great vessels are  normal in caliber. Pulmonary arterial dilation measuring 4.2 cm in transverse diameter image 26 series 2 is identified. Although this examination is not optimized for detection of pulmonary embolism, there is filling defect compatible with clot within the main branches of the right main pulmonary artery. Heart size is mildly enlarged. Small pericardial effusion. No lymphadenopathy. Mild atheromatous aortic calcification. RV: LV ratio is 1.3. This is abnormally elevated.  Lungs/Pleura: Small pleural effusions are noted with adjacent compressive atelectasis.  Upper abdomen: Please see detailed report below.  Musculoskeletal: No acute osseus abnormality.  CT ABDOMEN AND PELVIS FINDINGS  Lower chest:  Please see detailed report above.  Hepatobiliary: Liver is grossly unremarkable. Mild gallbladder distention noted with possible dependent stones or sludge.  Pancreas: Fatty infiltrated, normal  Spleen: Normal  Adrenals/Urinary Tract: Adrenal glands are normal. Nonobstructing bilateral renal calculi are identified, largest measuring 6 mm at the right mid kidney. No hydroureteronephrosis or radiopaque ureteral or bladder calculus. A Foley catheter is in place and the bladder is decompressed.  Stomach/Bowel: The sigmoid colon is featureless with mild wall thickening and surrounding pericolonic stranding, for example image 106. No surrounding rim enhancing fluid collection to suggest abscess. No bowel dilatation. Normal appendix. Small bowel is normal.  Vascular/Lymphatic: No aortic aneurysm or lymphadenopathy.  Other: Small fat containing umbilical hernia.  No free fluid or air.  Musculoskeletal: Extensive multilevel disc degenerative change is present, most prominent in the lumbar spine.  IMPRESSION: Cardiomegaly, pulmonary arterial dilation, and evidence of right heart strain secondary to incidentally detected large right main pulmonary arterial embolism. Additional emboli could be present but these examination was not  tailored for specific depiction of pulmonary arterial enhancement.  Long segmental sigmoid colonic wall thickening  with surrounding stranding which may indicate colitis which could be infectious, inflammatory, or infiltrative. Ischemia could also appear similar.  These results were called by telephone at the time of interpretation on 07/04/2014 at 4:51 pm to Dr. Jacquelynn Cree , who verbally acknowledged these results.   Electronically Signed   By: Conchita Paris M.D.   On: 07/04/2014 16:56   Dg Chest Port 1 View  07/01/2014   CLINICAL DATA:  Shortness of breath.  Weakness.  EXAM: PORTABLE CHEST - 1 VIEW  COMPARISON:  06/27/2014 and 07/13/2014 and 04/14/2005  FINDINGS: There is new pulmonary vascular congestion without pulmonary edema. Chronic cardiomegaly. Chronic tortuosity and calcification of the thoracic aorta. No acute osseous abnormality.  IMPRESSION: Pulmonary vascular congestion.  Chronic cardiomegaly.   Electronically Signed   By: Lorriane Shire M.D.   On: 07/01/2014 18:03   Dg Chest Port 1 View  06/27/2014   CLINICAL DATA:  Shortness of breath with diarrhea intermittently for 2 days. History of diabetes and obesity. Initial encounter.  EXAM: PORTABLE CHEST - 1 VIEW  COMPARISON:  04/14/2005 and 07/15/2014 radiographs.  FINDINGS: 1035 hours. Stable cardiomegaly and mild chronic vascular congestion. No edema, confluent airspace opacity or pleural effusion. The bones appear unchanged. Telemetry leads overlie the chest.  IMPRESSION: Stable chest with mild cardiomegaly.  No acute findings demonstrated   Electronically Signed   By: Richardean Sale M.D.   On: 06/27/2014 12:21   Dg Chest Port 1 View  06/18/2014   CLINICAL DATA:  Short of breath.  Diarrhea.  Cough.  EXAM: PORTABLE CHEST - 1 VIEW  COMPARISON:  04/14/2005.  FINDINGS: Chronic cardiomegaly. No airspace disease or pleural effusion. Monitoring leads project over the chest. Basilar atelectasis.  IMPRESSION: Chronic cardiomegaly without acute  cardiopulmonary disease.   Electronically Signed   By: Dereck Ligas M.D.   On: 06/26/2014 15:29   Dg Abd Portable 1v  06/27/2014   CLINICAL DATA:  Shortness of breath with intermittent diarrhea for 2 days. History of hypertension and obesity. Initial encounter.  EXAM: PORTABLE ABDOMEN - 1 VIEW  COMPARISON:  None.  FINDINGS: Examination is limited by body habitus and breathing artifact. The visualized bowel gas pattern is nonobstructive. There is no supine evidence of free intraperitoneal air or bowel wall thickening. There are no suspicious abdominal calcifications. Degenerative changes are present throughout the visualized spine.  IMPRESSION: No acute findings or explanation for the patient's symptoms. Examination is limited by body habitus and breathing artifact.   Electronically Signed   By: Richardean Sale M.D.   On: 06/27/2014 12:23     Medical Consultants:    ID  PCCM  IR  Anti-Infectives:    Vancomycin 07/11/2014---> 06/26/14, restarted 07/01/14---> 07/03/14  Zosyn 07/11/2014---> 06/26/14, restarted 07/01/14---> 07/03/14  Flagyl 06/17/2014---> 06/29/14  Rocephin 07/07/14--->  Subjective:   John Paul is a bit more lethargic this morning. Wife reports that he ate well yesterday. Was confused and disoriented in the night and was given Haldol to address this. Also developed recurrent tachycardia assess resulting in institution of a Cardizem drip. Patient currently is resting comfortably, and is minimally verbal at this time.  Objective:    Filed Vitals:   07/07/14 0200 07/07/14 0300 07/07/14 0400 07/07/14 0432  BP: 104/57 101/57 102/67   Pulse: 114 108 109   Temp:   99 F (37.2 C)   TempSrc:      Resp: $Remo'22 22 24   'xFpiF$ Height:      Weight:    163.749  kg (361 lb)  SpO2: 94% 93% 93%     Intake/Output Summary (Last 24 hours) at 07/07/14 0716 Last data filed at 07/07/14 0417  Gross per 24 hour  Intake   1410 ml  Output    770 ml  Net    640 ml    Exam: Gen:  NAD,  lethargic Cardiovascular:  RRR, no M/R/G Respiratory:  Lungs diminished Gastrointestinal:  Abdomen soft, obese, NT, +BS Extremities:  3+ edema with venous stasis changes  Data Reviewed:    Labs: Basic Metabolic Panel:  Recent Labs Lab 07/01/14 0335 07/01/14 1000 07/02/14 0800 07/05/14 0018 07/05/14 0510 07/06/14 0345 07/07/14 0340  NA 135  --  138 136  --  138 141  K 4.3  --  3.9 3.2*  --  3.4* 4.2  CL 106  --  103 101  --  102 104  CO2 22  --  24 28  --  28 28  GLUCOSE 143*  --  142* 125*  --  152* 121*  BUN 15  --  17 17  --  14 14  CREATININE 0.88  --  0.93 0.90  --  0.76 0.79  CALCIUM 7.3*  --  7.4* 7.1*  --  7.7* 7.8*  MG 1.5* 1.4* 1.4*  --  1.3* 1.8  --    GFR Estimated Creatinine Clearance: 130.9 mL/min (by C-G formula based on Cr of 0.79). Liver Function Tests:  Recent Labs Lab 07/02/14 0800 07/05/14 0018  AST 17 27  ALT 11* 13*  ALKPHOS 70 64  BILITOT 0.4 0.5  PROT 5.2* 5.3*  ALBUMIN 1.9* 1.8*   CBC:  Recent Labs Lab 07/01/14 1000 07/02/14 0800 07/05/14 0500 07/06/14 0345 07/07/14 0340  WBC 14.7* 12.5* 7.6 7.6 8.8  NEUTROABS  --   --  5.2  --   --   HGB 11.8* 12.7* 12.5* 10.3* 10.9*  HCT 36.2* 38.7* 39.0 32.2* 34.2*  MCV 94.3 94.9 95.6 97.0 96.3  PLT 327 349 333 402* 349   CBG:  Recent Labs Lab 07/05/14 2131 07/06/14 0737 07/06/14 1147 07/06/14 1653 07/06/14 2205  GLUCAP 118* 116* 136* 98 132*   Sepsis Labs:  Recent Labs Lab 07/01/14 1737 07/01/14 1805 07/01/14 1909 07/02/14 0800 07/05/14 0500 07/06/14 0345 07/07/14 0340  PROCALCITON 0.37  --   --  0.31  --   --   --   WBC  --   --   --  12.5* 7.6 7.6 8.8  LATICACIDVEN  --  1.6 1.6  --   --   --   --    Microbiology Recent Results (from the past 240 hour(s))  Culture, Urine     Status: None   Collection Time: 06/27/14 11:51 AM  Result Value Ref Range Status   Specimen Description URINE, CATHETERIZED  Final   Special Requests NONE  Final   Colony Count NO  GROWTH Performed at [facility]   Final   Culture NO GROWTH Performed at [facility]   Final   Report Status 06/28/2014 FINAL  Final  Culture, blood (routine x 2)     Status: None   Collection Time: 06/27/14 12:47 PM  Result Value Ref Range Status   Specimen Description BLOOD LEFT ARM  Final   Special Requests BOTTLES DRAWN AEROBIC ONLY 10CC AEROBIC ONLY  Final   Culture   Final    NO GROWTH 5 DAYS Performed at [facility]    Report Status 07/03/2014  FINAL  Final  Culture, blood (routine x 2)     Status: None   Collection Time: 06/27/14 12:54 PM  Result Value Ref Range Status   Specimen Description BLOOD LEFT ARM  Final   Special Requests   Final    BOTTLES DRAWN AEROBIC AND ANAEROBIC 10CC BOTH BOTTLES   Culture   Final    NO GROWTH 5 DAYS Performed at [facility]    Report Status 07/03/2014 FINAL  Final  Clostridium Difficile by PCR (not at [facility])     Status: None   Collection Time: 06/27/14  6:00 PM  Result Value Ref Range Status   C difficile by pcr NEGATIVE NEGATIVE Final  Culture, blood (x 2)     Status: None (Preliminary result)   Collection Time: 07/01/14  7:05 PM  Result Value Ref Range Status   Specimen Description BLOOD LEFT HAND  Final   Special Requests BOTTLES DRAWN AEROBIC ONLY [place]  Final   Culture   Final    NO GROWTH 4 DAYS Performed at [facility]    Report Status PENDING  Incomplete  Culture, blood (x 2)     Status: None (Preliminary result)   Collection Time: 07/01/14  7:20 PM  Result Value Ref Range Status   Specimen Description BLOOD LEFT ARM  Final   Special Requests BOTTLES DRAWN AEROBIC AND ANAEROBIC 10CC  Final   Culture   Final    NO GROWTH 4 DAYS Performed at [facility]    Report Status PENDING  Incomplete     Medications:   . antiseptic oral rinse  7 mL Mouth Rinse q12n4p  . atenolol  100 mg Oral Daily  . atorvastatin  10 mg Oral QHS  . cefTRIAXone (ROCEPHIN)  IV  1 g  Intravenous QHS  . chlorhexidine  15 mL Mouth Rinse BID  . feeding supplement (PRO-STAT 64)  30 mL Oral TID WC  . gabapentin  100 mg Oral BID  . insulin aspart  0-20 Units Subcutaneous TID WC  . insulin aspart  0-5 Units Subcutaneous QHS  . nystatin  5 mL Oral QID  . nystatin   Topical TID   Continuous Infusions: . sodium chloride Stopped (06/29/14 0806)  . sodium chloride 50 mL/hr at 07/06/14 1800  . diltiazem (CARDIZEM) infusion 5 mg/hr (07/06/14 2212)  . heparin 1,750 Units/hr (07/06/14 2015)    Time spent: 35 minutes.  The patient is medically complex and requires high complexity decision making.    LOS: 12 days   [facility] Pager [phone]. If unable to reach me by pager, please call my cell phone at [phone].  *Please refer to [email or web address], password [email or web address] to get updated schedule on who will round on this patient, as hospitalists switch teams weekly. If 7PM-7AM, please contact night-coverage at [email or web address], password [email or web address] for any overnight needs.  07/07/2014, 7:16 AM

## 2014-07-07 NOTE — Progress Notes (Signed)
Pt.is A/Ox2 with periods of intermitment confusion. Pt.began to have hallucinations during the shift. Paged and notified Mid-level provider with [facility] . New orders were written. U/A was done and was positive for bacteria. New orders were given.

## 2014-07-08 ENCOUNTER — Encounter (HOSPITAL_COMMUNITY): Payer: Self-pay | Admitting: Radiology

## 2014-07-08 ENCOUNTER — Inpatient Hospital Stay (HOSPITAL_COMMUNITY): Payer: Medicare HMO

## 2014-07-08 DIAGNOSIS — N390 Urinary tract infection, site not specified: Secondary | ICD-10-CM

## 2014-07-08 DIAGNOSIS — B965 Pseudomonas (aeruginosa) (mallei) (pseudomallei) as the cause of diseases classified elsewhere: Secondary | ICD-10-CM

## 2014-07-08 LAB — GLUCOSE, CAPILLARY
GLUCOSE-CAPILLARY: 101 mg/dL — AB (ref 65–99)
Glucose-Capillary: 135 mg/dL — ABNORMAL HIGH (ref 65–99)
Glucose-Capillary: 136 mg/dL — ABNORMAL HIGH (ref 65–99)
Glucose-Capillary: 130 mg/dL — ABNORMAL HIGH (ref 65–99)

## 2014-07-08 LAB — QUANTIFERON IN TUBE
QFT TB AG MINUS NIL VALUE: 0 [IU]/mL
QUANTIFERON MITOGEN VALUE: 0.82 IU/mL
QUANTIFERON TB AG VALUE: 0.04 IU/mL
QUANTIFERON TB GOLD: NEGATIVE
Quantiferon Nil Value: 0.04 IU/mL

## 2014-07-08 LAB — QUANTIFERON TB GOLD ASSAY (BLOOD)

## 2014-07-08 LAB — BASIC METABOLIC PANEL
ANION GAP: 9 (ref 5–15)
BUN: 14 mg/dL (ref 6–20)
CO2: 28 mmol/L (ref 22–32)
Calcium: 7.9 mg/dL — ABNORMAL LOW (ref 8.9–10.3)
Chloride: 103 mmol/L (ref 101–111)
Creatinine, Ser: 0.74 mg/dL (ref 0.61–1.24)
Glucose, Bld: 130 mg/dL — ABNORMAL HIGH (ref 65–99)
POTASSIUM: 4.4 mmol/L (ref 3.5–5.1)
Sodium: 140 mmol/L (ref 135–145)

## 2014-07-08 LAB — APTT
APTT: 43 s — AB (ref 24–37)
aPTT: 70 seconds — ABNORMAL HIGH (ref 24–37)

## 2014-07-08 LAB — CBC
HCT: 36.8 % — ABNORMAL LOW (ref 39.0–52.0)
HEMOGLOBIN: 11.5 g/dL — AB (ref 13.0–17.0)
MCH: 30.8 pg (ref 26.0–34.0)
MCHC: 31.3 g/dL (ref 30.0–36.0)
MCV: 98.7 fL (ref 78.0–100.0)
PLATELETS: 383 10*3/uL (ref 150–400)
RBC: 3.73 MIL/uL — AB (ref 4.22–5.81)
RDW: 14.1 % (ref 11.5–15.5)
WBC: 7.5 10*3/uL (ref 4.0–10.5)

## 2014-07-08 LAB — HEPARIN LEVEL (UNFRACTIONATED)
Heparin Unfractionated: 0.86 IU/mL — ABNORMAL HIGH (ref 0.30–0.70)
Heparin Unfractionated: 0.92 IU/mL — ABNORMAL HIGH (ref 0.30–0.70)

## 2014-07-08 MED ORDER — PIPERACILLIN-TAZOBACTAM 3.375 G IVPB
3.3750 g | Freq: Three times a day (TID) | INTRAVENOUS | Status: DC
Start: 2014-07-08 — End: 2014-07-15
  Administered 2014-07-08 – 2014-07-15 (×20): 3.375 g via INTRAVENOUS
  Filled 2014-07-08 (×19): qty 50

## 2014-07-08 MED ORDER — DILTIAZEM HCL 100 MG IV SOLR
5.0000 mg/h | INTRAVENOUS | Status: DC
  Administered 2014-07-09 (×2): 5 mg/h via INTRAVENOUS
  Filled 2014-07-08 (×4): qty 100

## 2014-07-08 MED ORDER — IOHEXOL 300 MG/ML  SOLN
100.0000 mL | Freq: Once | INTRAMUSCULAR | Status: AC | PRN
Start: 2014-07-08 — End: 2014-07-08
  Administered 2014-07-08: 100 mL via INTRAVENOUS

## 2014-07-08 NOTE — Progress Notes (Signed)
OT Cancellation Note  Patient Details Name: John Paul MRN: [id number] DOB: 09-15-1940   Cancelled Treatment:    Reason Eval/Treat Not Completed: Other (comment).  Pt sleeping and wife reports that he has had a lot of pain today; prefers therapy check back another time.  Jennelle Pinkstaff 07/08/2014, 12:13 PM  Marica Otter, OTR/L [phone] 07/08/2014

## 2014-07-08 NOTE — Progress Notes (Signed)
Pt refuses BIPAP QHS.  RT to monitor and assess as needed.  

## 2014-07-08 NOTE — Progress Notes (Addendum)
ANTICOAGULATION CONSULT NOTE -Follow Up Consult  Pharmacy Consult for heparin Indication: new  pulmonary embolus  No Known Allergies  Patient Measurements: Height: 5\' 10"  (177.8 cm) Weight: (!) 361 lb 8.9 oz (164 kg) IBW/kg (Calculated) : 73 Heparin Dosing Weight: 111 kg  Vital Signs: Temp: 99.6 F (37.6 C) (06/22 0400) Temp Source: Oral (06/22 0400) BP: 92/47 mmHg (06/22 0600) Pulse Rate: 109 (06/22 0600)  Labs:  Recent Labs  07/06/14 0345 07/07/14 0340 07/08/14 0340 07/08/14 0521  HGB 10.3* 10.9* 11.5*  --   HCT 32.2* 34.2* 36.8*  --   PLT 402* 349 383  --   APTT 76* 82*  --  43*  HEPARINUNFRC >2.20* 1.86*  --  0.86*  CREATININE 0.76 0.79 0.74  --     Estimated Creatinine Clearance: 131.1 mL/min (by C-G formula based on Cr of 0.74).   Medical History: Past Medical History  Diagnosis Date  . Diabetes mellitus   . Hypertension   . Hyperlipidemia   . Morbid obesity     WITH HYPOVENTILATION  . OSA (obstructive sleep apnea)   . Atrial fibrillation   . Acute pulmonary embolism with right heart strain 07/05/2014   Assessment: 73 yoM previously on xarelto 20mg  daily for Afib. Chest CT performed 6/18 positive for R PE with R heart strain.  Started on IV heparin per pharmacy for new PE.  Today, 07/08/2014:  Heparin level 0.86 (possibly still elevated from Xarelto, given low aPTT)  aPTT previously therapeutic, now low on heparin 1750 units/hr  Hgb improved today; no bleeding or line issues per nursing.  Plt wnl  CrCl wnl   Goal of Therapy:  Heparin level 0.3-0.7 units/ml; aPTT 66-102 secs Monitor platelets by anticoagulation protocol: Yes   Plan:   In absence of any overnight events that would have interrupted heparin therapy, unclear why aPTT is now low.  Given trajectory of heparin levels, still feel that aPTT is most accurate at this time.  Will repeat aPTT and heparin level STAT  Continue IV heparin at 1750 units/hr for now; will adjust as  confirmed by repeat levels.  [facility], continue daily aPTT until HL normalizes off Xarelto  Monitor closely for s/s bleeding  F/u plans for long-term anticoagulation: either resume Xarelto for PE treatment vs. switching agents  Bernadene Person, PharmD Pager: [phone] 07/08/2014, 8:27 AM  Addendum:  Repeat aPTT therapeutic  Due to unresponsiveness, ordered head CT to r/o ischemic/hemorrhagic CVA - normal appearance on CT  Continue heparin as above  Bernadene Person, PharmD Pager: [phone] 07/08/2014, 4:38 PM

## 2014-07-08 NOTE — Progress Notes (Signed)
ANTIBIOTIC CONSULT NOTE - INITIAL  Pharmacy Consult for zosyn Indication: UTI  No Known Allergies  Patient Measurements: Height:  (177.8 cm) Weight: (!) 361 lb 8.9 oz (164 kg) IBW/kg (Calculated) : 73   Vital Signs: Temp: 99 F (37.2 C) (06/22 2005) Temp Source: Axillary (06/22 2005) BP: 118/73 mmHg (06/22 1800) Pulse Rate: 124 (06/22 1800) Intake/Output from previous day: 06/21 0701 - 06/22 0700 In: 2800 [P.O.:840; I.V.:1860; IV Piggyback:100] Out: 1251 [Urine:1250; Stool:1] Intake/Output from this shift:    Labs:  Recent Labs  07/06/14 0345 07/07/14 0340 07/08/14 0340  WBC 7.6 8.8 7.5  HGB 10.3* 10.9* 11.5*  PLT 402* 349 383  CREATININE 0.76 0.79 0.74   Estimated Creatinine Clearance: 131.1 mL/min (by C-G formula based on Cr of 0.74). No results for input(s): VANCOTROUGH, VANCOPEAK, VANCORANDOM, GENTTROUGH, GENTPEAK, GENTRANDOM, TOBRATROUGH, TOBRAPEAK, TOBRARND, AMIKACINPEAK, AMIKACINTROU, AMIKACIN in the last 72 hours.   Microbiology: Recent Results (from the past 720 hour(s))  Blood culture (routine x 2)     Status: None   Collection Time: 07/18/2014  3:28 PM  Result Value Ref Range Status   Specimen Description BLOOD RIGHT ARM  Final   Special Requests BOTTLES DRAWN AEROBIC AND ANAEROBIC 5CC EACH  Final   Culture   Final    NO GROWTH 5 DAYS Performed at [facility]    Report Status 07/01/2014 FINAL  Final  Blood culture (routine x 2)     Status: None   Collection Time: 07/18/2014  3:29 PM  Result Value Ref Range Status   Specimen Description BLOOD LEFT ARM  Final   Special Requests BOTTLES DRAWN AEROBIC AND ANAEROBIC 5CC EACH  Final   Culture   Final    NO GROWTH 5 DAYS Performed at [facility]    Report Status 07/01/2014 FINAL  Final  Urine culture     Status: None   Collection Time: 2014-07-18  5:46 PM  Result Value Ref Range Status   Specimen Description Urine  Final   Special Requests Normal  Final   Colony Count NO  GROWTH Performed at [facility]   Final   Culture NO GROWTH Performed at [facility]   Final   Report Status 06/26/2014 FINAL  Final  MRSA PCR Screening     Status: None   Collection Time: July 18, 2014 10:01 PM  Result Value Ref Range Status   MRSA by PCR NEGATIVE NEGATIVE Final    Comment:        The GeneXpert MRSA Assay (FDA approved for NASAL specimens only), is one component of a comprehensive MRSA colonization surveillance program. It is not intended to diagnose MRSA infection nor to guide or monitor treatment for MRSA infections.   Culture, Urine     Status: None   Collection Time: 06/26/14  1:45 AM  Result Value Ref Range Status   Specimen Description URINE, CATHETERIZED  Final   Special Requests NONE  Final   Colony Count NO GROWTH Performed at [facility]   Final   Culture NO GROWTH Performed at [facility]   Final   Report Status 06/27/2014 FINAL  Final  Culture, Urine     Status: None   Collection Time: 06/27/14 11:51 AM  Result Value Ref Range Status   Specimen Description URINE, CATHETERIZED  Final   Special Requests NONE  Final   Colony Count NO GROWTH Performed at [facility]   Final   Culture NO GROWTH Performed at [facility]   Final   Report Status 06/28/2014 FINAL  Final  Culture, blood (routine x 2)     Status: None   Collection Time: 06/27/14 12:47 PM  Result Value Ref Range Status   Specimen Description BLOOD LEFT ARM  Final   Special Requests BOTTLES DRAWN AEROBIC ONLY 10CC AEROBIC ONLY  Final   Culture   Final    NO GROWTH 5 DAYS Performed at [facility]    Report Status 07/03/2014 FINAL  Final  Culture, blood (routine x 2)     Status: None   Collection Time: 06/27/14 12:54 PM  Result Value Ref Range Status   Specimen Description BLOOD LEFT ARM  Final   Special Requests   Final    BOTTLES DRAWN AEROBIC AND ANAEROBIC 10CC BOTH BOTTLES   Culture   Final    NO GROWTH 5  DAYS Performed at [facility]    Report Status 07/03/2014 FINAL  Final  Clostridium Difficile by PCR (not at [facility])     Status: None   Collection Time: 06/27/14  6:00 PM  Result Value Ref Range Status   C difficile by pcr NEGATIVE NEGATIVE Final  Culture, blood (x 2)     Status: None   Collection Time: 07/01/14  7:05 PM  Result Value Ref Range Status   Specimen Description BLOOD LEFT HAND  Final   Special Requests BOTTLES DRAWN AEROBIC ONLY 6CC  Final   Culture   Final    NO GROWTH 5 DAYS Performed at [facility]    Report Status 07/07/2014 FINAL  Final  Culture, blood (x 2)     Status: None   Collection Time: 07/01/14  7:20 PM  Result Value Ref Range Status   Specimen Description BLOOD LEFT ARM  Final   Special Requests BOTTLES DRAWN AEROBIC AND ANAEROBIC 10CC  Final   Culture   Final    NO GROWTH 5 DAYS Performed at [facility]    Report Status 07/07/2014 FINAL  Final  Culture, Urine     Status: None (Preliminary result)   Collection Time: 07/06/14 11:20 PM  Result Value Ref Range Status   Specimen Description URINE, RANDOM  Final   Special Requests NONE  Final   Culture   Final    >=100,000 COLONIES/mL PSEUDOMONAS AERUGINOSA Performed at [facility]    Report Status PENDING  Incomplete    Medical History: Past Medical History  Diagnosis Date  . Diabetes mellitus   . Hypertension   . Hyperlipidemia   . Morbid obesity     WITH HYPOVENTILATION  . OSA (obstructive sleep apnea)   . Atrial fibrillation   . Acute pulmonary embolism with right heart strain 07/05/2014    Medications:  Prescriptions prior to admission  Medication Sig Dispense Refill Last Dose  . Alcohol Swabs (B-D SINGLE USE SWABS REGULAR) PADS    07/15/2014 at Unknown time  . atenolol (TENORMIN) 100 MG tablet    06/18/2014 at 1030  . docusate sodium (COLACE) 100 MG capsule Take 100 mg by mouth 2 (two) times daily.   Past Week at Unknown time  . metFORMIN (GLUCOPHAGE-XR)  500 MG 24 hr tablet Take 500 mg by mouth 2 (two) times daily.   07/14/2014 at Unknown time  . Multiple Vitamin (MULTIVITAMIN) tablet Take 1 tablet by mouth daily.     06/30/2014 at Unknown time  . OVER THE COUNTER MEDICATION 1 tablet daily as needed (Sudafed- strength unknown).  06/26/14 at Unknown time  . quinapril (ACCUPRIL) 20 MG tablet    06/26/14 at Unknown time  . rivaroxaban (XARELTO) 20 MG TABS tablet Take 1 tablet (20 mg total) by mouth daily. 30 tablet 11 06-26-2014 at 1030  . simvastatin (ZOCOR) 20 MG tablet Take 20 mg by mouth at bedtime.     Past Week at Unknown time  . traMADol (ULTRAM) 50 MG tablet    06/26/14 at Unknown time  . triamterene-hydrochlorothiazide (DYAZIDE) 37.5-25 MG per capsule Take 1 capsule by mouth every morning.     06/26/2014 at Unknown time  . TRUE METRIX BLOOD GLUCOSE TEST test strip    26-Jun-2014 at Unknown time  . glyBURIDE-metformin (GLUCOVANCE) 2.5-500 MG per tablet Take 1 tablet by mouth 2 (two) times daily with a meal.     Not Taking at Unknown time   Scheduled:  . antiseptic oral rinse  7 mL Mouth Rinse q12n4p  . atenolol  100 mg Oral Daily  . atorvastatin  10 mg Oral QHS  . chlorhexidine  15 mL Mouth Rinse BID  . feeding supplement (PRO-STAT 64)  30 mL Oral TID WC  . gabapentin  100 mg Oral BID  . insulin aspart  0-20 Units Subcutaneous TID WC  . insulin aspart  0-5 Units Subcutaneous QHS  . nystatin  5 mL Oral QID  . nystatin   Topical TID   Infusions:  . sodium chloride Stopped (06/29/14 0806)  . sodium chloride 50 mL/hr at 07/07/14 0800  . diltiazem (CARDIZEM) infusion Stopped (07/07/14 1400)  . heparin 1,750 Units/hr (07/08/14 1800)   Assessment: 6/9 >> Vanc >> 6/10  6/9 >> Zosyn >> 6/10  6/9 >> Flagyl >> 6/13  6/9 >> PO nystatin >>  6/15 >> vanc >> 6/17  6/15 >> Zosyn >> 6/17  6/21 >> Rocephin >> 6/22 6/22 >> zosyn >>  Temp: afebrile  WBC: wnl  Renal: SCr wnl; CrCl > 90 CG   6/9 MRSA PCR negative  6/9 blood x2: NGF  6/10 urine: NGF   6/9 cdiff PCR: negative  6/10 HIV antibody: neg  6/15 blood x2: NGF  6/20 urine: P. Aeruginosa (Susc pending)  Goal of Therapy:  Dose per patient-specific parameters  Plan:   Zosyn 3.375gm IV q8h over 4h infusion  No further dose adjustment needed at this time, follow at distance  Follow susceptibilities from culture  Juliette Alcide, PharmD, BCPS.   Pager: [phone]  07/08/2014,8:06 PM

## 2014-07-08 NOTE — Progress Notes (Signed)
[facility] PROGRESS NOTE  Blake Vetrano LYY:[id number] DOB: [birth year] DOA: 07/04/2014 PCP: Chesley Noon, MD  Assessment/Plan:  Hypovolemic shock/hypotension in the setting of severe diarrhea with lactic acidosis/severe dehydration rule out sepsis - Status post aggressive fluid volume resuscitation. - Empiric Flagyl started on admission and discontinued 06/29/14 after C. difficile studies negative. - C. difficile negative. Stool pathogen panel negative. - Given ongoing lack of improvement and concerns for occult infection, a CT of the abdomen/pelvis was obtained 07/04/14. - Suspect diarrhea related to ischemic insult to the bowel/colitis in the setting of large pulmonary embolism. -Stable   Acute respiratory distress secondary to pulmonary vascular congestion and large right mainstem pulmonary embolism/ cardiomegaly/ right heart strain / acute systolic right heart failure - On 07/01/14, the pt developed acute respiratory distress. CXR showed cardiomegaly and pulmonary congestion. Lasix given. - CT of the chest obtained 07/04/14 with an incidentally discovered large right mainstem PE. - Xarelto was discontinued and the patient was started on a heparin drip. - Per PCCM and IR, no current role for clot dissolution. - 2-D echo done 07/05/14. EF 55-60 percent, no regional wall motion abnormality but systolic function reduced in the right ventricle.   Oral thrush - Improved with nystatin.   Atrial fibrillation with RVR / permanent atrial fibrillation on chronic anticoagulation with Xarelto - On 06/26/14, HR increased into the 130s and EKG showed atrial flutter. Patient was started on Cardizem IV. Atenolol resumed. - Was on chronic Xarelto 20 mg daily and atenolol. Atenolol was held on admission. Pt now on heparin given PE. - Cardiology consultation requested after patient developed hypotension necessitating holding atenolol. - Per cardiologist, no plans for inpatient cardioversion. Will  need follow-up post discharge. - Suspect atrial fibrillation/flutter acutely worsened rate control secondary to large PE.  - Pt was continued on cardizem as of 6/21, since weaned to off   UTI - U/A obtained 07/06/14 after heart rate elevated. Urine was cloudy with positive nitrites and moderate leukocytes, many bacteria. - Empiric Rocephin was started - Pseudomonas on urine cx. Will transition abx to ciprofloxacin   Delirium  - Receiving haldol PRN secondary to delirium.    Hyperlipidemia - Continue statin therapy.   Severe physical deconditioning - Seen by physical therapy. SNF recommended.   Diabetes mellitus type 2, noninsulin dependent - Hemoglobin A1c 6.3%. Oral hypoglycemics on hold. - Currently on insulin resistant SSI.   Hyperkalemia / hyponatremia / hypomagnesemia / non-anion gap metabolic acidosis - Hyperkalemia, hyponatremia resolved with IV fluids. - Magnesium replaced.   Hypocalcemia - Given 1 g of calcium gluconate 06/19/2014.   Leukocytosis / rule out sepsis - Blood cultures done 07/05/2014: Negative. - Urine cultures done 07/09/2014: Negative. - Repeat blood cultures done 07/01/14: Negative to date. - Repeat urine cultures done 07/06/14: >100,000 psudomonas   Obstructive sleep apnea / morbid obesity / hypoalbuminemia - BMI 49.07. Continue heart healthy/carbohydrate modified diet. - Dietitian evaluation done 06/26/14. - Not on C Pap. Nocturnal BiPAP started 07/01/14, however patient refuses.   Pressure ulcer - Evaluated by wound care nurse, continue silicone dressing over sacral area and elbow. - Continue bariatric mattress and bilateral pressure redistribution boots.   Normocytic anemia - Likely anemia of chronic disease. No current indication for transfusion.   Rule out polyarticular arthritis/inflammatory bowel disease  - ESR 130, CK low at 20. LDH normal at 130. Follow-up autoimmune evaluation ordered by ID. - RF elevated at 14.8. The low positive  predictive value of the RF casts doubt on the utility  of the RF in the diagnostic evaluation of patients. Arch Intern Med. 1992 Dec;152(12):2417-20. - ANA negative. Acute hepatitis panel negative. Anti-double-stranded DNA antibody negative. - Sjogren's syndrome antibodies negative. ANCA negative. - F/U CCP, cryoglobulins, SPEP.   DVT Prophylaxis - Heparin gtt  Code Status: Full Family Communication: Pt in room, family at bedside (indicate person spoken with, relationship, and if by phone, the number) Disposition Plan: Pending   Consultants:  ID  PCCM  IR  Procedures:    Antibiotics:  Vancomycin 06/21/2014---> 06/26/14, restarted 07/01/14---> 07/03/14  Zosyn 06/29/2014---> 06/26/14, restarted 07/01/14---> 07/03/14, restarted 6/22  Flagyl 07/14/2014---> 06/29/14  Rocephin 07/07/14--->6/22  HPI/Subjective: Tired. Unable to obtain subjective.   Objective: Filed Vitals:   07/08/14 0909 07/08/14 1000 07/08/14 1100 07/08/14 1200  BP: 1_0   Pulse: 108 108 111   Temp:    97.7 F (36.5 C)  TempSrc:    Oral  Resp: _1 Height:      Weight:      SpO2: 99% 94% 100%     Intake/Output Summary (Last 24 hours) at 07/08/14 1511 Last data filed at 07/08/14 1138  Gross per 24 hour  Intake 1572.5 ml  Output   1230 ml  Net  342.5 ml   Filed Weights   07/06/14 0332 07/07/14 0432 07/08/14 0500  Weight: 166.017 kg (366 lb) 163.749 kg (361 lb) 164 kg (361 lb 8.9 oz)    Exam:   General:  Asleep, arousable in nad  Cardiovascular: regular, tachycardic, s1, s2  Respiratory: normal resp effort, no wheezing  Abdomen: soft, nondistended  Musculoskeletal: perfused, no clubbing   Data Reviewed: Basic Metabolic Panel:  Recent Labs Lab 07/02/14 0800 07/05/14 0018 07/05/14 0510 07/06/14 0345 07/07/14 0340 07/08/14 0340  NA 138 136  --  138 141 140  K 3.9 3.2*  --  3.4* 4.2 4.4  CL 103 101  --  102 104 103  CO2 24 28  --  _2 GLUCOSE 142* 125*  --  152*  121* 130*  BUN 17 17  --  _3 CREATININE 0.93 0.90  --  0.76 0.79 0.74  CALCIUM 7.4* 7.1*  --  7.7* 7.8* 7.9*  MG 1.4*  --  1.3* 1.8  --   --    Liver Function Tests:  Recent Labs Lab 07/02/14 0800 07/05/14 0018  AST 17 27  ALT 11* 13*  ALKPHOS 70 64  BILITOT 0.4 0.5  PROT 5.2* 5.3*  ALBUMIN 1.9* 1.8*   No results for input(s): LIPASE, AMYLASE in the last 168 hours. No results for input(s): AMMONIA in the last 168 hours. CBC:  Recent Labs Lab 07/02/14 0800 07/05/14 0500 07/06/14 0345 07/07/14 0340 07/08/14 0340  WBC 12.5* 7.6 7.6 8.8 7.5  NEUTROABS  --  5.2  --   --   --   HGB 12.7* 12.5* 10.3* 10.9* 11.5*  HCT 38.7* 39.0 32.2* 34.2* 36.8*  MCV 94.9 95.6 97.0 96.3 98.7  PLT 349 333 402* 349 383   Cardiac Enzymes:  Recent Labs Lab 07/04/14 1815 07/05/14 0500  CKTOTAL  --  20*  TROPONINI <0.03  --    BNP (last 3 results)  Recent Labs  07/06/2014 1546  BNP 92.8    ProBNP (last 3 results) No results for input(s): PROBNP in the last 8760 hours.  CBG:  Recent Labs Lab 07/07/14 1152 07/07/14 1554 07/07/14 2142 07/08/14 0819 07/08/14 1210  GLUCAP 182* 108* 121*  135* 136*    Recent Results (from the past 240 hour(s))  Culture, blood (x 2)     Status: None   Collection Time: 07/01/14  7:05 PM  Result Value Ref Range Status   Specimen Description BLOOD LEFT HAND  Final   Special Requests BOTTLES DRAWN AEROBIC ONLY [place]  Final   Culture   Final    NO GROWTH 5 DAYS Performed at [facility]    Report Status 07/07/2014 FINAL  Final  Culture, blood (x 2)     Status: None   Collection Time: 07/01/14  7:20 PM  Result Value Ref Range Status   Specimen Description BLOOD LEFT ARM  Final   Special Requests BOTTLES DRAWN AEROBIC AND ANAEROBIC 10CC  Final   Culture   Final    NO GROWTH 5 DAYS Performed at [facility]    Report Status 07/07/2014 FINAL  Final  Culture, Urine     Status: None (Preliminary result)   Collection Time:  07/06/14 11:20 PM  Result Value Ref Range Status   Specimen Description URINE, RANDOM  Final   Special Requests NONE  Final   Culture   Final    >=100,000 COLONIES/mL PSEUDOMONAS AERUGINOSA Performed at [facility]    Report Status PENDING  Incomplete     Studies: No results found.  Scheduled Meds: . antiseptic oral rinse  7 mL Mouth Rinse q12n4p  . atenolol  100 mg Oral Daily  . atorvastatin  10 mg Oral QHS  . cefTRIAXone (ROCEPHIN)  IV  1 g Intravenous QHS  . chlorhexidine  15 mL Mouth Rinse BID  . feeding supplement (PRO-STAT 64)  30 mL Oral TID WC  . gabapentin  100 mg Oral BID  . insulin aspart  0-20 Units Subcutaneous TID WC  . insulin aspart  0-5 Units Subcutaneous QHS  . nystatin  5 mL Oral QID  . nystatin   Topical TID   Continuous Infusions: . sodium chloride Stopped (06/29/14 0806)  . sodium chloride 50 mL/hr at 07/07/14 0800  . diltiazem (CARDIZEM) infusion Stopped (07/07/14 1400)  . heparin 1,750 Units/hr (07/08/14 1000)    Principal Problem:   Hypovolemic shock in the setting of severe diarrhea Active Problems:   Atrial fibrillation with RVR   Morbid obesity   Hyperlipidemia   Diabetes mellitus type 2, noninsulin dependent   Hypotension   Acute kidney injury   Diarrhea   Dehydration, severe   Hyperkalemia   Hyponatremia   Lactic acidosis   Chronic anticoagulation-Xarelto   Permanent atrial fibrillation   Leukocytosis   Pulmonary vascular congestion   Sepsis   Pressure ulcer   Hypoalbuminemia   Normocytic anemia   Acute renal failure syndrome   Coagulopathy   FUO (fever of unknown origin)   IBD (inflammatory bowel disease)   Other noninfectious gastroenteritis   Myalgia and myositis   Polyarticular arthritis   Acute pulmonary embolism with right heart strain   Acute right-sided heart failure   Acute delirium    CHIU, STEPHEN K  [facility] Pager [phone]. If 7PM-7AM, please contact night-coverage at [email or web address],  password [facility] 07/08/2014, 3:11 PM  LOS: 13 days

## 2014-07-08 NOTE — Progress Notes (Signed)
PT Cancellation Note  Patient Details Name: John Paul MRN: [id number] DOB: [birth year]   Cancelled Treatment:    Reason Eval/Treat Not Completed: Pt sleeping. Wife requested PT check back another time. Will check back another day. Thanks.    Rebeca Alert, MPT Pager: [phone]

## 2014-07-09 LAB — HEPARIN LEVEL (UNFRACTIONATED): Heparin Unfractionated: 0.61 IU/mL (ref 0.30–0.70)

## 2014-07-09 LAB — CBC
HEMATOCRIT: 36.7 % — AB (ref 39.0–52.0)
HEMOGLOBIN: 11.4 g/dL — AB (ref 13.0–17.0)
MCH: 30.9 pg (ref 26.0–34.0)
MCHC: 31.1 g/dL (ref 30.0–36.0)
MCV: 99.5 fL (ref 78.0–100.0)
Platelets: 374 10*3/uL (ref 150–400)
RBC: 3.69 MIL/uL — ABNORMAL LOW (ref 4.22–5.81)
RDW: 14.1 % (ref 11.5–15.5)
WBC: 7.3 10*3/uL (ref 4.0–10.5)

## 2014-07-09 LAB — BASIC METABOLIC PANEL
Anion gap: 8 (ref 5–15)
BUN: 14 mg/dL (ref 6–20)
CALCIUM: 7.7 mg/dL — AB (ref 8.9–10.3)
CO2: 26 mmol/L (ref 22–32)
Chloride: 104 mmol/L (ref 101–111)
Creatinine, Ser: 0.75 mg/dL (ref 0.61–1.24)
GFR calc Af Amer: >60 mL/min (ref >60)
GFR calc non Af Amer: >60 mL/min (ref >60)
Glucose, Bld: 119 mg/dL — ABNORMAL HIGH (ref 65–99)
POTASSIUM: 4.2 mmol/L (ref 3.5–5.1)
SODIUM: 138 mmol/L (ref 135–145)

## 2014-07-09 LAB — APTT: APTT: 58 s — AB (ref 24–37)

## 2014-07-09 LAB — CRYOGLOBULIN

## 2014-07-09 LAB — GLUCOSE, CAPILLARY
GLUCOSE-CAPILLARY: 127 mg/dL — AB (ref 65–99)
Glucose-Capillary: 120 mg/dL — ABNORMAL HIGH (ref 65–99)
Glucose-Capillary: 123 mg/dL — ABNORMAL HIGH (ref 65–99)

## 2014-07-09 LAB — URINE CULTURE: Culture: >=100000

## 2014-07-09 LAB — MAGNESIUM: MAGNESIUM: 1.4 mg/dL — AB (ref 1.7–2.4)

## 2014-07-09 MED ORDER — SODIUM CHLORIDE 0.9 % IJ SOLN: 10.0000 mL | INTRAMUSCULAR | Status: DC | PRN

## 2014-07-09 MED ORDER — SODIUM CHLORIDE 0.9 % IJ SOLN
10.0000 mL | Freq: Two times a day (BID) | INTRAMUSCULAR | Status: DC
  Administered 2014-07-09 – 2014-07-15 (×11): 10 mL

## 2014-07-09 NOTE — Progress Notes (Addendum)
ANTICOAGULATION CONSULT NOTE -Follow Up Consult  Pharmacy Consult for heparin Indication: new  pulmonary embolus  No Known Allergies  Patient Measurements: Height: 5\' 10"  (177.8 cm) Weight: (!) 361 lb 5.3 oz (163.9 kg) IBW/kg (Calculated) : 73 Heparin Dosing Weight: 111 kg  Vital Signs: Temp: 98.7 F (37.1 C) (06/23 0341) Temp Source: Axillary (06/23 0341) BP: 112/61 mmHg (06/23 0600) Pulse Rate: 72 (06/23 0600)  Labs:  Recent Labs  07/07/14 0340 07/08/14 0340 07/08/14 0521 07/08/14 0855 07/09/14 0400  HGB 10.9* 11.5*  --   --  11.4*  HCT 34.2* 36.8*  --   --  36.7*  PLT 349 383  --   --  374  APTT 82*  --  43* 70* 58*  HEPARINUNFRC 1.86*  --  0.86* 0.92* 0.61  CREATININE 0.79 0.74  --   --  0.75    Estimated Creatinine Clearance: 131.1 mL/min (by C-G formula based on Cr of 0.75).   Medical History: Past Medical History  Diagnosis Date  . Diabetes mellitus   . Hypertension   . Hyperlipidemia   . Morbid obesity     WITH HYPOVENTILATION  . OSA (obstructive sleep apnea)   . Atrial fibrillation   . Acute pulmonary embolism with right heart strain 07/05/2014   Assessment: 73 yoM previously on xarelto 20mg  daily for Afib. Chest CT performed 6/18 positive for R PE with R heart strain.  Started on IV heparin per pharmacy for new PE.  Today, 07/09/2014:  Heparin level, aPTT therapeutic on 1750 units/hr  Hgb sl low but stable; Plt wnl  No bleeding or line issues per nursing  CT head done 6/22 due to patient being minimally responsive - no intracranial abnormalities  CrCl wnl, stable   Goal of Therapy:  Heparin level 0.3-0.7 units/ml; aPTT 66-102 secs Monitor platelets by anticoagulation protocol: Yes   Plan:   Continue IV heparin at 1750 units/hr  Appears Xarelto effect on heparin level has dissipated, can discontinue routine checking of aPTT  Daily heparin level, CBC  Monitor closely for s/s bleeding  F/u resumption of outpatient  anticoagulation.  Patient also has a-fib so LMWH not a long-term option.  Consideration should be given to the fact that exposure of direct oral anticoagulants (thrombin, Xa inhibitors) is shown to be reduced by 20-30% in morbidly obese patients.  With no routine assay for monitoring therapeutic levels for these agents as there is with warfarin, it is likely neither feasible nor safe to increase dosages to account for this reduced exposure.  Bernadene Person, PharmD Pager: [phone] 07/09/2014, 7:29 AM

## 2014-07-09 NOTE — Progress Notes (Signed)
Occupational Therapy Treatment Patient Details Name: John Paul MRN: [id number] DOB: [birth year] Today's Date: 07/09/2014    History of present illness pt was admitted for dehydration:  had nausea, diarrhea, and hpotension.  PMH significant for DM, morbid obesity and A Fib;  NEw PE 07/04/14   OT comments  Pt much weaker than upon evaluation; goals updated today.    Follow Up Recommendations  SNF    Equipment Recommendations  None recommended by OT    Recommendations for Other Services      Precautions / Restrictions Precautions Precautions: Fall Precaution Comments: pain throughout Restrictions Weight Bearing Restrictions: No       Mobility Bed Mobility                  Transfers                      Balance                                   ADL                                         General ADL Comments: pt much weaker than upon eval.  Did not lift RUE against gravity.  Bil UE's with edema.  2 sets of 5, opening and closing hands and repositioned on pillows for edema control.  Educated wife to cue him for this when he is awake:  could be hourly.   Wife reports that pt lifted arms when asleep.        Vision                     Perception     Praxis      Cognition   Behavior During Therapy: Flat affect Overall Cognitive Status: Within Functional Limits for tasks assessed                       Extremity/Trunk Assessment               Exercises  Other Exercises Other Exercises: performed PROM/AAROM for bil wrists and elbows and R shoulder; did not do L shoulder due to pain at evaluation   Shoulder Instructions       General Comments      Pertinent Vitals/ Pain       Pain Assessment: Faces Faces Pain Scale: Hurts even more Pain Location: all over Pain Descriptors / Indicators: Grimacing Pain Intervention(s): Limited activity within patient's tolerance  Home Living                                          Prior Functioning/Environment              Frequency Min 1X/week     Progress Toward Goals  OT Goals(current goals can now be found in the care plan section)  Progress towards OT goals: Not progressing toward goals - comment;Goals drowngraded-see care plan  ADL Goals Pt Will Perform Grooming:  (discontinued) Pt Will Perform Upper Body Bathing:  (discontinued) Additional ADL Goal #1: Wife will be independent with AAROM to UE's (R shoulder only and distal ROM) Additional ADL Goal #2: Pt  will perform grooming task with max A and proximal support Additional ADL Goal #3: Pt will recall strategies for edema control (moving fingers and positioning) with min cues  Plan Discharge plan remains appropriate    Co-evaluation                 End of Session     Activity Tolerance Patient limited by fatigue   Patient Left in bed;with call bell/phone within reach   Nurse Communication          Time: 3710-6269 OT Time Calculation (min): 10 min  Charges: OT General Charges $OT Visit: 1 Procedure OT Treatments $Therapeutic Exercise: 8-22 mins  John Paul 07/09/2014, 3:36 PM   Marica Otter, OTR/L [phone] 07/09/2014

## 2014-07-09 NOTE — Progress Notes (Signed)
Physical Therapy Treatment Patient Details Name: John Paul MRN: [id number] DOB: 27-Oct-1940 Today's Date: 07/09/2014    History of Present Illness pt was admitted for dehydration:  had nausea, diarrhea, and hpotension.  PMH significant for DM, morbid obesity and A Fib;  NEw PE 07/04/14    PT Comments    Attempted some ROM exercises this session. Pt continues to report pain with all movement. Pt not very motivated to participate/progress activity.   Follow Up Recommendations  SNF     Equipment Recommendations  None recommended by PT    Recommendations for Other Services OT consult     Precautions / Restrictions Precautions Precautions: Fall Precaution Comments: pt has pain in bil LEs and LUE Restrictions Weight Bearing Restrictions: No    Mobility  Bed Mobility                  Transfers                    Ambulation/Gait                 Stairs            Wheelchair Mobility    Modified Rankin (Stroke Patients Only)       Balance                                    Cognition Arousal/Alertness: Lethargic Behavior During Therapy: Flat affect Overall Cognitive Status: Within Functional Limits for tasks assessed                      Exercises General Exercises - Lower Extremity Ankle Circles/Pumps: PROM;5 reps;Both;Supine Short Arc Quad: PROM;Both;5 reps;Supine Other Exercises Other Exercises: slight internal rotation of LEs x 5, both. Pt externally rotated bilaterally     General Comments        Pertinent Vitals/Pain Pain Assessment: Faces Faces Pain Scale: Hurts even more Pain Location: all over Pain Descriptors / Indicators: Grimacing Pain Intervention(s): Limited activity within patient's tolerance;Monitored during session;Repositioned    Home Living                      Prior Function            PT Goals (current goals can now be found in the care plan section) Progress  towards PT goals: Not progressing toward goals - comment (pt not motivated to participate/progress activity)    Frequency  Min 2X/week    PT Plan Current plan remains appropriate    Co-evaluation             End of Session   Activity Tolerance: Patient limited by pain;Patient limited by fatigue Patient left: in bed;with call bell/phone within reach;with family/visitor present     Time: 2620-3559 PT Time Calculation (min) (ACUTE ONLY): 10 min  Charges:  $Therapeutic Activity: 8-22 mins                    G Codes:      Rebeca Alert, MPT Pager: [phone]

## 2014-07-09 NOTE — Progress Notes (Signed)
Date:  July 09, 2014 U.R. performed for needs and level of care. Loc reviewed with medical director appropriate for condition and care. Will continue to follow for Case Management needs.  Marcelle Smiling, RN, BSN, [place]   [phone]

## 2014-07-09 NOTE — Progress Notes (Signed)
Pt refuses BIPAP QHS, RT to monitor and assess as needed.  

## 2014-07-09 NOTE — Progress Notes (Signed)
[facility] PROGRESS NOTE  John Paul RSW:[id number] DOB: October 06, 1940 DOA: 07/01/2014 PCP: Chesley Noon, MD  Assessment/Plan:  Hypovolemic shock/hypotension in the setting of severe diarrhea with lactic acidosis/severe dehydration rule out sepsis - Status post aggressive fluid volume resuscitation. - Empiric Flagyl started on admission and discontinued 06/29/14 after C. difficile studies negative. - C. difficile negative. Stool pathogen panel negative. - Given ongoing lack of improvement and concerns for occult infection, a CT of the abdomen/pelvis was obtained 07/04/14. - Suspect diarrhea related to ischemic insult to the bowel/colitis in the setting of large pulmonary embolism. -Stable   Acute respiratory distress secondary to pulmonary vascular congestion and large right mainstem pulmonary embolism/ cardiomegaly/ right heart strain / acute systolic right heart failure - On 07/01/14, the pt developed acute respiratory distress. CXR showed cardiomegaly and pulmonary congestion. Lasix given. - CT of the chest obtained 07/04/14 with an incidentally discovered large right mainstem PE. - Xarelto was discontinued and the patient was started on a heparin drip. - Per PCCM and IR, no current role for clot dissolution. - 2-D echo done 07/05/14. EF 55-60 percent, no regional wall motion abnormality but systolic function reduced in the right ventricle.   Oral thrush - Improved on nystatin.   Atrial fibrillation with RVR / permanent atrial fibrillation on chronic anticoagulation with Xarelto - On 06/26/14, HR increased into the 130s and EKG showed atrial flutter. Patient was started on Cardizem IV. Atenolol resumed. - Was on chronic Xarelto 20 mg daily and atenolol. Atenolol was held on admission. Pt now on heparin given PE. - Cardiology consultation requested after patient developed hypotension necessitating holding atenolol. - Per cardiologist, no plans for inpatient cardioversion. Will  need follow-up post discharge. - Suspect atrial fibrillation/flutter acutely worsened rate control secondary to large PE vs infection.  - Pt intermittently continued on cardizem as of 6/21   UTI - U/A obtained 07/06/14 after heart rate elevated. Urine was cloudy with positive nitrites and moderate leukocytes, many bacteria. - Empiric Rocephin was started - Multi-drug sensitive pseudomonas on urine cx - Have continued Zosyn - Change out foley cath   Delirium  - Receiving haldol PRN secondary to delirium. - Head CT w/w/o contrast unremarkable for acute process    Hyperlipidemia - Continue statin therapy.   Severe physical deconditioning - Seen by physical therapy. SNF recommended.   Diabetes mellitus type 2, noninsulin dependent - Hemoglobin A1c 6.3%. Oral hypoglycemics on hold. - Currently on insulin resistant SSI.   Hyperkalemia / hyponatremia / hypomagnesemia / non-anion gap metabolic acidosis - Hyperkalemia, hyponatremia resolved with IV fluids. - Magnesium replaced.   Hypocalcemia - Given 1 g of calcium gluconate 07/10/2014.   Leukocytosis / rule out sepsis - Blood cultures done 06/29/2014: Negative. - Urine cultures done 06/19/2014: Negative. - Repeat blood cultures done 07/01/14: Negative to date. - Repeat urine cultures done 07/06/14: >100,000 psudomonas   Obstructive sleep apnea / morbid obesity / hypoalbuminemia - BMI 49.07. Continue heart healthy/carbohydrate modified diet. - Dietitian evaluation done 06/26/14. - Not on C Pap. Nocturnal BiPAP started 07/01/14, however patient refuses.   Pressure ulcer - Evaluated by wound care nurse, continue silicone dressing over sacral area and elbow. - Continue bariatric mattress and bilateral pressure redistribution boots.   Normocytic anemia - Likely anemia of chronic disease. No current indication for transfusion.   Rule out polyarticular arthritis/inflammatory bowel disease  - ESR 130, CK low at 20. LDH normal at 130.  Follow-up autoimmune evaluation ordered by ID. - RF elevated at 14.8.  The low positive predictive value of the RF casts doubt on the utility of the RF in the diagnostic evaluation of patients. Arch Intern Med. 1992 Dec;152(12):2417-20. - ANA negative. Acute hepatitis panel negative. Anti-double-stranded DNA antibody negative. - Sjogren's syndrome antibodies negative. ANCA negative. - F/U CCP, cryoglobulins, SPEP.   DVT Prophylaxis - Heparin gtt  Code Status: Full Family Communication: Pt in room, wife at bedside  Disposition Plan: Pending   Consultants:  ID  PCCM  IR  Procedures:    Antibiotics:  Vancomycin 06/30/2014---> 06/26/14, restarted 07/01/14---> 07/03/14  Zosyn 06/20/2014---> 06/26/14, restarted 07/01/14---> 07/03/14, restarted 6/22  Flagyl 07/13/2014---> 06/29/14  Rocephin 07/07/14--->6/22  HPI/Subjective: More arousable, but unable to give subjective   Objective: Filed Vitals:   07/09/14 1400 07/09/14 1500 07/09/14 1600 07/09/14 1800  BP: 78/54 99/54 104/53 103/71  Pulse:   112 100  Temp:   99.2 F (37.3 C)   TempSrc:   Axillary   Resp: 22 24 38 28  Height:      Weight:      SpO2:  100% 100% 99%    Intake/Output Summary (Last 24 hours) at 07/09/14 1919 Last data filed at 07/09/14 1800  Gross per 24 hour  Intake 2135.09 ml  Output   1172 ml  Net 963.09 ml   Filed Weights   07/07/14 0432 07/08/14 0500 07/09/14 0500  Weight: 163.749 kg (361 lb) 164 kg (361 lb 8.9 oz) 163.9 kg (361 lb 5.3 oz)    Exam:   General:  Asleep, easily arousable, in nad  Cardiovascular: regular, tachycardic, s1, s2  Respiratory: normal resp effort, no wheezing  Abdomen: soft, nondistended  Musculoskeletal: perfused, no clubbing   Data Reviewed: Basic Metabolic Panel:  Recent Labs Lab 07/05/14 0018 07/05/14 0510 07/06/14 0345 07/07/14 0340 07/08/14 0340 07/09/14 0400  NA 136  --  138 141 140 138  K 3.2*  --  3.4* 4.2 4.4 4.2  CL 101  --  102 104 103 104  CO2 28   --  $R'28 28 28 26  'kO$ GLUCOSE 125*  --  152* 121* 130* 119*  BUN 17  --  $R'14 14 14 14  'nw$ CREATININE 0.90  --  0.76 0.79 0.74 0.75  CALCIUM 7.1*  --  7.7* 7.8* 7.9* 7.7*  MG  --  1.3* 1.8  --   --  1.4*   Liver Function Tests:  Recent Labs Lab 07/05/14 0018  AST 27  ALT 13*  ALKPHOS 64  BILITOT 0.5  PROT 5.3*  ALBUMIN 1.8*   No results for input(s): LIPASE, AMYLASE in the last 168 hours. No results for input(s): AMMONIA in the last 168 hours. CBC:  Recent Labs Lab 07/05/14 0500 07/06/14 0345 07/07/14 0340 07/08/14 0340 07/09/14 0400  WBC 7.6 7.6 8.8 7.5 7.3  NEUTROABS 5.2  --   --   --   --   HGB 12.5* 10.3* 10.9* 11.5* 11.4*  HCT 39.0 32.2* 34.2* 36.8* 36.7*  MCV 95.6 97.0 96.3 98.7 99.5  PLT 333 402* 349 383 374   Cardiac Enzymes:  Recent Labs Lab 07/04/14 1815 07/05/14 0500  CKTOTAL  --  20*  TROPONINI <0.03  --    BNP (last 3 results)  Recent Labs  07/11/2014 1546  BNP 92.8    ProBNP (last 3 results) No results for input(s): PROBNP in the last 8760 hours.  CBG:  Recent Labs Lab 07/08/14 1609 07/08/14 2201 07/09/14 0810 07/09/14 1156 07/09/14 1620  GLUCAP 101* 130* 120* 123*  127*    Recent Results (from the past 240 hour(s))  Culture, blood (x 2)     Status: None   Collection Time: 07/01/14  7:05 PM  Result Value Ref Range Status   Specimen Description BLOOD LEFT HAND  Final   Special Requests BOTTLES DRAWN AEROBIC ONLY [place]  Final   Culture   Final    NO GROWTH 5 DAYS Performed at [facility]    Report Status 07/07/2014 FINAL  Final  Culture, blood (x 2)     Status: None   Collection Time: 07/01/14  7:20 PM  Result Value Ref Range Status   Specimen Description BLOOD LEFT ARM  Final   Special Requests BOTTLES DRAWN AEROBIC AND ANAEROBIC 10CC  Final   Culture   Final    NO GROWTH 5 DAYS Performed at [facility]    Report Status 07/07/2014 FINAL  Final  Culture, Urine     Status: None   Collection Time: 07/06/14 11:20 PM   Result Value Ref Range Status   Specimen Description URINE, RANDOM  Final   Special Requests NONE  Final   Culture   Final    >=100,000 COLONIES/mL PSEUDOMONAS AERUGINOSA Performed at [facility]    Report Status 07/09/2014 FINAL  Final   Organism ID, Bacteria PSEUDOMONAS AERUGINOSA  Final      Susceptibility   Pseudomonas aeruginosa - MIC*    CEFTAZIDIME 4 SENSITIVE Sensitive     CIPROFLOXACIN <=0.25 SENSITIVE Sensitive     GENTAMICIN 4 INTERMEDIATE Intermediate     IMIPENEM 2 SENSITIVE Sensitive     PIP/TAZO 8 SENSITIVE Sensitive     CEFEPIME 4 SENSITIVE Sensitive     * >=100,000 COLONIES/mL PSEUDOMONAS AERUGINOSA     Studies: Ct Head W Wo Contrast  07/08/2014   CLINICAL DATA:  74 year old male with decreased responsiveness while on heparin drip. Acute pulmonary embolus. Initial encounter.  EXAM: CT HEAD WITHOUT AND WITH CONTRAST  TECHNIQUE: Contiguous axial images were obtained from the base of the skull through the vertex without and with intravenous contrast  CONTRAST:  11mL OMNIPAQUE IOHEXOL 300 MG/ML  SOLN  COMPARISON:  None.  FINDINGS: Partially calcified inspissated secretions or polyp in the posterior left nasal cavity measuring 2.7 cm AP dimension (series 3, image 9). Visualized paranasal sinuses and mastoids are clear. No acute osseous abnormality identified.  No acute orbit or scalp soft tissue findings.  Calcified atherosclerosis at the skull base. Cerebral volume is within normal limits for age. No midline shift, mass effect, or evidence of intracranial mass lesion. No ventriculomegaly. No acute intracranial hemorrhage identified. Normal for age gray-white matter differentiation. No evidence of cortically based acute infarction identified. No suspicious intracranial vascular hyperdensity. No abnormal enhancement identified.  IMPRESSION: 1. Normal for age CT appearance of the brain. 2. Partially calcified 2.7 cm posterior left nasal cavity polyp or less likely  inspissated secretions. Recommend outpatient ENT follow-up.   Electronically Signed   By: Genevie Ann M.D.   On: 07/08/2014 15:40    Scheduled Meds: . antiseptic oral rinse  7 mL Mouth Rinse q12n4p  . atenolol  100 mg Oral Daily  . atorvastatin  10 mg Oral QHS  . chlorhexidine  15 mL Mouth Rinse BID  . feeding supplement (PRO-STAT 64)  30 mL Oral TID WC  . gabapentin  100 mg Oral BID  . insulin aspart  0-20 Units Subcutaneous TID WC  . insulin aspart  0-5 Units Subcutaneous QHS  .  nystatin  5 mL Oral QID  . nystatin   Topical TID  . piperacillin-tazobactam (ZOSYN)  IV  3.375 g Intravenous 3 times per day  . sodium chloride  10-40 mL Intracatheter Q12H   Continuous Infusions: . sodium chloride Stopped (06/29/14 0806)  . sodium chloride 75 mL/hr at 07/09/14 0838  . diltiazem (CARDIZEM) infusion 5 mg/hr (07/09/14 1600)  . heparin 1,750 Units/hr (07/09/14 0600)    Principal Problem:   Hypovolemic shock in the setting of severe diarrhea Active Problems:   Atrial fibrillation with RVR   Morbid obesity   Hyperlipidemia   Diabetes mellitus type 2, noninsulin dependent   Hypotension   Acute kidney injury   Diarrhea   Dehydration, severe   Hyperkalemia   Hyponatremia   Lactic acidosis   Chronic anticoagulation-Xarelto   Permanent atrial fibrillation   Leukocytosis   Pulmonary vascular congestion   Sepsis   Pressure ulcer   Hypoalbuminemia   Normocytic anemia   Acute renal failure syndrome   Coagulopathy   FUO (fever of unknown origin)   IBD (inflammatory bowel disease)   Other noninfectious gastroenteritis   Myalgia and myositis   Polyarticular arthritis   Acute pulmonary embolism with right heart strain   Acute right-sided heart failure   Acute delirium    John Paul K  [facility] Pager [phone]. If 7PM-7AM, please contact night-coverage at [email or web address], password [facility] 07/09/2014, 7:19 PM  LOS: 14 days

## 2014-07-10 DIAGNOSIS — R197 Diarrhea, unspecified: Secondary | ICD-10-CM

## 2014-07-10 DIAGNOSIS — D72829 Elevated white blood cell count, unspecified: Secondary | ICD-10-CM

## 2014-07-10 LAB — BASIC METABOLIC PANEL
Anion gap: 8 (ref 5–15)
BUN: 13 mg/dL (ref 6–20)
CO2: 29 mmol/L (ref 22–32)
Calcium: 7.8 mg/dL — ABNORMAL LOW (ref 8.9–10.3)
Chloride: 104 mmol/L (ref 101–111)
Creatinine, Ser: 0.69 mg/dL (ref 0.61–1.24)
GFR calc Af Amer: >60 mL/min (ref >60)
GFR calc non Af Amer: >60 mL/min (ref >60)
GLUCOSE: 127 mg/dL — AB (ref 65–99)
Potassium: 3.8 mmol/L (ref 3.5–5.1)
SODIUM: 141 mmol/L (ref 135–145)

## 2014-07-10 LAB — CBC
HCT: 33.1 % — ABNORMAL LOW (ref 39.0–52.0)
HEMOGLOBIN: 10.3 g/dL — AB (ref 13.0–17.0)
MCH: 30.3 pg (ref 26.0–34.0)
MCHC: 31.1 g/dL (ref 30.0–36.0)
MCV: 97.4 fL (ref 78.0–100.0)
Platelets: 343 10*3/uL (ref 150–400)
RBC: 3.4 MIL/uL — ABNORMAL LOW (ref 4.22–5.81)
RDW: 14.1 % (ref 11.5–15.5)
WBC: 7.7 10*3/uL (ref 4.0–10.5)

## 2014-07-10 LAB — GLUCOSE, CAPILLARY
GLUCOSE-CAPILLARY: 113 mg/dL — AB (ref 65–99)
GLUCOSE-CAPILLARY: 123 mg/dL — AB (ref 65–99)
Glucose-Capillary: 103 mg/dL — ABNORMAL HIGH (ref 65–99)
Glucose-Capillary: 156 mg/dL — ABNORMAL HIGH (ref 65–99)

## 2014-07-10 LAB — HEPARIN LEVEL (UNFRACTIONATED)
HEPARIN UNFRACTIONATED: 0.32 [IU]/mL (ref 0.30–0.70)
HEPARIN UNFRACTIONATED: 0.28 [IU]/mL — AB (ref 0.30–0.70)

## 2014-07-10 MED ORDER — LABETALOL HCL 5 MG/ML IV SOLN
5.0000 mg | INTRAVENOUS | Status: DC | PRN
  Administered 2014-07-12: 5 mg via INTRAVENOUS
  Filled 2014-07-10 (×2): qty 4

## 2014-07-10 MED ORDER — MAGNESIUM SULFATE 2 GM/50ML IV SOLN
2.0000 g | Freq: Once | INTRAVENOUS | Status: AC
  Administered 2014-07-10: 2 g via INTRAVENOUS
  Filled 2014-07-10: qty 50

## 2014-07-10 MED ORDER — HEPARIN (PORCINE) IN NACL 100-0.45 UNIT/ML-% IJ SOLN
2000.0000 [IU]/h | INTRAMUSCULAR | Status: DC
  Administered 2014-07-11: 2000 [IU]/h via INTRAVENOUS
  Filled 2014-07-10 (×5): qty 250

## 2014-07-10 NOTE — Progress Notes (Signed)
Nutrition Follow-up  DOCUMENTATION CODES:  Morbid obesity  INTERVENTION: - Continue Glucerna Shake po TID, each supplement provides 220 kcal and 10 grams of protein - Will d/c Prostat per wife's request - Recommend Carb Mod diet restriction (pt not eating much and is unable to tolerate sugar-free, sugar-substitute items) - RD will continue to monitor for needs  NUTRITION DIAGNOSIS:  Increased nutrient needs related to wound healing as evidenced by estimated needs. -ongoing  GOAL:  Patient will meet greater than or equal to 90% of their needs -unmet  MONITOR:  PO intake, Supplement acceptance, Weight trends, Labs, I & O's  ASSESSMENT: 74 y.o. M with PMH of DM, HTN, HLD, A.fib (on xarelto), OSA, morbid obesity. He was brought to [facility] ED 6/9 due to diarrhea x 6 days. He apparently was constipated prior to that and tried drinking more fluids and tried OTC constipation pills. The following day, he began to have diarrhea with > 5 episodes per day since then. He has had decreased PO intake during this time as he feels it worsens his diarrhea. Tried to drink fluids but has not been able to do so.  Pt sleeping at time of visit. Wife reports that pt has been sleeping most of the day each day recently and that staff has advised her not to give him anything to eat or drink unless pt fully awake. She indicates that she has been ordering different things off the menu as well as bringing items from pt's favorite restaurants but he still will only take a few bites. He does well with fruit and liquids but is unable to tolerate Prostat due to artificial sweetener; wife reports pt unable to tolerate artificial sweetener. Informed her RD would recommend diet liberalization given minimal intakes.   Per MD note: Suspect diarrhea related to ischemic insult to the bowel/colitis in the setting of large pulmonary embolism. Also of note, pt with oral thrush on Nystatin which MD indicates is improving. These  dx could be playing a role in limited intakes, when pt awake/alert enough to take PO.  With ongoing poor appetite and intakes which began PTA, >15 days ago, will monitor for GOC, POC, and need for TF to meet needs.  Not meeting needs. Medications reviewed. Labs reviewed; CBGs: 101-130 mg/dL, Ca: 7.8 mg/dL.   Height:  Ht Readings from Last 1 Encounters:  06/26/14 5\' 10"  (1.778 m)    Weight:  Wt Readings from Last 1 Encounters:  07/10/14 355 lb (161.027 kg)    Ideal Body Weight:  75.45 kg (kg)  Wt Readings from Last 10 Encounters:  07/10/14 355 lb (161.027 kg)  01/13/14 316 lb 4.8 oz (143.473 kg)  12/18/12 367 lb 6.4 oz (166.652 kg)  09/07/11 391 lb 12.8 oz (177.719 kg)  02/08/11 387 lb (175.542 kg)  08/03/10 390 lb 9.6 oz (177.175 kg)    BMI:  Body mass index is 50.94 kg/(m^2).  Estimated Nutritional Needs:  Kcal:  2300-2500  Protein:  115-125 grams  Fluid:  2.2-2.5 L/day  Skin:  Wound (see comment) (stage 2 sacral wound)  Diet Order:  Diet heart healthy/carb modified Room service appropriate?: Yes; Fluid consistency:: Thin  EDUCATION NEEDS:  Education needs addressed   Intake/Output Summary (Last 24 hours) at 07/10/14 1045 Last data filed at 07/10/14 0700  Gross per 24 hour  Intake 2606.13 ml  Output   1272 ml  Net 1334.13 ml    Last BM:  6/24    Trenton Gammon, RD, LDN Inpatient Clinical Dietitian  Pager # [phone] After hours/weekend pager # [phone]  

## 2014-07-10 NOTE — Progress Notes (Signed)
ANTICOAGULATION CONSULT NOTE - Follow Up Consult  Pharmacy Consult for Heparin Indication: pulmonary embolus  No Known Allergies  Patient Measurements: Height: 5\' 10"  (177.8 cm) Weight: (!) 355 lb (161.027 kg) IBW/kg (Calculated) : 73 Heparin Dosing Weight: 112 kg  Vital Signs: Temp: 98.3 F (36.8 C) (06/24 0400) Temp Source: Axillary (06/24 0400) BP: 108/57 mmHg (06/24 0620) Pulse Rate: 105 (06/24 0620)  Labs:  Recent Labs  07/08/14 0340  07/08/14 0521 07/08/14 0855 07/09/14 0400 07/10/14 0600  HGB 11.5*  --   --   --  11.4* 10.3*  HCT 36.8*  --   --   --  36.7* 33.1*  PLT 383  --   --   --  374 343  APTT  --   --  43* 70* 58*  --   HEPARINUNFRC  --   < > 0.86* 0.92* 0.61 0.32  CREATININE 0.74  --   --   --  0.75 0.69  < > = values in this interval not displayed.  Estimated Creatinine Clearance: 129.6 mL/min (by C-G formula based on Cr of 0.69).   Medications:  Scheduled:  . antiseptic oral rinse  7 mL Mouth Rinse q12n4p  . atenolol  100 mg Oral Daily  . atorvastatin  10 mg Oral QHS  . chlorhexidine  15 mL Mouth Rinse BID  . feeding supplement (PRO-STAT 64)  30 mL Oral TID WC  . gabapentin  100 mg Oral BID  . insulin aspart  0-John Units Subcutaneous TID WC  . insulin aspart  0-5 Units Subcutaneous QHS  . nystatin  5 mL Oral QID  . nystatin   Topical TID  . piperacillin-tazobactam (ZOSYN)  IV  3.375 g Intravenous 3 times per day  . sodium chloride  10-40 mL Intracatheter Q12H   Infusions:  . sodium chloride Stopped (06/29/14 0806)  . sodium chloride 75 mL/hr at 07/09/14 0838  . diltiazem (CARDIZEM) infusion 5 mg/hr (07/09/14 1600)  . heparin 1,750 Units/hr (07/10/14 0555)    Assessment: John Paul admitted 6/9 with diarrhea, SOB, productive cough, and fall at home.  EMS was required to assist patient after falling.  PMH includes morbid obesity, DM, HLP, HTN, OSA, and Afib on Xarelto.  Xarelto was resumed on admission.  He was incidentally found to have new PE  with right heart strain on 6/18.  Pharmacy was consulted to transition from Xarelto to Heparin.  Today, 07/10/2014:  Heparin level: 0.32, remains therapeutic at low end of goal range.  CBC: Hgb 10.3 (decerased), Plt 343  No bleeding reported or documented  Renal: SCr 0.69, CrCl > 100 ml/min   Goal of Therapy:  Heparin level 0.3-0.7 units/ml Monitor platelets by anticoagulation protocol: Yes   Plan:   Continue heparin IV infusion at 1750 units/hr  Recheck heparin level tonight to ensure level does not decrease further.  Daily heparin level and CBC  Continue to monitor H&H and platelets   F/u plan for outpatient anticoagulation for PE and afib.  Consider that morbid obesity reduces exposure of direct oral anticoagulants (thrombin, Xa inhibitors) by John-30%.  With no routine assay for monitoring therapeutic levels for these agents as there is with warfarin, it is likely neither feasible nor safe to increase dosages to account for this reduced exposure.  Lynann Beaver PharmD, BCPS Pager [phone] 07/10/2014 7:34 AM

## 2014-07-10 NOTE — Progress Notes (Signed)
   07/09/14 2100  Vitals  BP (!) 82/41 mmHg  MAP (mmHg) 55  Pulse Rate (!) 104  ECG Heart Rate (!) 106  Resp (!) 28  Oxygen Therapy  SpO2 100 %  Cardizem stopped at this time. SBP 82

## 2014-07-10 NOTE — Progress Notes (Signed)
[facility] PROGRESS NOTE  John Paul ZOX:[id number] DOB: Sep 08, 1940 DOA: 07/15/2014 PCP: Chesley Noon, MD  Assessment/Plan:  Hypovolemic shock/hypotension in the setting of severe diarrhea with lactic acidosis/severe dehydration rule out sepsis - Status post aggressive fluid volume resuscitation. - Empiric Flagyl started on admission and discontinued 06/29/14 after C. difficile studies negative. - C. difficile negative. Stool pathogen panel negative. - Given ongoing lack of improvement and concerns for occult infection, a CT of the abdomen/pelvis was obtained 07/04/14. - Suspect diarrhea related to ischemic insult to the bowel/colitis in the setting of large pulmonary embolism. -Remains stable thus far   Acute respiratory distress secondary to pulmonary vascular congestion and large right mainstem pulmonary embolism/ cardiomegaly/ right heart strain / acute systolic right heart failure - On 07/01/14, the pt developed acute respiratory distress. CXR showed cardiomegaly and pulmonary congestion. Lasix given. - CT of the chest obtained 07/04/14 with an incidentally discovered large right mainstem PE. - Xarelto was discontinued and the patient was started on a heparin drip. - Per PCCM and IR, no current role for clot dissolution. - 2-D echo done 07/05/14. EF 55-60 percent, no regional wall motion abnormality but systolic function reduced in the right ventricle.   Oral thrush - Improved on nystatin.   Atrial fibrillation with RVR / permanent atrial fibrillation on chronic anticoagulation with Xarelto - On 06/26/14, HR increased into the 130s and EKG showed atrial flutter. Patient was started on Cardizem IV. Atenolol resumed. - Was on chronic Xarelto 20 mg daily and atenolol. Atenolol was held on admission. Pt now on heparin given PE. - Cardiology consultation requested after patient developed hypotension necessitating holding atenolol. - Per cardiologist, no plans for inpatient  cardioversion. Will need follow-up post discharge. - Suspect atrial fibrillation/flutter acutely worsened rate control secondary to large PE vs infection.  - Pt intermittently continued on cardizem as of 6/21   UTI - U/A obtained 07/06/14 after heart rate elevated. Urine was cloudy with positive nitrites and moderate leukocytes, many bacteria. - Empiric Rocephin was started - Multi-drug sensitive pseudomonas was noted on urine cx - Have since continued Zosyn and changed out foley cath   Delirium  - Receiving haldol PRN secondary to delirium. - Head CT w/w/o contrast unremarkable for acute process - suspect toxic-metabolic encephalopathy secondary to infection   Hyperlipidemia - Continue statin therapy.   Severe physical deconditioning - Seen by physical therapy. SNF recommended.   Diabetes mellitus type 2, noninsulin dependent - Hemoglobin A1c 6.3%. Oral hypoglycemics on hold. - Currently on insulin resistant SSI.   Hyperkalemia / hyponatremia / hypomagnesemia / non-anion gap metabolic acidosis - Hyperkalemia, hyponatremia resolved with IV fluids. - Magnesium was replaced.   Hypocalcemia - Given 1 g of calcium gluconate 06/27/2014.   Leukocytosis / rule out sepsis - Blood cultures done 07/10/2014: Negative. - Urine cultures done 07/05/2014: Negative. - Repeat blood cultures done 07/01/14: Negative to date. - Repeat urine cultures done 07/06/14: >100,000 psudomonas   Obstructive sleep apnea / morbid obesity / hypoalbuminemia - BMI 49.07. Continue heart healthy/carbohydrate modified diet. - Dietitian evaluation done 06/26/14. - Not on C Pap. Nocturnal BiPAP started 07/01/14, however patient refused   Pressure ulcer - Evaluated by wound care nurse, continue silicone dressing over sacral area and elbow. - Continue bariatric mattress and bilateral pressure redistribution boots.   Normocytic anemia - Likely anemia of chronic disease. No current indication for transfusion.    Rule out polyarticular arthritis/inflammatory bowel disease  - ESR 130, CK low at 20. LDH normal  at 130. Follow-up autoimmune evaluation ordered by ID. - RF elevated at 14.8. The low positive predictive value of the RF casts doubt on the utility of the RF in the diagnostic evaluation of patients. Arch Intern Med. 1992 Dec;152(12):2417-20. - ANA negative. Acute hepatitis panel negative. Anti-double-stranded DNA antibody negative. - Sjogren's syndrome antibodies negative. ANCA negative. - F/U CCP,  - cryoglobulins NEG -SPEP elevated at 130, but can be seen in acute infection.   DVT Prophylaxis - Heparin gtt  Code Status: Full Family Communication: Pt in room, wife at bedside  Disposition Plan: Pending   Consultants:  ID  PCCM  IR  Procedures:    Antibiotics:  Vancomycin 06/27/2014---> 06/26/14, restarted 07/01/14---> 07/03/14  Zosyn 06/18/2014---> 06/26/14, restarted 07/01/14---> 07/03/14, restarted 6/22  Flagyl 06/29/2014---> 06/29/14  Rocephin 07/07/14--->6/22  HPI/Subjective: More interactive. Complains of generalize soreness while in bed  Objective: Filed Vitals:   07/10/14 0400 07/10/14 0620 07/10/14 0800 07/10/14 1200  BP: 111/71 108/57    Pulse: 117 105    Temp: 98.3 F (36.8 C)  97.7 F (36.5 C) 98.8 F (37.1 C)  TempSrc: Axillary  Axillary Axillary  Resp: 24 19    Height:      Weight: 161.027 kg (355 lb)     SpO2: 99% 100%      Intake/Output Summary (Last 24 hours) at 07/10/14 1408 Last data filed at 07/10/14 1348  Gross per 24 hour  Intake 2234.46 ml  Output   1000 ml  Net 1234.46 ml   Filed Weights   07/08/14 0500 07/09/14 0500 07/10/14 0400  Weight: 164 kg (361 lb 8.9 oz) 163.9 kg (361 lb 5.3 oz) 161.027 kg (355 lb)    Exam:   General:  Awake, laying in bed, in nad  Cardiovascular: regular, tachycardic, s1, s2  Respiratory: normal resp effort, no wheezing  Abdomen: soft, nondistended, perfused  Musculoskeletal: perfused, no clubbing   Data  Reviewed: Basic Metabolic Panel:  Recent Labs Lab 07/05/14 0510 07/06/14 0345 07/07/14 0340 07/08/14 0340 07/09/14 0400 07/10/14 0600  NA  --  138 141 140 138 141  K  --  3.4* 4.2 4.4 4.2 3.8  CL  --  102 104 103 104 104  CO2  --  _0 GLUCOSE  --  152* 121* 130* 119* 127*  BUN  --  _1 CREATININE  --  0.76 0.79 0.74 0.75 0.69  CALCIUM  --  7.7* 7.8* 7.9* 7.7* 7.8*  MG 1.3* 1.8  --   --  1.4*  --    Liver Function Tests:  Recent Labs Lab 07/05/14 0018  AST 27  ALT 13*  ALKPHOS 64  BILITOT 0.5  PROT 5.3*  ALBUMIN 1.8*   No results for input(s): LIPASE, AMYLASE in the last 168 hours. No results for input(s): AMMONIA in the last 168 hours. CBC:  Recent Labs Lab 07/05/14 0500 07/06/14 0345 07/07/14 0340 07/08/14 0340 07/09/14 0400 07/10/14 0600  WBC 7.6 7.6 8.8 7.5 7.3 7.7  NEUTROABS 5.2  --   --   --   --   --   HGB 12.5* 10.3* 10.9* 11.5* 11.4* 10.3*  HCT 39.0 32.2* 34.2* 36.8* 36.7* 33.1*  MCV 95.6 97.0 96.3 98.7 99.5 97.4  PLT 333 402* 349 383 374 343   Cardiac Enzymes:  Recent Labs Lab 07/04/14 1815 07/05/14 0500  CKTOTAL  --  20*  TROPONINI <0.03  --    BNP (last 3 results)  Recent Labs  06/21/2014 1546  BNP 92.8    ProBNP (last 3 results) No results for input(s): PROBNP in the last 8760 hours.  CBG:  Recent Labs Lab 07/09/14 0810 07/09/14 1156 07/09/14 1620 07/10/14 0734 07/10/14 1153  GLUCAP 120* 123* 127* 103* 156*    Recent Results (from the past 240 hour(s))  Culture, blood (x 2)     Status: None   Collection Time: 07/01/14  7:05 PM  Result Value Ref Range Status   Specimen Description BLOOD LEFT HAND  Final   Special Requests BOTTLES DRAWN AEROBIC ONLY [place]  Final   Culture   Final    NO GROWTH 5 DAYS Performed at [facility]    Report Status 07/07/2014 FINAL  Final  Culture, blood (x 2)     Status: None   Collection Time: 07/01/14  7:20 PM  Result Value Ref Range Status   Specimen  Description BLOOD LEFT ARM  Final   Special Requests BOTTLES DRAWN AEROBIC AND ANAEROBIC 10CC  Final   Culture   Final    NO GROWTH 5 DAYS Performed at [facility]    Report Status 07/07/2014 FINAL  Final  Culture, Urine     Status: None   Collection Time: 07/06/14 11:20 PM  Result Value Ref Range Status   Specimen Description URINE, RANDOM  Final   Special Requests NONE  Final   Culture   Final    >=100,000 COLONIES/mL PSEUDOMONAS AERUGINOSA Performed at [facility]    Report Status 07/09/2014 FINAL  Final   Organism ID, Bacteria PSEUDOMONAS AERUGINOSA  Final      Susceptibility   Pseudomonas aeruginosa - MIC*    CEFTAZIDIME 4 SENSITIVE Sensitive     CIPROFLOXACIN <=0.25 SENSITIVE Sensitive     GENTAMICIN 4 INTERMEDIATE Intermediate     IMIPENEM 2 SENSITIVE Sensitive     PIP/TAZO 8 SENSITIVE Sensitive     CEFEPIME 4 SENSITIVE Sensitive     * >=100,000 COLONIES/mL PSEUDOMONAS AERUGINOSA     Studies: Ct Head W Wo Contrast  07/08/2014   CLINICAL DATA:  74 year old male with decreased responsiveness while on heparin drip. Acute pulmonary embolus. Initial encounter.  EXAM: CT HEAD WITHOUT AND WITH CONTRAST  TECHNIQUE: Contiguous axial images were obtained from the base of the skull through the vertex without and with intravenous contrast  CONTRAST:  187m OMNIPAQUE IOHEXOL 300 MG/ML  SOLN  COMPARISON:  None.  FINDINGS: Partially calcified inspissated secretions or polyp in the posterior left nasal cavity measuring 2.7 cm AP dimension (series 3, image 9). Visualized paranasal sinuses and mastoids are clear. No acute osseous abnormality identified.  No acute orbit or scalp soft tissue findings.  Calcified atherosclerosis at the skull base. Cerebral volume is within normal limits for age. No midline shift, mass effect, or evidence of intracranial mass lesion. No ventriculomegaly. No acute intracranial hemorrhage identified. Normal for age gray-white matter differentiation. No  evidence of cortically based acute infarction identified. No suspicious intracranial vascular hyperdensity. No abnormal enhancement identified.  IMPRESSION: 1. Normal for age CT appearance of the brain. 2. Partially calcified 2.7 cm posterior left nasal cavity polyp or less likely inspissated secretions. Recommend outpatient ENT follow-up.   Electronically Signed   By: HGenevie AnnM.D.   On: 07/08/2014 15:40    Scheduled Meds: . antiseptic oral rinse  7 mL Mouth Rinse q12n4p  . atenolol  100 mg Oral Daily  . atorvastatin  10 mg Oral QHS  . chlorhexidine  15 mL Mouth Rinse BID  . gabapentin  100 mg Oral BID  . insulin aspart  0-20 Units Subcutaneous TID WC  . insulin aspart  0-5 Units Subcutaneous QHS  . nystatin  5 mL Oral QID  . nystatin   Topical TID  . piperacillin-tazobactam (ZOSYN)  IV  3.375 g Intravenous 3 times per day  . sodium chloride  10-40 mL Intracatheter Q12H   Continuous Infusions: . sodium chloride Stopped (06/29/14 0806)  . sodium chloride 75 mL/hr at 07/09/14 0838  . diltiazem (CARDIZEM) infusion 5 mg/hr (07/09/14 1600)  . heparin 1,750 Units/hr (07/10/14 1357)    Principal Problem:   Hypovolemic shock in the setting of severe diarrhea Active Problems:   Atrial fibrillation with RVR   Morbid obesity   Hyperlipidemia   Diabetes mellitus type 2, noninsulin dependent   Hypotension   Acute kidney injury   Diarrhea   Dehydration, severe   Hyperkalemia   Hyponatremia   Lactic acidosis   Chronic anticoagulation-Xarelto   Permanent atrial fibrillation   Leukocytosis   Pulmonary vascular congestion   Sepsis   Pressure ulcer   Hypoalbuminemia   Normocytic anemia   Acute renal failure syndrome   Coagulopathy   FUO (fever of unknown origin)   IBD (inflammatory bowel disease)   Other noninfectious gastroenteritis   Myalgia and myositis   Polyarticular arthritis   Acute pulmonary embolism with right heart strain   Acute right-sided heart failure   Acute  delirium    Khaleed Holan K  [facility] Pager [phone]. If 7PM-7AM, please contact night-coverage at [email or web address], password [facility] 07/10/2014, 2:08 PM  LOS: 15 days

## 2014-07-10 NOTE — Progress Notes (Signed)
Pt refuses BIPAP QHS, RT to monitor and assess as needed.  

## 2014-07-10 NOTE — Progress Notes (Signed)
ANTICOAGULATION CONSULT NOTE - Follow Up Consult  Pharmacy Consult for Heparin Indication: pulmonary embolus  No Known Allergies  Patient Measurements: Height: 5\' 10"  (177.8 cm) Weight: (!) 355 lb (161.027 kg) IBW/kg (Calculated) : 73 Heparin Dosing Weight: 112 kg  Vital Signs: Temp: 98.1 F (36.7 C) (06/24 2000) Temp Source: Axillary (06/24 2000) BP: 122/76 mmHg (06/24 2000) Pulse Rate: 97 (06/24 2000)  Labs:  Recent Labs  07/08/14 0340  07/08/14 0521 07/08/14 0855 07/09/14 0400 07/10/14 0600 07/10/14 2045  HGB 11.5*  --   --   --  11.4* 10.3*  --   HCT 36.8*  --   --   --  36.7* 33.1*  --   PLT 383  --   --   --  374 343  --   APTT  --   --  43* 70* 58*  --   --   HEPARINUNFRC  --   < > 0.86* 0.92* 0.61 0.32 0.28*  CREATININE 0.74  --   --   --  0.75 0.69  --   < > = values in this interval not displayed.  Estimated Creatinine Clearance: 129.6 mL/min (by C-G formula based on Cr of 0.69).   Medications:  Scheduled:  . antiseptic oral rinse  7 mL Mouth Rinse q12n4p  . atenolol  100 mg Oral Daily  . atorvastatin  10 mg Oral QHS  . chlorhexidine  15 mL Mouth Rinse BID  . gabapentin  100 mg Oral BID  . insulin aspart  0-20 Units Subcutaneous TID WC  . insulin aspart  0-5 Units Subcutaneous QHS  . nystatin  5 mL Oral QID  . nystatin   Topical TID  . piperacillin-tazobactam (ZOSYN)  IV  3.375 g Intravenous 3 times per day  . sodium chloride  10-40 mL Intracatheter Q12H   Infusions:  . sodium chloride 10 mL/hr at 07/10/14 2000  . sodium chloride 75 mL/hr at 07/09/14 0838  . diltiazem (CARDIZEM) infusion 5 mg/hr (07/09/14 1600)  . heparin 1,750 Units/hr (07/10/14 1700)    Assessment: 73 yoM admitted 6/9 with diarrhea, SOB, productive cough, and fall at home.  EMS was required to assist patient after falling.  PMH includes morbid obesity, DM, HLP, HTN, OSA, and Afib on Xarelto.  Xarelto was resumed on admission.  He was incidentally found to have new PE with  right heart strain on 6/18.  Pharmacy was consulted to transition from Xarelto to Heparin.  Today, 07/10/2014:  Heparin level now subtherapeutic at current rate of 1750 units/hr  No bleeding reported or documented per RN   Goal of Therapy:  Heparin level 0.3-0.7 units/ml Monitor platelets by anticoagulation protocol: Yes   Plan:   Increase IV heparin to rate of 2000 units/hr  Recheck heparin level with AM labs.  Daily heparin level and CBC  Continue to monitor H&H and platelets   F/u plan for outpatient anticoagulation for PE and afib.  Consider that morbid obesity reduces exposure of direct oral anticoagulants (thrombin, Xa inhibitors) by 20-30%.  With no routine assay for monitoring therapeutic levels for these agents as there is with warfarin, it is likely neither feasible nor safe to increase dosages to account for this reduced exposure.   Hessie Knows, PharmD, BCPS Pager [phone] 07/10/2014 9:15 PM

## 2014-07-11 DIAGNOSIS — R509 Fever, unspecified: Secondary | ICD-10-CM

## 2014-07-11 LAB — BASIC METABOLIC PANEL
Anion gap: 7 (ref 5–15)
BUN: 11 mg/dL (ref 6–20)
CALCIUM: 7.6 mg/dL — AB (ref 8.9–10.3)
CHLORIDE: 104 mmol/L (ref 101–111)
CO2: 29 mmol/L (ref 22–32)
Creatinine, Ser: 0.67 mg/dL (ref 0.61–1.24)
GFR calc Af Amer: >60 mL/min (ref >60)
GFR calc non Af Amer: >60 mL/min (ref >60)
Glucose, Bld: 123 mg/dL — ABNORMAL HIGH (ref 65–99)
POTASSIUM: 3.7 mmol/L (ref 3.5–5.1)
SODIUM: 140 mmol/L (ref 135–145)

## 2014-07-11 LAB — CBC
HCT: 35.1 % — ABNORMAL LOW (ref 39.0–52.0)
HEMOGLOBIN: 11 g/dL — AB (ref 13.0–17.0)
MCH: 30.9 pg (ref 26.0–34.0)
MCHC: 31.3 g/dL (ref 30.0–36.0)
MCV: 98.6 fL (ref 78.0–100.0)
PLATELETS: 395 10*3/uL (ref 150–400)
RBC: 3.56 MIL/uL — AB (ref 4.22–5.81)
RDW: 14.2 % (ref 11.5–15.5)
WBC: 7.7 10*3/uL (ref 4.0–10.5)

## 2014-07-11 LAB — GLUCOSE, CAPILLARY
GLUCOSE-CAPILLARY: 118 mg/dL — AB (ref 65–99)
Glucose-Capillary: 129 mg/dL — ABNORMAL HIGH (ref 65–99)
Glucose-Capillary: 96 mg/dL (ref 65–99)

## 2014-07-11 LAB — HEPARIN LEVEL (UNFRACTIONATED)
Heparin Unfractionated: 0.41 IU/mL (ref 0.30–0.70)
Heparin Unfractionated: 0.4 IU/mL (ref 0.30–0.70)

## 2014-07-11 LAB — MAGNESIUM: MAGNESIUM: 1.7 mg/dL (ref 1.7–2.4)

## 2014-07-11 NOTE — Progress Notes (Signed)
ANTICOAGULATION CONSULT NOTE - Follow Up Consult  Pharmacy Consult for Heparin Indication: pulmonary embolus  No Known Allergies  Patient Measurements: Height: 5\' 10"  (177.8 cm) Weight: (!) 362 lb (164.202 kg) IBW/kg (Calculated) : 73 Heparin Dosing Weight:   Vital Signs: Temp: 98.7 F (37.1 C) (06/25 0400) Temp Source: Axillary (06/25 0000) BP: 110/66 mmHg (06/25 0600) Pulse Rate: 107 (06/25 0600)  Labs:  Recent Labs  07/08/14 0855  07/09/14 0400 07/10/14 0600 07/10/14 2045 07/11/14 0545  HGB  --   < > 11.4* 10.3*  --  11.0*  HCT  --   --  36.7* 33.1*  --  35.1*  PLT  --   --  374 343  --  395  APTT 70*  --  58*  --   --   --   HEPARINUNFRC 0.92*  --  0.61 0.32 0.28* 0.41  CREATININE  --   --  0.75 0.69  --  0.67  < > = values in this interval not displayed.  Estimated Creatinine Clearance: 131.2 mL/min (by C-G formula based on Cr of 0.67).   Medications:  Infusions:  . sodium chloride 10 mL/hr at 07/11/14 0430  . sodium chloride 75 mL/hr at 07/11/14 0430  . diltiazem (CARDIZEM) infusion 5 mg/hr (07/09/14 1600)  . heparin 2,000 Units/hr (07/11/14 0300)    Assessment: Patient with heparin level at goal.  No heparin issues noted.  Goal of Therapy:  Heparin level 0.3-0.7 units/ml Monitor platelets by anticoagulation protocol: Yes   Plan:  Continue heparin drip at current rate Recheck level at 1200  [address], [place] 07/11/2014,6:48 AM

## 2014-07-11 NOTE — Progress Notes (Signed)
[facility] PROGRESS NOTE  John Paul YJE:[id number] DOB: 10-26-1940 DOA: 07/06/2014 PCP: Chesley Noon, MD  Assessment/Plan:  Hypovolemic shock/hypotension in the setting of severe diarrhea with lactic acidosis/severe dehydration rule out sepsis - Status post aggressive fluid volume resuscitation. - Empiric Flagyl started on admission and discontinued 06/29/14 after C. difficile studies negative. - C. difficile negative. Stool pathogen panel negative. - Given ongoing lack of improvement and concerns for occult infection, a CT of the abdomen/pelvis was obtained 07/04/14. - Suspect diarrhea related to ischemic insult to the bowel/colitis in the setting of large pulmonary embolism. - Improved   Acute respiratory distress secondary to pulmonary vascular congestion and large right mainstem pulmonary embolism/ cardiomegaly/ right heart strain / acute systolic right heart failure - On 07/01/14, the pt developed acute respiratory distress. CXR showed cardiomegaly and pulmonary congestion. Lasix given. - CT of the chest obtained 07/04/14 with an incidentally discovered large right mainstem PE. - Xarelto was discontinued and the patient was started on a heparin drip. - Per PCCM and IR, no current role for clot dissolution. - 2-D echo done 07/05/14. EF 55-60 percent, no regional wall motion abnormality but systolic function reduced in the right ventricle.   Oral thrush - Improved on nystatin.   Atrial fibrillation with RVR / permanent atrial fibrillation on chronic anticoagulation with Xarelto - On 06/26/14, HR increased into the 130s and EKG showed atrial flutter. Patient was started on Cardizem IV. Atenolol resumed. - Was on chronic Xarelto 20 mg daily and atenolol. Atenolol was held on admission. Pt now on heparin given PE. - Cardiology consultation requested after patient developed hypotension necessitating holding atenolol. - Per cardiologist, no plans for inpatient cardioversion.  Will need follow-up post discharge. - Suspect atrial fibrillation/flutter acutely worsened rate control secondary to large PE vs infection.  - Pt had required cardizem gtt intermittently - HR improved and pt remains off cardizem gtt   UTI - U/A obtained 07/06/14 after heart rate elevated. Urine was cloudy with positive nitrites and moderate leukocytes, many bacteria. - Empiric Rocephin was started - Multi-drug sensitive pseudomonas was noted on urine cx - Have since continued Zosyn and changed out foley cath   Delirium  - Receiving haldol PRN secondary to delirium. - Head CT w/w/o contrast unremarkable for acute process - suspect toxic-metabolic encephalopathy secondary to infection   Hyperlipidemia - Continue statin therapy.   Severe physical deconditioning - Seen by physical therapy. SNF recommended.   Diabetes mellitus type 2, noninsulin dependent - Hemoglobin A1c 6.3%. Oral hypoglycemics on hold. - Currently on insulin resistant SSI.   Hyperkalemia / hyponatremia / hypomagnesemia / non-anion gap metabolic acidosis - Hyperkalemia, hyponatremia resolved with IV fluids. - Magnesium was replaced.   Hypocalcemia - Given 1 g of calcium gluconate 07/05/2014.   Leukocytosis / rule out sepsis - Blood cultures done 07/15/2014: Negative. - Urine cultures done 06/27/2014: Negative. - Repeat blood cultures done 07/01/14: Negative to date. - Repeat urine cultures done 07/06/14: >100,000 pseudomonas, multidrug sensitive   Obstructive sleep apnea / morbid obesity / hypoalbuminemia - BMI 49.07. Continue heart healthy/carbohydrate modified diet. - Dietitian evaluation done 06/26/14. - Not on C Pap. Nocturnal BiPAP started 07/01/14, however patient refused   Pressure ulcer - Pt was evaluated by wound care nurse, continue silicone dressing over sacral area and elbow. - Continue bariatric mattress and bilateral pressure redistribution boots.   Normocytic anemia - Likely anemia of chronic  disease. No current indication for transfusion.   Rule out polyarticular arthritis/inflammatory bowel disease  -  ESR 130, CK low at 20. LDH normal at 130. Follow-up autoimmune evaluation ordered by ID. - RF elevated at 14.8. The low positive predictive value of the RF casts doubt on the utility of the RF in the diagnostic evaluation of patients. Arch Intern Med. 1992 Dec;152(12):2417-20. - ANA negative. Acute hepatitis panel negative. Anti-double-stranded DNA antibody negative. - Sjogren's syndrome antibodies negative. ANCA negative. - F/U CCP,  - cryoglobulins NEG -SPEP elevated at 130, but can be seen in acute infection.   DVT Prophylaxis - Heparin gtt  Code Status: Full Family Communication: Pt in room, wife at bedside  Disposition Plan: Pending   Consultants:  ID  PCCM  IR  Procedures:    Antibiotics:  Vancomycin 06/23/2014---> 06/26/14, restarted 07/01/14---> 07/03/14  Zosyn 06/21/2014---> 06/26/14, restarted 07/01/14---> 07/03/14, restarted 6/22  Flagyl 07/07/2014---> 06/29/14  Rocephin 07/07/14--->6/22  HPI/Subjective: More interactive. Complains of generalize soreness while in bed  Objective: Filed Vitals:   07/11/14 0757 07/11/14 0800 07/11/14 1000 07/11/14 1200  BP:  99/62 112/86 98/52  Pulse:  108 96 93  Temp: 98 F (36.7 C)   98.2 F (36.8 C)  TempSrc: Oral   Oral  Resp:  $Remo'26 16 19  'TYhLE$ Height:      Weight:      SpO2:  100% 100% 100%    Intake/Output Summary (Last 24 hours) at 07/11/14 1354 Last data filed at 07/11/14 1300  Gross per 24 hour  Intake 3095.04 ml  Output   1065 ml  Net 2030.04 ml   Filed Weights   07/09/14 0500 07/10/14 0400 07/11/14 0500  Weight: 163.9 kg (361 lb 5.3 oz) 161.027 kg (355 lb) 164.202 kg (362 lb)    Exam:   General:  Awake, laying in bed, in nad  Cardiovascular: regular, tachycardic, s1, s2  Respiratory: normal resp effort, no wheezing  Abdomen: soft, nondistended, perfused  Musculoskeletal: perfused, no clubbing    Data Reviewed: Basic Metabolic Panel:  Recent Labs Lab 07/05/14 0510 07/06/14 0345 07/07/14 0340 07/08/14 0340 07/09/14 0400 07/10/14 0600 07/11/14 0545  NA  --  138 141 140 138 141 140  K  --  3.4* 4.2 4.4 4.2 3.8 3.7  CL  --  102 104 103 104 104 104  CO2  --  $R'28 28 28 26 29 29  'Uq$ GLUCOSE  --  152* 121* 130* 119* 127* 123*  BUN  --  $R'14 14 14 14 13 11  'jE$ CREATININE  --  0.76 0.79 0.74 0.75 0.69 0.67  CALCIUM  --  7.7* 7.8* 7.9* 7.7* 7.8* 7.6*  MG 1.3* 1.8  --   --  1.4*  --  1.7   Liver Function Tests:  Recent Labs Lab 07/05/14 0018  AST 27  ALT 13*  ALKPHOS 64  BILITOT 0.5  PROT 5.3*  ALBUMIN 1.8*   No results for input(s): LIPASE, AMYLASE in the last 168 hours. No results for input(s): AMMONIA in the last 168 hours. CBC:  Recent Labs Lab 07/05/14 0500  07/07/14 0340 07/08/14 0340 07/09/14 0400 07/10/14 0600 07/11/14 0545  WBC 7.6  < > 8.8 7.5 7.3 7.7 7.7  NEUTROABS 5.2  --   --   --   --   --   --   HGB 12.5*  < > 10.9* 11.5* 11.4* 10.3* 11.0*  HCT 39.0  < > 34.2* 36.8* 36.7* 33.1* 35.1*  MCV 95.6  < > 96.3 98.7 99.5 97.4 98.6  PLT 333  < > 349 383 374 343 395  < > =  values in this interval not displayed. Cardiac Enzymes:  Recent Labs Lab 07/04/14 1815 07/05/14 0500  CKTOTAL  --  20*  TROPONINI <0.03  --    BNP (last 3 results)  Recent Labs  07/09/2014 1546  BNP 92.8    ProBNP (last 3 results) No results for input(s): PROBNP in the last 8760 hours.  CBG:  Recent Labs Lab 07/10/14 1153 07/10/14 1550 07/10/14 1934 07/11/14 0743 07/11/14 1157  GLUCAP 156* 113* 123* 118* 129*    Recent Results (from the past 240 hour(s))  Culture, blood (x 2)     Status: None   Collection Time: 07/01/14  7:05 PM  Result Value Ref Range Status   Specimen Description BLOOD LEFT HAND  Final   Special Requests BOTTLES DRAWN AEROBIC ONLY [place]  Final   Culture   Final    NO GROWTH 5 DAYS Performed at [facility]    Report Status 07/07/2014  FINAL  Final  Culture, blood (x 2)     Status: None   Collection Time: 07/01/14  7:20 PM  Result Value Ref Range Status   Specimen Description BLOOD LEFT ARM  Final   Special Requests BOTTLES DRAWN AEROBIC AND ANAEROBIC 10CC  Final   Culture   Final    NO GROWTH 5 DAYS Performed at [facility]    Report Status 07/07/2014 FINAL  Final  Culture, Urine     Status: None   Collection Time: 07/06/14 11:20 PM  Result Value Ref Range Status   Specimen Description URINE, RANDOM  Final   Special Requests NONE  Final   Culture   Final    >=100,000 COLONIES/mL PSEUDOMONAS AERUGINOSA Performed at [facility]    Report Status 07/09/2014 FINAL  Final   Organism ID, Bacteria PSEUDOMONAS AERUGINOSA  Final      Susceptibility   Pseudomonas aeruginosa - MIC*    CEFTAZIDIME 4 SENSITIVE Sensitive     CIPROFLOXACIN <=0.25 SENSITIVE Sensitive     GENTAMICIN 4 INTERMEDIATE Intermediate     IMIPENEM 2 SENSITIVE Sensitive     PIP/TAZO 8 SENSITIVE Sensitive     CEFEPIME 4 SENSITIVE Sensitive     * >=100,000 COLONIES/mL PSEUDOMONAS AERUGINOSA     Studies: No results found.  Scheduled Meds: . antiseptic oral rinse  7 mL Mouth Rinse q12n4p  . atenolol  100 mg Oral Daily  . atorvastatin  10 mg Oral QHS  . chlorhexidine  15 mL Mouth Rinse BID  . gabapentin  100 mg Oral BID  . insulin aspart  0-20 Units Subcutaneous TID WC  . insulin aspart  0-5 Units Subcutaneous QHS  . nystatin   Topical TID  . piperacillin-tazobactam (ZOSYN)  IV  3.375 g Intravenous 3 times per day  . sodium chloride  10-40 mL Intracatheter Q12H   Continuous Infusions: . sodium chloride 10 mL/hr at 07/11/14 0430  . sodium chloride 75 mL/hr at 07/11/14 0430  . diltiazem (CARDIZEM) infusion 5 mg/hr (07/09/14 1600)  . heparin 2,000 Units/hr (07/11/14 0300)    Principal Problem:   Hypovolemic shock in the setting of severe diarrhea Active Problems:   Atrial fibrillation with RVR   Morbid obesity    Hyperlipidemia   Diabetes mellitus type 2, noninsulin dependent   Hypotension   Acute kidney injury   Diarrhea   Dehydration, severe   Hyperkalemia   Hyponatremia   Lactic acidosis   Chronic anticoagulation-Xarelto   Permanent atrial fibrillation   Leukocytosis   Pulmonary vascular  congestion   Sepsis   Pressure ulcer   Hypoalbuminemia   Normocytic anemia   Acute renal failure syndrome   Coagulopathy   FUO (fever of unknown origin)   IBD (inflammatory bowel disease)   Other noninfectious gastroenteritis   Myalgia and myositis   Polyarticular arthritis   Acute pulmonary embolism with right heart strain   Acute right-sided heart failure   Acute delirium    CH[facility], [facility] Pager [phone]. If 7PM-7AM, please contact night-coverage at [email or web address], password [facility] 07/11/2014, 1:54 PM  LOS: 16 days

## 2014-07-11 NOTE — Progress Notes (Signed)
ANTICOAGULATION CONSULT NOTE - Follow Up Consult  Pharmacy Consult for Heparin Indication: pulmonary embolus  No Known Allergies  Patient Measurements: Height: 5\' 10"  (177.8 cm) Weight: (!) 362 lb (164.202 kg) IBW/kg (Calculated) : 73 Heparin Dosing Weight: 113 kg  Vital Signs: Temp: 98 F (36.7 C) (06/25 0757) Temp Source: Oral (06/25 0757) BP: 98/52 mmHg (06/25 1200) Pulse Rate: 93 (06/25 1200)  Labs:  Recent Labs  07/09/14 0400 07/10/14 0600 07/10/14 2045 07/11/14 0545 07/11/14 1210  HGB 11.4* 10.3*  --  11.0*  --   HCT 36.7* 33.1*  --  35.1*  --   PLT 374 343  --  395  --   APTT 58*  --   --   --   --   HEPARINUNFRC 0.61 0.32 0.28* 0.41 0.40  CREATININE 0.75 0.69  --  0.67  --     Estimated Creatinine Clearance: 131.2 mL/min (by C-G formula based on Cr of 0.67).   Medications:  Scheduled:  . antiseptic oral rinse  7 mL Mouth Rinse q12n4p  . atenolol  100 mg Oral Daily  . atorvastatin  10 mg Oral QHS  . chlorhexidine  15 mL Mouth Rinse BID  . gabapentin  100 mg Oral BID  . insulin aspart  0-20 Units Subcutaneous TID WC  . insulin aspart  0-5 Units Subcutaneous QHS  . nystatin   Topical TID  . piperacillin-tazobactam (ZOSYN)  IV  3.375 g Intravenous 3 times per day  . sodium chloride  10-40 mL Intracatheter Q12H   Infusions:  . sodium chloride 10 mL/hr at 07/11/14 0430  . sodium chloride 75 mL/hr at 07/11/14 0430  . diltiazem (CARDIZEM) infusion 5 mg/hr (07/09/14 1600)  . heparin 2,000 Units/hr (07/11/14 0300)    Assessment: 73 yoM admitted 6/9 with diarrhea, SOB, productive cough, and fall at home.  EMS was required to assist patient after falling.  PMH includes morbid obesity, DM, HLP, HTN, OSA, and Afib on Xarelto.  Xarelto was resumed on admission.  He was incidentally found to have new PE with right heart strain on 6/18.  Pharmacy was consulted to transition from Xarelto to Heparin.  Today, 07/11/2014:  Heparin level: 0.4, remains  therapeutic  CBC: Hgb 11 (low/stable), Plt 395  No bleeding reported or documented  Renal: SCr 0.67, CrCl > 100 ml/min   Goal of Therapy:  Heparin level 0.3-0.7 units/ml Monitor platelets by anticoagulation protocol: Yes   Plan:   Continue heparin IV infusion at 2000 units/hr  Daily heparin level and CBC  Continue to monitor H&H and platelets   F/u plan for outpatient anticoagulation for PE and afib.  Consider that morbid obesity reduces exposure of direct oral anticoagulants (thrombin, Xa inhibitors) by 20-30%.  With no routine assay for monitoring therapeutic levels for these agents as there is with warfarin, it is likely neither feasible nor safe to increase dosages to account for this reduced exposure.  Lynann Beaver PharmD, BCPS Pager [phone] 07/11/2014 12:50 PM

## 2014-07-11 NOTE — Progress Notes (Signed)
Pt refuses BIPAP QHS.  RT to monitor and assess as needed.  

## 2014-07-12 LAB — GLUCOSE, CAPILLARY
GLUCOSE-CAPILLARY: 99 mg/dL (ref 65–99)
Glucose-Capillary: 112 mg/dL — ABNORMAL HIGH (ref 65–99)
Glucose-Capillary: 132 mg/dL — ABNORMAL HIGH (ref 65–99)
Glucose-Capillary: 106 mg/dL — ABNORMAL HIGH (ref 65–99)
Glucose-Capillary: 98 mg/dL (ref 65–99)

## 2014-07-12 LAB — BASIC METABOLIC PANEL
Anion gap: 7 (ref 5–15)
BUN: 11 mg/dL (ref 6–20)
CALCIUM: 7.7 mg/dL — AB (ref 8.9–10.3)
CHLORIDE: 105 mmol/L (ref 101–111)
CO2: 29 mmol/L (ref 22–32)
CREATININE: 0.72 mg/dL (ref 0.61–1.24)
GFR calc Af Amer: >60 mL/min (ref >60)
GFR calc non Af Amer: >60 mL/min (ref >60)
Glucose, Bld: 135 mg/dL — ABNORMAL HIGH (ref 65–99)
POTASSIUM: 3.7 mmol/L (ref 3.5–5.1)
Sodium: 141 mmol/L (ref 135–145)

## 2014-07-12 LAB — HEPARIN LEVEL (UNFRACTIONATED)
HEPARIN UNFRACTIONATED: 0.29 [IU]/mL — AB (ref 0.30–0.70)
Heparin Unfractionated: 0.38 IU/mL (ref 0.30–0.70)

## 2014-07-12 LAB — CBC
HCT: 34.4 % — ABNORMAL LOW (ref 39.0–52.0)
HEMOGLOBIN: 10.8 g/dL — AB (ref 13.0–17.0)
MCH: 30.9 pg (ref 26.0–34.0)
MCHC: 31.4 g/dL (ref 30.0–36.0)
MCV: 98.6 fL (ref 78.0–100.0)
Platelets: 358 10*3/uL (ref 150–400)
RBC: 3.49 MIL/uL — ABNORMAL LOW (ref 4.22–5.81)
RDW: 14.3 % (ref 11.5–15.5)
WBC: 6 10*3/uL (ref 4.0–10.5)

## 2014-07-12 MED ORDER — HEPARIN (PORCINE) IN NACL 100-0.45 UNIT/ML-% IJ SOLN
1900.0000 [IU]/h | INTRAMUSCULAR | Status: DC
  Administered 2014-07-12 – 2014-07-14 (×4): 2250 [IU]/h via INTRAVENOUS
  Administered 2014-07-15: 2000 [IU]/h via INTRAVENOUS
  Filled 2014-07-12 (×8): qty 250

## 2014-07-12 NOTE — Progress Notes (Signed)
ANTICOAGULATION CONSULT NOTE - Follow Up Consult  Pharmacy Consult for Heparin Indication: pulmonary embolus  Assessment: 73 yoM on heparin infusion for PE.  Heparin level slightly subtherapeutic this AM and rate increased.  See pharmacist note from earlier today for further detail.    This evening:  Heparin level therapeutic at 0.38 with infusion at 2250 units/hr.  No bleeding reported or documented; no infusion interruptions or complications.  Goal of Therapy:  Heparin level 0.3-0.7 units/ml Monitor platelets by anticoagulation protocol: Yes   Plan:  Continue heparin infusion at 2250 units/hr. Repeat heparin level in 8 hours to confirm rate.  Clance Boll, PharmD, BCPS Pager: [phone] 07/12/2014 5:20 PM

## 2014-07-12 NOTE — Progress Notes (Signed)
[facility] PROGRESS NOTE  John Paul YJE:[id number] DOB: February 13, 1940 DOA: 07/15/2014 PCP: Chesley Noon, MD  Assessment/Plan:  Hypovolemic shock/hypotension in the setting of severe diarrhea with lactic acidosis/severe dehydration rule out sepsis - Status post aggressive fluid volume resuscitation. - Empiric Flagyl started on admission and discontinued 06/29/14 after C. difficile studies negative. - C. difficile negative. Stool pathogen panel negative. - Given ongoing lack of improvement and concerns for occult infection, a CT of the abdomen/pelvis was obtained 07/04/14. - Suspect diarrhea related to ischemic insult to the bowel/colitis in the setting of large pulmonary embolism. - Improved   Acute respiratory distress secondary to pulmonary vascular congestion and large right mainstem pulmonary embolism/ cardiomegaly/ right heart strain / acute systolic right heart failure - On 07/01/14, the pt developed acute respiratory distress. CXR showed cardiomegaly and pulmonary congestion. Lasix given. - CT of the chest obtained 07/04/14 with an incidentally discovered large right mainstem PE. - Xarelto was discontinued and the patient was started on a heparin drip. - Per PCCM and IR, no current role for clot dissolution. - 2-D echo done 07/05/14. EF 55-60 percent, no regional wall motion abnormality but systolic function reduced in the right ventricle.   Oral thrush - Improved on nystatin.   Atrial fibrillation with RVR / permanent atrial fibrillation on chronic anticoagulation with Xarelto - On 06/26/14, HR increased into the 130s and EKG showed atrial flutter. Patient was started on Cardizem IV. Atenolol resumed. - Was on chronic Xarelto 20 mg daily and atenolol. Atenolol was held on admission. Pt now on heparin given PE. - Cardiology consultation requested after patient developed hypotension necessitating holding atenolol. - Per cardiologist, no plans for inpatient cardioversion.  Will need follow-up post discharge. - Suspect atrial fibrillation/flutter acutely worsened rate control secondary to large PE vs infection.  - Pt had required cardizem gtt intermittently - HR has since improved and pt remains off cardizem gtt   UTI - U/A obtained 07/06/14 after heart rate elevated. Urine was cloudy with positive nitrites and moderate leukocytes, many bacteria. - Empiric Rocephin was started - Multi-drug sensitive pseudomonas was noted on urine cx - Have since continued Zosyn and changed out foley cath   Delirium  - Received haldol PRN secondary to delirium. - Head CT w/w/o contrast unremarkable for acute process - Suspect toxic-metabolic encephalopathy secondary to infection - also consider ICU delerium - Pt seems more confused today. Day/night cycle seems reversed   Hyperlipidemia - Continue statin therapy.   Severe physical deconditioning - Seen by physical therapy. SNF recommended.   Diabetes mellitus type 2, noninsulin dependent - Hemoglobin A1c 6.3%. Oral hypoglycemics on hold. - Currently on insulin resistant SSI.   Hyperkalemia / hyponatremia / hypomagnesemia / non-anion gap metabolic acidosis - Hyperkalemia, hyponatremia resolved with IV fluids. - Magnesium was replaced.   Hypocalcemia - Given 1 g of calcium gluconate 06/19/2014.   Leukocytosis / rule out sepsis - Blood cultures done 06/23/2014: Negative. - Urine cultures done 07/04/2014: Negative. - Repeat blood cultures done 07/01/14: Negative to date. - Repeat urine cultures done 07/06/14: >100,000 pseudomonas, multidrug sensitive   Obstructive sleep apnea / morbid obesity / hypoalbuminemia - BMI 49.07. Continue heart healthy/carbohydrate modified diet. - Dietitian evaluation done 06/26/14. - Not on C Pap. Nocturnal BiPAP started 07/01/14, however patient refused   Pressure ulcer - Pt was evaluated by wound care nurse, continue silicone dressing over sacral area and elbow. - Continue bariatric  mattress and bilateral pressure redistribution boots.   Normocytic anemia - Likely anemia  of chronic disease. No current indication for transfusion.   Rule out polyarticular arthritis/inflammatory bowel disease  - ESR 130, CK low at 20. LDH normal at 130. Follow-up autoimmune evaluation ordered by ID. - RF elevated at 14.8. The low positive predictive value of the RF casts doubt on the utility of the RF in the diagnostic evaluation of patients. Arch Intern Med. 1992 Dec;152(12):2417-20. - ANA negative. Acute hepatitis panel negative. Anti-double-stranded DNA antibody negative. - Sjogren's syndrome antibodies negative. ANCA negative. - F/U CCP,  - cryoglobulins NEG -SPEP elevated at 130, but can be seen in acute infection.   DVT Prophylaxis - Heparin gtt  Code Status: Full Family Communication: Pt in room, wife at bedside  Disposition Plan: Pending   Consultants:  ID  PCCM  IR  Procedures:    Antibiotics:  Vancomycin 06/23/2014---> 06/26/14, restarted 07/01/14---> 07/03/14  Zosyn 07/04/2014---> 06/26/14, restarted 07/01/14---> 07/03/14, restarted 6/22  Flagyl 07/15/2014---> 06/29/14  Rocephin 07/07/14--->6/22  HPI/Subjective: More interactive. Complains of generalize soreness while in bed  Objective: Filed Vitals:   07/12/14 0000 07/12/14 0429 07/12/14 0700 07/12/14 0800  BP: 102/60 90/69  93/60  Pulse: 52 110  117  Temp: 97.9 F (36.6 C)     TempSrc: Oral     Resp: $Remo'17 24  25  'aZRdR$ Height:      Weight:   166.017 kg (366 lb)   SpO2: 100% 93%  94%    Intake/Output Summary (Last 24 hours) at 07/12/14 0829 Last data filed at 07/12/14 0800  Gross per 24 hour  Intake   2540 ml  Output    775 ml  Net   1765 ml   Filed Weights   07/10/14 0400 07/11/14 0500 07/12/14 0700  Weight: 161.027 kg (355 lb) 164.202 kg (362 lb) 166.017 kg (366 lb)    Exam:   General:  Awake, laying in bed, in nad  Cardiovascular: regular, tachycardic, s1, s2  Respiratory: normal resp effort,  no wheezing  Abdomen: soft, nondistended, perfused  Musculoskeletal: perfused, no clubbing   Data Reviewed: Basic Metabolic Panel:  Recent Labs Lab 07/06/14 0345  07/08/14 0340 07/09/14 0400 07/10/14 0600 07/11/14 0545 07/12/14 0445  NA 138  < > 140 138 141 140 141  K 3.4*  < > 4.4 4.2 3.8 3.7 3.7  CL 102  < > 103 104 104 104 105  CO2 28  < > $R'28 26 29 29 29  'vU$ GLUCOSE 152*  < > 130* 119* 127* 123* 135*  BUN 14  < > $R'14 14 13 11 11  'Mb$ CREATININE 0.76  < > 0.74 0.75 0.69 0.67 0.72  CALCIUM 7.7*  < > 7.9* 7.7* 7.8* 7.6* 7.7*  MG 1.8  --   --  1.4*  --  1.7  --   < > = values in this interval not displayed. Liver Function Tests: No results for input(s): AST, ALT, ALKPHOS, BILITOT, PROT, ALBUMIN in the last 168 hours. No results for input(s): LIPASE, AMYLASE in the last 168 hours. No results for input(s): AMMONIA in the last 168 hours. CBC:  Recent Labs Lab 07/08/14 0340 07/09/14 0400 07/10/14 0600 07/11/14 0545 07/12/14 0445  WBC 7.5 7.3 7.7 7.7 6.0  HGB 11.5* 11.4* 10.3* 11.0* 10.8*  HCT 36.8* 36.7* 33.1* 35.1* 34.4*  MCV 98.7 99.5 97.4 98.6 98.6  PLT 383 374 343 395 358   Cardiac Enzymes: No results for input(s): CKTOTAL, CKMB, CKMBINDEX, TROPONINI in the last 168 hours. BNP (last 3 results)  Recent Labs  07/11/2014 1546  BNP 92.8    ProBNP (last 3 results) No results for input(s): PROBNP in the last 8760 hours.  CBG:  Recent Labs Lab 07/10/14 1934 07/11/14 0743 07/11/14 1157 07/11/14 1600 07/11/14 2106  GLUCAP 123* 118* 129* 96 99    Recent Results (from the past 240 hour(s))  Culture, Urine     Status: None   Collection Time: 07/06/14 11:20 PM  Result Value Ref Range Status   Specimen Description URINE, RANDOM  Final   Special Requests NONE  Final   Culture   Final    >=100,000 COLONIES/mL PSEUDOMONAS AERUGINOSA Performed at [facility]    Report Status 07/09/2014 FINAL  Final   Organism ID, Bacteria PSEUDOMONAS AERUGINOSA  Final       Susceptibility   Pseudomonas aeruginosa - MIC*    CEFTAZIDIME 4 SENSITIVE Sensitive     CIPROFLOXACIN <=0.25 SENSITIVE Sensitive     GENTAMICIN 4 INTERMEDIATE Intermediate     IMIPENEM 2 SENSITIVE Sensitive     PIP/TAZO 8 SENSITIVE Sensitive     CEFEPIME 4 SENSITIVE Sensitive     * >=100,000 COLONIES/mL PSEUDOMONAS AERUGINOSA     Studies: No results found.  Scheduled Meds: . antiseptic oral rinse  7 mL Mouth Rinse q12n4p  . atenolol  100 mg Oral Daily  . atorvastatin  10 mg Oral QHS  . chlorhexidine  15 mL Mouth Rinse BID  . gabapentin  100 mg Oral BID  . insulin aspart  0-20 Units Subcutaneous TID WC  . insulin aspart  0-5 Units Subcutaneous QHS  . nystatin   Topical TID  . piperacillin-tazobactam (ZOSYN)  IV  3.375 g Intravenous 3 times per day  . sodium chloride  10-40 mL Intracatheter Q12H   Continuous Infusions: . sodium chloride 10 mL/hr at 07/11/14 0430  . sodium chloride 75 mL/hr at 07/11/14 1809  . diltiazem (CARDIZEM) infusion 5 mg/hr (07/09/14 1600)  . heparin 2,250 Units/hr (07/12/14 0740)    Principal Problem:   Hypovolemic shock in the setting of severe diarrhea Active Problems:   Atrial fibrillation with RVR   Morbid obesity   Hyperlipidemia   Diabetes mellitus type 2, noninsulin dependent   Hypotension   Acute kidney injury   Diarrhea   Dehydration, severe   Hyperkalemia   Hyponatremia   Lactic acidosis   Chronic anticoagulation-Xarelto   Permanent atrial fibrillation   Leukocytosis   Pulmonary vascular congestion   Sepsis   Pressure ulcer   Hypoalbuminemia   Normocytic anemia   Acute renal failure syndrome   Coagulopathy   FUO (fever of unknown origin)   IBD (inflammatory bowel disease)   Other noninfectious gastroenteritis   Myalgia and myositis   Polyarticular arthritis   Acute pulmonary embolism with right heart strain   Acute right-sided heart failure   Acute delirium    Axl Rodino K  [facility] Pager [phone]. If  7PM-7AM, please contact night-coverage at [email or web address], password [facility] 07/12/2014, 8:29 AM  LOS: 17 days

## 2014-07-12 NOTE — Progress Notes (Signed)
ANTICOAGULATION CONSULT NOTE - Follow Up Consult  Pharmacy Consult for Heparin Indication: pulmonary embolus  No Known Allergies  Patient Measurements: Height: 5\' 10"  (177.8 cm) Weight: (!) 362 lb (164.202 kg) IBW/kg (Calculated) : 73 Heparin Dosing Weight: 113 kg  Vital Signs: Temp: 97.9 F (36.6 C) (06/26 0000) Temp Source: Oral (06/26 0000) BP: 90/69 mmHg (06/26 0429) Pulse Rate: 110 (06/26 0429)  Labs:  Recent Labs  07/10/14 0600  07/11/14 0545 07/11/14 1210 07/12/14 0445  HGB 10.3*  --  11.0*  --  10.8*  HCT 33.1*  --  35.1*  --  34.4*  PLT 343  --  395  --  358  HEPARINUNFRC 0.32  < > 0.41 0.40 0.29*  CREATININE 0.69  --  0.67  --  0.72  < > = values in this interval not displayed.  Estimated Creatinine Clearance: 131.2 mL/min (by C-G formula based on Cr of 0.72).   Medications:  Scheduled:  . antiseptic oral rinse  7 mL Mouth Rinse q12n4p  . atenolol  100 mg Oral Daily  . atorvastatin  10 mg Oral QHS  . chlorhexidine  15 mL Mouth Rinse BID  . gabapentin  100 mg Oral BID  . insulin aspart  0-20 Units Subcutaneous TID WC  . insulin aspart  0-5 Units Subcutaneous QHS  . nystatin   Topical TID  . piperacillin-tazobactam (ZOSYN)  IV  3.375 g Intravenous 3 times per day  . sodium chloride  10-40 mL Intracatheter Q12H   Infusions:  . sodium chloride 10 mL/hr at 07/11/14 0430  . sodium chloride 75 mL/hr at 07/11/14 1809  . diltiazem (CARDIZEM) infusion 5 mg/hr (07/09/14 1600)  . heparin 2,000 Units/hr (07/12/14 0400)    Assessment: 49 yoM admitted 6/9 with diarrhea, SOB, productive cough, and fall at home.  EMS was required to assist patient after falling.  PMH includes morbid obesity, DM, HLP, HTN, OSA, and Afib on Xarelto.  Xarelto was resumed on admission.  He was incidentally found to have new PE with right heart strain on 6/18.  Pharmacy was consulted to transition from Xarelto to Heparin.  Today, 07/12/2014:  Heparin level: 0.29, decreased to  subtherapeutic  CBC: Hgb 10.8 (low/stable), Plt 358  No bleeding reported or documented; no infusion interruptions or complications.  Renal: SCr 0.72, CrCl > 100 ml/min   Goal of Therapy:  Heparin level 0.3-0.7 units/ml Monitor platelets by anticoagulation protocol: Yes   Plan:   Increase to heparin IV infusion at 2250 units/hr  Heparin level 8 hours after rate change  Daily heparin level and CBC  Continue to monitor H&H and platelets  F/u plan for outpatient anticoagulation for PE and afib.    Lynann Beaver PharmD, BCPS Pager [phone] 07/12/2014 7:06 AM

## 2014-07-12 NOTE — Progress Notes (Signed)
Pt refused Bipap for tonight. Pt remains on 3Lpm nasal cannula SpO2 97%. RT will continue to monitor as needed.

## 2014-07-13 ENCOUNTER — Inpatient Hospital Stay (HOSPITAL_COMMUNITY): Payer: Medicare HMO

## 2014-07-13 DIAGNOSIS — J189 Pneumonia, unspecified organism: Secondary | ICD-10-CM

## 2014-07-13 DIAGNOSIS — A419 Sepsis, unspecified organism: Secondary | ICD-10-CM

## 2014-07-13 DIAGNOSIS — E785 Hyperlipidemia, unspecified: Secondary | ICD-10-CM

## 2014-07-13 LAB — GLUCOSE, CAPILLARY
GLUCOSE-CAPILLARY: 141 mg/dL — AB (ref 65–99)
GLUCOSE-CAPILLARY: 121 mg/dL — AB (ref 65–99)
Glucose-Capillary: 100 mg/dL — ABNORMAL HIGH (ref 65–99)
Glucose-Capillary: 113 mg/dL — ABNORMAL HIGH (ref 65–99)
Glucose-Capillary: 136 mg/dL — ABNORMAL HIGH (ref 65–99)

## 2014-07-13 LAB — CBC
HEMATOCRIT: 34.8 % — AB (ref 39.0–52.0)
Hemoglobin: 10.9 g/dL — ABNORMAL LOW (ref 13.0–17.0)
MCH: 30.5 pg (ref 26.0–34.0)
MCHC: 31.3 g/dL (ref 30.0–36.0)
MCV: 97.5 fL (ref 78.0–100.0)
PLATELETS: 355 10*3/uL (ref 150–400)
RBC: 3.57 MIL/uL — ABNORMAL LOW (ref 4.22–5.81)
RDW: 14.4 % (ref 11.5–15.5)
WBC: 7.1 10*3/uL (ref 4.0–10.5)

## 2014-07-13 LAB — COMPREHENSIVE METABOLIC PANEL
ALBUMIN: 1.6 g/dL — AB (ref 3.5–5.0)
ALK PHOS: 74 U/L (ref 38–126)
ALT: 13 U/L — ABNORMAL LOW (ref 17–63)
AST: 17 U/L (ref 15–41)
Anion gap: 8 (ref 5–15)
BILIRUBIN TOTAL: 0.4 mg/dL (ref 0.3–1.2)
BUN: 10 mg/dL (ref 6–20)
CO2: 27 mmol/L (ref 22–32)
CREATININE: 0.73 mg/dL (ref 0.61–1.24)
Calcium: 7.4 mg/dL — ABNORMAL LOW (ref 8.9–10.3)
Chloride: 104 mmol/L (ref 101–111)
GFR calc Af Amer: >60 mL/min (ref >60)
GFR calc non Af Amer: >60 mL/min (ref >60)
Glucose, Bld: 107 mg/dL — ABNORMAL HIGH (ref 65–99)
POTASSIUM: 3.8 mmol/L (ref 3.5–5.1)
Sodium: 139 mmol/L (ref 135–145)
TOTAL PROTEIN: 4.7 g/dL — AB (ref 6.5–8.1)

## 2014-07-13 LAB — BLOOD GAS, ARTERIAL
ACID-BASE DEFICIT: 0.3 mmol/L (ref 0.0–2.0)
BICARBONATE: 23.5 meq/L (ref 20.0–24.0)
Drawn by: 246861
FIO2: 0.21 %
O2 Saturation: 90.4 %
PCO2 ART: 37.2 mmHg (ref 35.0–45.0)
PH ART: 7.416 (ref 7.350–7.450)
Patient temperature: 98.6
TCO2: 21.2 mmol/L (ref 0–100)
pO2, Arterial: 60.1 mmHg — ABNORMAL LOW (ref 80.0–100.0)

## 2014-07-13 LAB — HEPARIN LEVEL (UNFRACTIONATED): HEPARIN UNFRACTIONATED: 0.48 [IU]/mL (ref 0.30–0.70)

## 2014-07-13 MED ORDER — VANCOMYCIN HCL 10 G IV SOLR
1500.0000 mg | Freq: Two times a day (BID) | INTRAVENOUS | Status: DC
  Administered 2014-07-14 – 2014-07-15 (×3): 1500 mg via INTRAVENOUS
  Filled 2014-07-13 (×4): qty 1500

## 2014-07-13 MED ORDER — VANCOMYCIN HCL 10 G IV SOLR
2000.0000 mg | Freq: Once | INTRAVENOUS | Status: AC
  Administered 2014-07-13: 2000 mg via INTRAVENOUS
  Filled 2014-07-13: qty 2000

## 2014-07-13 NOTE — Progress Notes (Signed)
[facility] PROGRESS NOTE  John Paul QHK:[id number] DOB: Jun 01, 1940 DOA: 07/06/2014 PCP: Eartha Inch, MD  Assessment/Plan:  Hypovolemic shock/hypotension in the setting of severe diarrhea with lactic acidosis/severe dehydration rule out sepsis - Status post aggressive fluid volume resuscitation. - Empiric Flagyl was started on admission and later dscontinued 06/29/14 after C. difficile studies were found to be negative. - Stool pathogen panel was found to be negative. - Given ongoing lack of improvement and concerns for occult infection, a CT of the abdomen/pelvis was obtained 07/04/14. - Suspect diarrhea related to ischemic insult to the bowel/colitis in the setting of large pulmonary embolism. - Dehydration improved   Acute respiratory distress secondary to pulmonary vascular congestion and large right mainstem pulmonary embolism/ cardiomegaly/ right heart strain / acute systolic right heart failure - On 07/01/14, the pt developed acute respiratory distress. CXR showed cardiomegaly and pulmonary congestion. Lasix given. - CT of the chest obtained 07/04/14 with an incidentally discovered large right mainstem PE. - Xarelto was discontinued and the patient was started on a heparin drip. - Per PCCM and IR, no current role for clot dissolution. - 2-D echo done 07/05/14. EF 55-60 percent, no regional wall motion abnormality but systolic function reduced in the right ventricle.   Oral thrush - Improved with nystatin.   Atrial fibrillation with RVR / permanent atrial fibrillation on chronic anticoagulation with Xarelto - On 06/26/14, HR increased into the 130s and EKG showed atrial flutter. Patient was started on Cardizem IV. Atenolol resumed. - Was on chronic Xarelto 20 mg daily and atenolol. Atenolol was held on admission. Pt now on heparin given PE. - Cardiology consultation was requested after patient developed hypotension necessitating holding atenolol. - Per cardiologist, no  plans for inpatient cardioversion. Will need follow-up post discharge. - Suspect atrial fibrillation/flutter acutely worsened rate control secondary to large PE vs infection.  - Pt had required cardizem gtt intermittently - HR has since improved and pt remains off cardizem gtt   UTI - U/A obtained 07/06/14 after heart rate elevated. Urine was cloudy with positive nitrites and moderate leukocytes, many bacteria. - Empiric Rocephin was started - Multi-drug sensitive pseudomonas was noted on urine cx - Have since continued Zosyn and changed out foley cath   Delirium  - Received haldol PRN secondary to delirium. - Head CT w/w/o contrast unremarkable for acute process - Suspect toxic-metabolic encephalopathy secondary to infection - Confusion continues to worsen. Suspect from active infection.   Hyperlipidemia - Continue statin therapy.   Severe physical deconditioning - Was seen by physical therapy. SNF was recommended.   Diabetes mellitus type 2, noninsulin dependent - Hemoglobin A1c 6.3%. Oral hypoglycemics on hold. - Currently on insulin resistant SSI.   Hyperkalemia / hyponatremia / hypomagnesemia / non-anion gap metabolic acidosis - Hyperkalemia, hyponatremia resolved with IV fluids. - Magnesium was replaced.   Hypocalcemia - Given 1 g of calcium gluconate 07/01/2014.   Leukocytosis / rule out sepsis - Blood cultures done 07/07/2014: Negative. - Urine cultures done 07/07/2014: Negative. - Repeat blood cultures done 07/01/14: Negative to date. - Repeat urine cultures done 07/06/14: >100,000 pseudomonas, multidrug sensitive   Obstructive sleep apnea / morbid obesity / hypoalbuminemia - BMI 49.07. Continue heart healthy/carbohydrate modified diet. - Dietitian evaluation done 06/26/14. - Not on C Pap. Nocturnal BiPAP started 07/01/14, however patient refused   Pressure ulcer - Pt was evaluated by wound care nurse, continue silicone dressing over sacral area and elbow. -  Continue bariatric mattress and bilateral pressure redistribution boots.  Normocytic anemia - Likely anemia of chronic disease. No current indication for transfusion.   Rule out polyarticular arthritis/inflammatory bowel disease  - ESR 130, CK low at 20. LDH normal at 130. Follow-up autoimmune evaluation ordered by ID. - RF elevated at 14.8. The low positive predictive value of the RF casts doubt on the utility of the RF in the diagnostic evaluation of patients. Arch Intern Med. 1992 Dec;152(12):2417-20. - ANA negative. Acute hepatitis panel negative. Anti-double-stranded DNA antibody negative. - Sjogren's syndrome antibodies negative. ANCA negative. - F/U CCP,  - cryoglobulins NEG -SPEP elevated at 130, but can be seen in acute infection.   DVT Prophylaxis - Heparin gtt  HCAP with sepsis - Over the past several days, pt with increased respirations, tachycardia, mental status change - Obtained CXR on 6/27 with findings of new LLL opacity concerning for PNA with small L pleural effusion - Already on zosyn for pseudomonas UTI. Will add vancomycin to cover HCAP - Will repeat blood cultures  Code Status: Full Family Communication: Pt in room, wife at bedside  Disposition Plan: discussed with CM - consideration for LTAC   Consultants:  ID  PCCM  IR  Procedures:    Antibiotics:  Vancomycin 07/09/2014---> 06/26/14, restarted 07/01/14---> 07/03/14, restarted 6/27>>>  Zosyn 06/26/2014---> 06/26/14, restarted 07/01/14---> 07/03/14, restarted 6/22>>>  Flagyl 07/13/2014---> 06/29/14  Rocephin 07/07/14--->6/22  HPI/Subjective: Eager to go home and not nursing home.   Objective: Filed Vitals:   07/13/14 0500 07/13/14 0518 07/13/14 0600 07/13/14 0730  BP:  95/51  93/38  Pulse: 123 123 123 123  Temp:  98.8 F (37.1 C)    TempSrc:  Axillary    Resp: $Remo'23 29 27 25  'kAFLB$ Height:      Weight: 162.841 kg (359 lb)     SpO2: 85% 90% 90% 91%    Intake/Output Summary (Last 24 hours) at 07/13/14  0738 Last data filed at 07/13/14 0730  Gross per 24 hour  Intake 2637.5 ml  Output    710 ml  Net 1927.5 ml   Filed Weights   07/11/14 0500 07/12/14 0700 07/13/14 0500  Weight: 164.202 kg (362 lb) 166.017 kg (366 lb) 162.841 kg (359 lb)    Exam:   General:  Awake, laying in bed, conversant, in nad  Cardiovascular: regular, tachycardic, s1, s2  Respiratory: normal resp effort, no wheezing  Abdomen: soft, nondistended, perfused  Musculoskeletal: perfused, no clubbing, no cyanosis  Data Reviewed: Basic Metabolic Panel:  Recent Labs Lab 07/09/14 0400 07/10/14 0600 07/11/14 0545 07/12/14 0445 07/13/14 0500  NA 138 141 140 141 139  K 4.2 3.8 3.7 3.7 3.8  CL 104 104 104 105 104  CO2 $Re'26 29 29 29 27  'sZg$ GLUCOSE 119* 127* 123* 135* 107*  BUN $Re'14 13 11 11 10  'HhX$ CREATININE 0.75 0.69 0.67 0.72 0.73  CALCIUM 7.7* 7.8* 7.6* 7.7* 7.4*  MG 1.4*  --  1.7  --   --    Liver Function Tests:  Recent Labs Lab 07/13/14 0500  AST 17  ALT 13*  ALKPHOS 74  BILITOT 0.4  PROT 4.7*  ALBUMIN 1.6*   No results for input(s): LIPASE, AMYLASE in the last 168 hours. No results for input(s): AMMONIA in the last 168 hours. CBC:  Recent Labs Lab 07/09/14 0400 07/10/14 0600 07/11/14 0545 07/12/14 0445 07/13/14 0500  WBC 7.3 7.7 7.7 6.0 7.1  HGB 11.4* 10.3* 11.0* 10.8* 10.9*  HCT 36.7* 33.1* 35.1* 34.4* 34.8*  MCV 99.5 97.4 98.6 98.6 97.5  PLT  374 343 395 358 355   Cardiac Enzymes: No results for input(s): CKTOTAL, CKMB, CKMBINDEX, TROPONINI in the last 168 hours. BNP (last 3 results)  Recent Labs  07/03/2014 1546  BNP 92.8    ProBNP (last 3 results) No results for input(s): PROBNP in the last 8760 hours.  CBG:  Recent Labs Lab 07/11/14 2106 07/12/14 0813 07/12/14 1131 07/12/14 1616 07/12/14 2207  GLUCAP 99 112* 132* 106* 98    Recent Results (from the past 240 hour(s))  Culture, Urine     Status: None   Collection Time: 07/06/14 11:20 PM  Result Value Ref Range  Status   Specimen Description URINE, RANDOM  Final   Special Requests NONE  Final   Culture   Final    >=100,000 COLONIES/mL PSEUDOMONAS AERUGINOSA Performed at [facility]    Report Status 07/09/2014 FINAL  Final   Organism ID, Bacteria PSEUDOMONAS AERUGINOSA  Final      Susceptibility   Pseudomonas aeruginosa - MIC*    CEFTAZIDIME 4 SENSITIVE Sensitive     CIPROFLOXACIN <=0.25 SENSITIVE Sensitive     GENTAMICIN 4 INTERMEDIATE Intermediate     IMIPENEM 2 SENSITIVE Sensitive     PIP/TAZO 8 SENSITIVE Sensitive     CEFEPIME 4 SENSITIVE Sensitive     * >=100,000 COLONIES/mL PSEUDOMONAS AERUGINOSA     Studies: No results found.  Scheduled Meds: . antiseptic oral rinse  7 mL Mouth Rinse q12n4p  . atenolol  100 mg Oral Daily  . atorvastatin  10 mg Oral QHS  . chlorhexidine  15 mL Mouth Rinse BID  . gabapentin  100 mg Oral BID  . insulin aspart  0-20 Units Subcutaneous TID WC  . insulin aspart  0-5 Units Subcutaneous QHS  . nystatin   Topical TID  . piperacillin-tazobactam (ZOSYN)  IV  3.375 g Intravenous 3 times per day  . sodium chloride  10-40 mL Intracatheter Q12H   Continuous Infusions: . sodium chloride Stopped (07/12/14 0600)  . sodium chloride 1,000 mL (07/12/14 2137)  . diltiazem (CARDIZEM) infusion 5 mg/hr (07/09/14 1600)  . heparin 2,250 Units/hr (07/13/14 0327)    Principal Problem:   Hypovolemic shock in the setting of severe diarrhea Active Problems:   Atrial fibrillation with RVR   Morbid obesity   Hyperlipidemia   Diabetes mellitus type 2, noninsulin dependent   Hypotension   Acute kidney injury   Diarrhea   Dehydration, severe   Hyperkalemia   Hyponatremia   Lactic acidosis   Chronic anticoagulation-Xarelto   Permanent atrial fibrillation   Leukocytosis   Pulmonary vascular congestion   Sepsis   Pressure ulcer   Hypoalbuminemia   Normocytic anemia   Acute renal failure syndrome   Coagulopathy   FUO (fever of unknown origin)   IBD  (inflammatory bowel disease)   Other noninfectious gastroenteritis   Myalgia and myositis   Polyarticular arthritis   Acute pulmonary embolism with right heart strain   Acute right-sided heart failure   Acute delirium    Brytni Dray K  [facility] Pager [phone]. If 7PM-7AM, please contact night-coverage at [email or web address], password [facility] 07/13/2014, 7:38 AM  LOS: 18 days

## 2014-07-13 NOTE — Progress Notes (Signed)
ANTIBIOTIC CONSULT NOTE - INITIAL  Pharmacy Consult for vancomycin Indication:Pneumonia  No Known Allergies  Patient Measurements: Height: 5\' 10"  (177.8 cm) Weight: (!) 359 lb (162.841 kg) IBW/kg (Calculated) : 73   Vital Signs: Temp: 98.3 F (36.8 C) (06/27 0800) Temp Source: Axillary (06/27 0800) BP: 104/63 mmHg (06/27 1000) Pulse Rate: 124 (06/27 1000) Intake/Output from previous day: 06/26 0701 - 06/27 0700 In: 2562.5 [P.O.:360; I.V.:2027.5; IV Piggyback:175] Out: 710 [Urine:710] Intake/Output from this shift: Total I/O In: 390 [I.V.:390] Out: 45 [Urine:45]  Labs:  Recent Labs  07/11/14 0545 07/12/14 0445 07/13/14 0500  WBC 7.7 6.0 7.1  HGB 11.0* 10.8* 10.9*  PLT 395 358 355  CREATININE 0.67 0.72 0.73   Estimated Creatinine Clearance: 130.5 mL/min (by C-G formula based on Cr of 0.73). No results for input(s): VANCOTROUGH, VANCOPEAK, VANCORANDOM, GENTTROUGH, GENTPEAK, GENTRANDOM, TOBRATROUGH, TOBRAPEAK, TOBRARND, AMIKACINPEAK, AMIKACINTROU, AMIKACIN in the last 72 hours.   Microbiology: Recent Results (from the past 720 hour(s))  Blood culture (routine x 2)     Status: None   Collection Time: 2014-07-06  3:28 PM  Result Value Ref Range Status   Specimen Description BLOOD RIGHT ARM  Final   Special Requests BOTTLES DRAWN AEROBIC AND ANAEROBIC 5CC EACH  Final   Culture   Final    NO GROWTH 5 DAYS Performed at [facility]    Report Status 07/01/2014 FINAL  Final  Blood culture (routine x 2)     Status: None   Collection Time: 07-06-14  3:29 PM  Result Value Ref Range Status   Specimen Description BLOOD LEFT ARM  Final   Special Requests BOTTLES DRAWN AEROBIC AND ANAEROBIC 5CC EACH  Final   Culture   Final    NO GROWTH 5 DAYS Performed at [facility]    Report Status 07/01/2014 FINAL  Final  Urine culture     Status: None   Collection Time: 07-06-14  5:46 PM  Result Value Ref Range Status   Specimen Description Urine  Final   Special  Requests Normal  Final   Colony Count NO GROWTH Performed at [facility]   Final   Culture NO GROWTH Performed at [facility]   Final   Report Status 06/26/2014 FINAL  Final  MRSA PCR Screening     Status: None   Collection Time: 07/06/14 10:01 PM  Result Value Ref Range Status   MRSA by PCR NEGATIVE NEGATIVE Final    Comment:        The GeneXpert MRSA Assay (FDA approved for NASAL specimens only), is one component of a comprehensive MRSA colonization surveillance program. It is not intended to diagnose MRSA infection nor to guide or monitor treatment for MRSA infections.   Culture, Urine     Status: None   Collection Time: 06/26/14  1:45 AM  Result Value Ref Range Status   Specimen Description URINE, CATHETERIZED  Final   Special Requests NONE  Final   Colony Count NO GROWTH Performed at [facility]   Final   Culture NO GROWTH Performed at [facility]   Final   Report Status 06/27/2014 FINAL  Final  Culture, Urine     Status: None   Collection Time: 06/27/14 11:51 AM  Result Value Ref Range Status   Specimen Description URINE, CATHETERIZED  Final   Special Requests NONE  Final   Colony Count NO GROWTH Performed at [facility]   Final   Culture NO GROWTH Performed  at [facility]   Final   Report Status 06/28/2014 FINAL  Final  Culture, blood (routine x 2)     Status: None   Collection Time: 06/27/14 12:47 PM  Result Value Ref Range Status   Specimen Description BLOOD LEFT ARM  Final   Special Requests BOTTLES DRAWN AEROBIC ONLY 10CC AEROBIC ONLY  Final   Culture   Final    NO GROWTH 5 DAYS Performed at [facility]    Report Status 07/03/2014 FINAL  Final  Culture, blood (routine x 2)     Status: None   Collection Time: 06/27/14 12:54 PM  Result Value Ref Range Status   Specimen Description BLOOD LEFT ARM  Final   Special Requests   Final    BOTTLES DRAWN AEROBIC AND ANAEROBIC 10CC BOTH BOTTLES    Culture   Final    NO GROWTH 5 DAYS Performed at [facility]    Report Status 07/03/2014 FINAL  Final  Clostridium Difficile by PCR (not at [facility])     Status: None   Collection Time: 06/27/14  6:00 PM  Result Value Ref Range Status   C difficile by pcr NEGATIVE NEGATIVE Final  Culture, blood (x 2)     Status: None   Collection Time: 07/01/14  7:05 PM  Result Value Ref Range Status   Specimen Description BLOOD LEFT HAND  Final   Special Requests BOTTLES DRAWN AEROBIC ONLY 6CC  Final   Culture   Final    NO GROWTH 5 DAYS Performed at [facility]    Report Status 07/07/2014 FINAL  Final  Culture, blood (x 2)     Status: None   Collection Time: 07/01/14  7:20 PM  Result Value Ref Range Status   Specimen Description BLOOD LEFT ARM  Final   Special Requests BOTTLES DRAWN AEROBIC AND ANAEROBIC 10CC  Final   Culture   Final    NO GROWTH 5 DAYS Performed at [facility]    Report Status 07/07/2014 FINAL  Final  Culture, Urine     Status: None   Collection Time: 07/06/14 11:20 PM  Result Value Ref Range Status   Specimen Description URINE, RANDOM  Final   Special Requests NONE  Final   Culture   Final    >=100,000 COLONIES/mL PSEUDOMONAS AERUGINOSA Performed at [facility]    Report Status 07/09/2014 FINAL  Final   Organism ID, Bacteria PSEUDOMONAS AERUGINOSA  Final      Susceptibility   Pseudomonas aeruginosa - MIC*    CEFTAZIDIME 4 SENSITIVE Sensitive     CIPROFLOXACIN <=0.25 SENSITIVE Sensitive     GENTAMICIN 4 INTERMEDIATE Intermediate     IMIPENEM 2 SENSITIVE Sensitive     PIP/TAZO 8 SENSITIVE Sensitive     CEFEPIME 4 SENSITIVE Sensitive     * >=100,000 COLONIES/mL PSEUDOMONAS AERUGINOSA    Medical History: Past Medical History  Diagnosis Date  . Diabetes mellitus   . Hypertension   . Hyperlipidemia   . Morbid obesity     WITH HYPOVENTILATION  . OSA (obstructive sleep apnea)   . Atrial fibrillation   . Acute pulmonary embolism  with right heart strain 07/05/2014    Medications:  Scheduled:  . antiseptic oral rinse  7 mL Mouth Rinse q12n4p  . atenolol  100 mg Oral Daily  . atorvastatin  10 mg Oral QHS  . chlorhexidine  15 mL Mouth Rinse BID  . gabapentin  100 mg Oral BID  .  insulin aspart  0-20 Units Subcutaneous TID WC  . insulin aspart  0-5 Units Subcutaneous QHS  . nystatin   Topical TID  . piperacillin-tazobactam (ZOSYN)  IV  3.375 g Intravenous 3 times per day  . sodium chloride  10-40 mL Intracatheter Q12H   Assessment: 6/9 >> Vanc >> 6/10  6/9 >> Zosyn >> 6/10  6/9 >> Flagyl >> 6/13  6/9 >> PO nystatin >>  6/15 >> vanc >> 6/17  6/15 >> Zosyn >> 6/17  6/21 >> Rocephin >> 6/22 6/22 >> zosyn >> 6/27 >>vanc >>  Temp: afebrile  WBC: wnl  Renal: SCr wnl; CrCl > 90 CG and N  6/9 MRSA PCR negative  6/9 blood x2: NGF  6/10 urine: NGF  6/9 cdiff PCR: negative  6/10 HIV antibody: neg  6/15 blood x2: NGF  6/20 urine: P. Aeruginosa (Sens to zosyn)   Goal of Therapy:  vanc per renal function and indication  Plan:  Vancomycin 2gm IV x 1 then Vancomycin 1500mg  IV q12h Follow renal function, clinical course vanc trough as needed  Arley Phenixllen Cherese Lozano RPh 07/13/2014, 10:54 AM Pager [phone]

## 2014-07-13 NOTE — Clinical Social Work Note (Addendum)
CSW met with patient and wife who was at bedside.  Mrs. Kandis Cocking indicated that patient would not be going to rehab as he was too weak and too sick to participate in rehab. Patient indicated that he desired to go to his home. Mrs. Kandis Cocking and patient advised that going to a SNF for rehab was no longer an option.    CSW spoke with ED nurse who advised that Dr. Wyline Copas had recommended LTAC.  CSW spoke with Suanne Marker, case Freight forwarder, who advised that she was having LTAC look at patient.     Ihor Gully, [place]

## 2014-07-13 NOTE — Care Management (Signed)
IM GIVEN TO PATIENT 

## 2014-07-13 NOTE — Progress Notes (Signed)
Pt refused nocturnal BiPAP.  Pt remains on 3 Lpm nasal cannula with SpO2 of 100%.  RT will continue to monitor and assess as needed.

## 2014-07-13 NOTE — Progress Notes (Signed)
ANTICOAGULATION CONSULT NOTE - Follow Up Consult  Pharmacy Consult for Heparin Indication: pulmonary embolus  No Known Allergies  Patient Measurements: Height: 5\' 10"  (177.8 cm) Weight: (!) 359 lb (162.841 kg) IBW/kg (Calculated) : 73 Heparin Dosing Weight:   Vital Signs: Temp: 98.8 F (37.1 C) (06/27 0518) Temp Source: Axillary (06/27 0518) BP: 95/51 mmHg (06/27 0518) Pulse Rate: 123 (06/27 0518)  Labs:  Recent Labs  07/10/14 0600  07/11/14 0545  07/12/14 0445 07/12/14 1540 07/13/14 07/13/14 0500  HGB 10.3*  --  11.0*  --  10.8*  --   --  10.9*  HCT 33.1*  --  35.1*  --  34.4*  --   --  34.8*  PLT 343  --  395  --  358  --   --  355  HEPARINUNFRC 0.32  < > 0.41  < > 0.29* 0.38 0.48  --   CREATININE 0.69  --  0.67  --  0.72  --   --   --   < > = values in this interval not displayed.  Estimated Creatinine Clearance: 130.5 mL/min (by C-G formula based on Cr of 0.72).   Medications:  Infusions:  . sodium chloride Stopped (07/12/14 0600)  . sodium chloride 1,000 mL (07/12/14 2137)  . diltiazem (CARDIZEM) infusion 5 mg/hr (07/09/14 1600)  . heparin 2,250 Units/hr (07/13/14 0327)    Assessment: Patient with heparin level at goal again.  No heparin issues noted.  Goal of Therapy:  Heparin level 0.3-0.7 units/ml Monitor platelets by anticoagulation protocol: Yes   Plan:  Continue heparin drip at current rate Recheck level with 6/28 AM labs  Darlina GuysGrimsley Jr, Jacquenette ShoneJulian Crowford 07/13/2014,5:31 AM

## 2014-07-14 ENCOUNTER — Inpatient Hospital Stay (HOSPITAL_COMMUNITY): Payer: Medicare HMO

## 2014-07-14 DIAGNOSIS — J189 Pneumonia, unspecified organism: Secondary | ICD-10-CM | POA: Insufficient documentation

## 2014-07-14 DIAGNOSIS — R6521 Severe sepsis with septic shock: Secondary | ICD-10-CM

## 2014-07-14 DIAGNOSIS — R0602 Shortness of breath: Secondary | ICD-10-CM | POA: Insufficient documentation

## 2014-07-14 LAB — BASIC METABOLIC PANEL
ANION GAP: 7 (ref 5–15)
BUN: 11 mg/dL (ref 6–20)
CO2: 25 mmol/L (ref 22–32)
CREATININE: 0.86 mg/dL (ref 0.61–1.24)
Calcium: 7.1 mg/dL — ABNORMAL LOW (ref 8.9–10.3)
Chloride: 106 mmol/L (ref 101–111)
GFR calc Af Amer: >60 mL/min (ref >60)
Glucose, Bld: 116 mg/dL — ABNORMAL HIGH (ref 65–99)
Potassium: 4 mmol/L (ref 3.5–5.1)
SODIUM: 138 mmol/L (ref 135–145)

## 2014-07-14 LAB — CBC
HEMATOCRIT: 36.4 % — AB (ref 39.0–52.0)
Hemoglobin: 11.2 g/dL — ABNORMAL LOW (ref 13.0–17.0)
MCH: 30 pg (ref 26.0–34.0)
MCHC: 30.8 g/dL (ref 30.0–36.0)
MCV: 97.6 fL (ref 78.0–100.0)
Platelets: 291 10*3/uL (ref 150–400)
RBC: 3.73 MIL/uL — ABNORMAL LOW (ref 4.22–5.81)
RDW: 14.5 % (ref 11.5–15.5)
WBC: 6.7 10*3/uL (ref 4.0–10.5)

## 2014-07-14 LAB — HEPARIN LEVEL (UNFRACTIONATED)
Heparin Unfractionated: 0.84 IU/mL — ABNORMAL HIGH (ref 0.30–0.70)
Heparin Unfractionated: 0.51 IU/mL (ref 0.30–0.70)

## 2014-07-14 LAB — CORTISOL: CORTISOL PLASMA: 14.2 ug/dL

## 2014-07-14 LAB — LACTIC ACID, PLASMA
LACTIC ACID, VENOUS: 0.9 mmol/L (ref 0.5–2.0)
Lactic Acid, Venous: 1 mmol/L (ref 0.5–2.0)

## 2014-07-14 LAB — GLUCOSE, CAPILLARY
GLUCOSE-CAPILLARY: 109 mg/dL — AB (ref 65–99)
Glucose-Capillary: 114 mg/dL — ABNORMAL HIGH (ref 65–99)
Glucose-Capillary: 139 mg/dL — ABNORMAL HIGH (ref 65–99)
Glucose-Capillary: 105 mg/dL — ABNORMAL HIGH (ref 65–99)
Glucose-Capillary: 173 mg/dL — ABNORMAL HIGH (ref 65–99)

## 2014-07-14 LAB — PROCALCITONIN: PROCALCITONIN: 0.16 ng/mL

## 2014-07-14 MED ORDER — SODIUM CHLORIDE 0.9 % IV BOLUS (SEPSIS)
500.0000 mL | Freq: Once | INTRAVENOUS | Status: AC
  Administered 2014-07-14: 500 mL via INTRAVENOUS

## 2014-07-14 MED ORDER — SODIUM CHLORIDE 0.9 % IV BOLUS (SEPSIS)
1000.0000 mL | Freq: Once | INTRAVENOUS | Status: AC
  Administered 2014-07-14: 1000 mL via INTRAVENOUS

## 2014-07-14 MED ORDER — GLUCERNA SHAKE PO LIQD
237.0000 mL | Freq: Three times a day (TID) | ORAL | Status: DC
  Administered 2014-07-14 – 2014-07-15 (×4): 237 mL via ORAL
  Filled 2014-07-14 (×9): qty 237

## 2014-07-14 MED ORDER — HYDROCORTISONE NA SUCCINATE PF 100 MG IJ SOLR
50.0000 mg | Freq: Four times a day (QID) | INTRAMUSCULAR | Status: DC
  Administered 2014-07-14 – 2014-07-15 (×3): 50 mg via INTRAVENOUS
  Filled 2014-07-14 (×4): qty 2

## 2014-07-14 MED ORDER — DEXTROSE 5 % IV SOLN
0.0000 ug/min | INTRAVENOUS | Status: DC
  Administered 2014-07-14: 10 ug/min via INTRAVENOUS
  Administered 2014-07-15: 20 ug/min via INTRAVENOUS
  Filled 2014-07-14 (×2): qty 1

## 2014-07-14 NOTE — Clinical Social Work Note (Addendum)
CSW met with patient's wife, Vaughan Basta; daughter, Lattie Haw; and patient's brother.  CSW discussed that patient's insurance would not pay for LTAC and SNF was another option.  CSW provided patient's family with a SNF list for [facility].  Patient's wife and daughter requested that patient's clinicals be faxed to [facility], [facility] and [facility] and [place].    CSW faxed clinicals to previously mentioned facilities.    Ihor Gully, [place]

## 2014-07-14 NOTE — Progress Notes (Signed)
ANTICOAGULATION CONSULT NOTE - Follow Up Consult  Pharmacy Consult for Heparin Indication: pulmonary embolus  Assessment: 73 yoM on heparin infusion for PE.  Refer to pharmacist note from earlier today to further detail.  Level this morning was slightly supratherapeutic and infusion rate was decreased.  Level this evening is therapeutic at 0.51 with infusion at 2000 units/hr.  Goal of Therapy:  Heparin level 0.3-0.7 units/ml Monitor platelets by anticoagulation protocol: Yes   Plan:  Continue heparin IV infusion at 2000 units/hr. Recheck heparin level at midnight to confirm rate.  John BollAmanda Nkenge Paul, PharmD, BCPS Pager: [phone] 07/14/2014 4:42 PM

## 2014-07-14 NOTE — Progress Notes (Signed)
Pt continues to refuse BiPAP.  Will continue to ask Pt frequently due to Pt's confusion.  Stable vital signs noted at this time.  Breaths sounds are clear bilaterally up top and very diminished in the bases.  RT will continue to monitor as needed.  RN aware.

## 2014-07-14 NOTE — Progress Notes (Signed)
Pt's daughter discussing other options bedsides SNF/LTAC and mentioned hospice and palliative care as pt has "already given up". Requested a consult from MD and consult ordered.

## 2014-07-14 NOTE — Progress Notes (Signed)
ANTICOAGULATION CONSULT NOTE - Follow Up Consult  Pharmacy Consult for Heparin Indication: pulmonary embolus  No Known Allergies  Patient Measurements: Height: 5\' 10"  (177.8 cm) Weight: (!) 369 lb (167.377 kg) IBW/kg (Calculated) : 73 Heparin Dosing Weight: 113 kg  Vital Signs: Temp: 97.6 F (36.4 C) (06/28 0400) Temp Source: Oral (06/28 0400) BP: 80/37 mmHg (06/28 0530) Pulse Rate: 124 (06/28 0530)  Labs:  Recent Labs  07/12/14 0445 07/12/14 1540 07/13/14 07/13/14 0500 07/14/14 0430  HGB 10.8*  --   --  10.9* 11.2*  HCT 34.4*  --   --  34.8* 36.4*  PLT 358  --   --  355 291  HEPARINUNFRC 0.29* 0.38 0.48  --  0.84*  CREATININE 0.72  --   --  0.73 0.86    Estimated Creatinine Clearance: 123.5 mL/min (by C-G formula based on Cr of 0.86).   Medications:  Scheduled:  . antiseptic oral rinse  7 mL Mouth Rinse q12n4p  . atenolol  100 mg Oral Daily  . atorvastatin  10 mg Oral QHS  . chlorhexidine  15 mL Mouth Rinse BID  . gabapentin  100 mg Oral BID  . insulin aspart  0-20 Units Subcutaneous TID WC  . insulin aspart  0-5 Units Subcutaneous QHS  . nystatin   Topical TID  . piperacillin-tazobactam (ZOSYN)  IV  3.375 g Intravenous 3 times per day  . sodium chloride  10-40 mL Intracatheter Q12H  . vancomycin  1,500 mg Intravenous Q12H   Infusions:  . sodium chloride Stopped (07/12/14 0600)  . sodium chloride 75 mL/hr at 07/13/14 1900  . diltiazem (CARDIZEM) infusion 5 mg/hr (07/09/14 1600)  . heparin 2,250 Units/hr (07/14/14 0200)    Assessment: 73 yoM admitted 6/9 with diarrhea, SOB, productive cough, and fall at home.  EMS was required to assist patient after falling.  PMH includes morbid obesity, DM, HLP, HTN, OSA, and Afib on Xarelto.  Xarelto was resumed on admission.  He was incidentally found to have new PE with right heart strain on 6/18.  Pharmacy was consulted to transition from Xarelto to Heparin.  Today, 07/14/2014:  Heparin level: 0.84,  supratherapeutic  CBC: Hgb 11.2 (low/stable), Plt 291  No bleeding reported or documented; no infusion interruptions or complications per RN  Renal: SCr 0.86, CrCl > 100 ml/min   Goal of Therapy:  Heparin level 0.3-0.7 units/ml Monitor platelets by anticoagulation protocol: Yes   Plan:   decrease to heparin IV infusion at 2000 units/hr  Heparin level 8 hours after rate change  Daily heparin level and CBC  Continue to monitor H&H and platelets  F/u plan for outpatient anticoagulation for PE and afib.    Arley Phenixllen Millissa Deese RPh 07/14/2014, 7:52 AM Pager [phone]

## 2014-07-14 NOTE — Progress Notes (Signed)
eLink Physician-Brief Progress Note Patient Name: Curlene LabrumFrank Batra DOB: [birth year] MRN: [id number]   Date of Service  07/14/2014  HPI/Events of Note  cxr some worse Repeat caermer aa care - no change. Still not using bipap despite repeat order  eICU Interventions  Nil acute DNR , DNI , refusing bipap     Intervention Category Evaluation Type: Other  Tredarius Cobern 07/14/2014, 8:25 PM

## 2014-07-14 NOTE — Progress Notes (Signed)
eLink Physician-Brief Progress Note Patient Name: John Paul DOB: May 06, 1940 MRN: [id number]   Date of Service  07/14/2014  HPI/Events of Note  Got 3L fluids and RN says now has crackles on 3L Friona awith desats to 89% . On neo, Is DNR, DNI - has refused biPAP  eICU Interventions  resetrart bipap cxr stat     Intervention Category Major Interventions: Respiratory failure - evaluation and management  [name] 07/14/2014, 6:16 PM

## 2014-07-14 NOTE — Progress Notes (Signed)
PULMONARY / CRITICAL CARE MEDICINE   Name: John LabrumFrank Steuber MRN: [id number] DOB: 07-10-1940    ADMISSION DATE:  06/26/2014   CHIEF COMPLAINT:  Diarrhea   INITIAL PRESENTATION: 74 y.o. M [facility] with diarrhea x 6 days. Found to have hypotension with minimal improvement after 4L crystalloid. [facility] by [facility]M [facility] Abx were d/c'd as all cultures remained negative. Hospital course complicated by a.fib with RVR for which he was placed on cardizem.  On 6/15, pt had mild fever to 100.7 with continued intermittent hypotension therefore primary team concerned for sepsis. Workup initiated and pt placed back on Vanc / Zosyn. Pt was also slightly tachypneic and apparently had mild respiratory distress; therefore, PCCM was re-consulted.  He was diuresed and improved.  PCCM signed off.  Patient found to have PE (despite chronic anticoagulation) and is on heparin gtt.  He was culture positive for pseudomonal UTI (Sens to zosyn).  Developed concern for LLL infiltrate and hypotension on 6/28.  PCCM called back to re-evaluate patient.    STUDIES:    SIGNIFICANT EVENTS: 6/09 - admitted after 6 days diarrhea with decreased PO.  6/10 - [facility] assumed care. 6/11 - A.fib with RVR > started on cardizem gtt. 6/13 - back in RVR after cardizem stopped; therefore cardizem resumed for 24 hrs 6/15 - PCCM called for respiratory distress. Placed back on vanc / zosyn and re-cultured for concerns of sepsis. 6/16 - [facility] - PCCM called back for concern for hypotension / possible sepsis      SUBJECTIVE: RN reports the patient is clinically unchanged, difficult to monitor BP due to body habitus.  BP's running in 70's systolic compared to previous with SBP in 90's.  Currently receiving 3rd liter of NS.  Atenolol being held for hypotension.    VITAL SIGNS: Temp:  [97.6 F (36.4 C)-98.4 F (36.9 C)] 98.2 F (36.8 C) (06/28 1148) Pulse Rate:   [102-124] 122 (06/28 1400) Resp:  [18-43] 23 (06/28 1400) BP: (64-115)/(32-71) 72/39 mmHg (06/28 1400) SpO2:  [91 %-100 %] 91 % (06/28 1400) Weight:  [369 lb (167.377 kg)] 369 lb (167.377 kg) (06/28 0530)  INTAKE / OUTPUT:  Intake/Output Summary (Last 24 hours) at 07/14/14 1440 Last data filed at 07/14/14 1400  Gross per 24 hour  Intake   5615 ml  Output    850 ml  Net   4765 ml    PHYSICAL EXAMINATION: General:  Morbidly obese male in NAD  Neuro:  AAOx4, speech clear, MAE, pleasant  HEENT:  MM pink/dry, short / thick neck  Cardiovascular:  s1s2 irr irr, distant tones  Lungs:  resp's even/non-labored, lungs bilaterally diminished  Abdomen:  Obese/soft, bsx4 active  Musculoskeletal:  No acute deformities  Skin:  Warm/dry, no edema   LABS:  CBC  Recent Labs Lab 07/12/14 0445 07/13/14 0500 07/14/14 0430  WBC 6.0 7.1 6.7  HGB 10.8* 10.9* 11.2*  HCT 34.4* 34.8* 36.4*  PLT 358 355 291   Coag's  Recent Labs Lab 07/08/14 0521 07/08/14 0855 07/09/14 0400  APTT 43* 70* 58*   BMET  Recent Labs Lab 07/12/14 0445 07/13/14 0500 07/14/14 0430  NA 141 139 138  K 3.7 3.8 4.0  CL 105 104 106  CO2 29 27 25   BUN 11 10 11   CREATININE 0.72 0.73 0.86  GLUCOSE 135* 107* 116*   Electrolytes  Recent Labs Lab 07/09/14 0400  07/11/14 0545 07/12/14 0445 07/13/14  0500 07/14/14 0430  CALCIUM 7.7*  < > 7.6* 7.7* 7.4* 7.1*  MG 1.4*  --  1.7  --   --   --   < > = values in this interval not displayed.   Sepsis Markers  Recent Labs Lab 07/14/14 0748 07/14/14 1026  LATICACIDVEN 1.0 0.9   ABG  Recent Labs Lab 07/13/14 0930  PHART 7.416  PCO2ART 37.2  PO2ART 60.1*   Liver Enzymes  Recent Labs Lab 07/13/14 0500  AST 17  ALT 13*  ALKPHOS 74  BILITOT 0.4  ALBUMIN 1.6*   Cardiac Enzymes No results for input(s): TROPONINI, PROBNP in the last 168 hours.   Glucose  Recent Labs Lab 07/13/14 1153 07/13/14 1543 07/13/14 1656 07/13/14 2157  07/14/14 0747 07/14/14 1126  GLUCAP 113* 136* 141* 121* 114* 139*    Imaging No results found.   ASSESSMENT / PLAN:   CARDIOVASCULAR A:  Hypotension - rule out sepsis vs AI Atrial Fibrillation - permanent, on anticoagulation  Volume Overload - admit wt of 320 lbs, currently 369 lbs HLD P:  Assess STAT cortisol  Begin empiric stress steroids after cortisol drawn  Rate control of AF Would like to diurese if BP would permit  D/C atenolol  DNR  Ok for temporary vasopressors if needed - consider neosynephrine with AF Monitor BP trend, assessing on L wrist for monitoring, ? accuracy.   RENAL A:   No acute issues  P:   Trend BMP / UOP   INFECTIOUS A:   Rule Out Sepsis - not overly impressed for sepsis with no fever, negative lactate 6/28, no end organ effects and concern for inaccurate BP  P:   BCx2 6/27 >>  UC 6/20 >> pseudomonas aeruginosa >> sens zosyn   Zosyn, start date 6/22, day 7/x Vanco, start date 6/28, day 1/x   Assess PCT to guide abx duration.  Suspect he has been adequately covered.   Follow cultures   PULMONARY A: Acute on Chronic Hypoxic Respiratory Failure Possible LLL Infiltrate  Dyspnea  OSA / OHS  Suspected PAH / RV Dysfunction  Pulmonary Edema  Pulmonary Embolism - on Xarelto when developed P:   Heparin per pharmacy  QHS BiPAP  Oxygen as needed to support sats > 92% Would like to diurese but BP will not allow  Mobilize as able  Monitor intermittent CXR to follow infiltrate   PSY  A:  Depression  Chronic Pain  P:  Consider SSRI, defer to primary   FAMILY  - Updates: Patient and family updated at bedside.  Initially, they said no to vasopressors but are ok with short term use if needed.    - Inter-disciplinary family meet or Palliative Care meeting due by:  Completed by Dr. Rhona Leavens on 6/28.  DNR/DNI.  Confirmed DNR / DNI status with family.      CC Time:  35 minutes   Canary Brim, NP-C [facility] Pgr:  [phone] or if no answer [phone] 07/14/2014, 2:40 PM

## 2014-07-14 NOTE — Progress Notes (Signed)
PT Cancellation Note  Patient Details Name: John Paul MRN: [id number] DOB: 07-16-1940   Cancelled Treatment:    Reason Eval/Treat Not Completed: Medical issues which prohibited therapy per RN-will hold therapy. Will continue to check back. Thanks.    John Paul, MPT Pager: [phone]

## 2014-07-14 NOTE — Progress Notes (Addendum)
[facility] PROGRESS NOTE  John Paul BDZ:[id number] DOB: [birth year] DOA: 06/18/2014 PCP: Chesley Noon, MD   Off Service Summary: John Paul is an 74 y.o. male with a PMH of hypertension, atrial fibrillation on chronic Xarelto, diabetes and dyslipidemia who was admitted 07/15/2014 by the critical care team with hypotension, lactic acidosis, and severe diarrhea. Pt was initially admitted to ICU service where his BP was ultimately stabilized with aggressive IVF hydration. Cdiff was neg as was fecal pathogen panel. The patient's diarrhea gradually improved. During this course, the patient encountered periods of afib with RVR requiring cardizem gtt. on 07/01/14, the patient was noted to have a wheeze with increased respiratory distress and recurrent fever. Empiric vancomycin/Zosyn started and pulmonology called in consultation. Due to ongoing fevers and hypotension with no obvious source of sepsis, ID consultation was requested. A CT of the chest, abdomen and pelvis were obtained to help identify a source of the patient's ongoing symptoms. CT of the chest unexpectedly showed a large mainstem pulmonary artery embolism (had been on Xarelto with no missed doses). CT of the abdomen showed nonspecific colitis, likely ischemic given the patient's hypotension. At this time, the patient remains on a heparin drip and critically ill in the ICU.  Events from 6/22-6/28: The patient continued with intermittent bouts of RVR requiring cardizem gtt. The patient had been continued on empiric rocephin for presumed UTI with urine culture later returning pseudomonas and abx was changed to zosyn. Shortly thereafter, the patient was found to have continued tachycardia and decreasing BP and was found to have a new LLL pna and vancomycin was added. Given his prolonged hospital course with slow recovery, LTAC was considered, however, would not be covered by pt's insurance. The patient's condition continued to worsen and he  was found to be in presumed septic shock not responding to IVF boluses. During this time, patient and family had expressed wish for DNR/DNI, but OK for short term pressors. Critical Care was consulted who has since evaluated the patient. Of note, the patient remains of a heparin gtt for acute PE after failing prophylactic xarelto. Case was discussed with pharmacy with recommendation to transition to coumadin if/when patient clinically improves.  Assessment/Plan:  Hypovolemic shock/hypotension in the setting of severe diarrhea with lactic acidosis/severe dehydration rule out sepsis - Status post aggressive fluid volume resuscitation. - Empiric Flagyl was started on admission and later discontinued 06/29/14 after C. difficile studies were found to be negative. - Stool pathogen panel was found to be negative. - Given ongoing lack of improvement and concerns for occult infection, a CT of the abdomen/pelvis was obtained 07/04/14. - Suspect diarrhea related to ischemic insult to the bowel/colitis in the setting of large pulmonary embolism.   Acute respiratory distress secondary to pulmonary vascular congestion and large right mainstem pulmonary embolism/ cardiomegaly/ right heart strain / acute systolic right heart failure - On 07/01/14, the pt developed acute respiratory distress. CXR showed cardiomegaly and pulmonary congestion. Lasix given. - CT of the chest obtained 07/04/14 with an incidentally discovered large right mainstem PE. - Xarelto was discontinued and the patient was started on a heparin drip. - Per PCCM and IR, no current role for clot dissolution. - 2-D echo done 07/05/14. EF 55-60 percent, no regional wall motion abnormality but systolic function reduced in the right ventricle.   Oral thrush - Improved with nystatin.   Atrial fibrillation with RVR / permanent atrial fibrillation on chronic anticoagulation with Xarelto - On 06/26/14, HR increased into the 130s and EKG  showed atrial  flutter. Patient was started on Cardizem IV. Atenolol resumed. - Was on chronic Xarelto 20 mg daily and atenolol. Atenolol was held on admission. Pt now on heparin given PE. - Cardiology consultation was requested after patient developed hypotension necessitating holding atenolol. - Per cardiologist, no plans for inpatient cardioversion. Will eventually need follow-up post discharge. - Suspect atrial fibrillation/flutter acutely worsened rate control secondary to large PE vs infection.  - Pt had required cardizem gtt intermittently   UTI - U/A obtained 07/06/14 after heart rate elevated. Urine was cloudy with positive nitrites and moderate leukocytes, many bacteria. - Empiric Rocephin was started - Multi-drug sensitive pseudomonas was noted on urine cx - Have since continued Zosyn and changed out foley cath   Delirium  - Received haldol PRN secondary to delirium. - Head CT w/w/o contrast was unremarkable for acute process - Suspect toxic-metabolic encephalopathy secondary to infection   Hyperlipidemia - Continue statin therapy.   Severe physical deconditioning - Was seen by physical therapy. - SNF was recommended.   Diabetes mellitus type 2, noninsulin dependent - Hemoglobin A1c 6.3%.  - Oral hypoglycemics remain on hold. - Currently on insulin resistant SSI.   Hyperkalemia / hyponatremia / hypomagnesemia / non-anion gap metabolic acidosis - Hyperkalemia, hyponatremia resolved with IV fluids. - Magnesium was replaced.   Hypocalcemia - Given 1 g of calcium gluconate 06/30/2014.   Leukocytosis / rule out sepsis - Blood cultures done 07/04/2014: Negative. - Urine cultures done 07/13/2014: Negative. - Repeat blood cultures done 07/01/14: Negative to date. - Repeat urine cultures done 07/06/14: >100,000 pseudomonas, multidrug sensitive   Obstructive sleep apnea / morbid obesity / hypoalbuminemia - BMI 49.07. Continue on heart healthy/carbohydrate modified diet. - Dietitian  evaluation done 06/26/14. - Not on C Pap. Nocturnal BiPAP was started 07/01/14, however patient refused   Pressure ulcer - Pt was evaluated by wound care nurse, continue silicone dressing over sacral area and elbow. - Continue bariatric mattress and bilateral pressure redistribution boots.   Normocytic anemia - Likely anemia of chronic disease. No current indication for transfusion.   Rule out polyarticular arthritis/inflammatory bowel disease  - ESR 130, CK low at 20. LDH normal at 130. - RF elevated at 14.8. The low positive predictive value of the RF casts doubt on the utility of the RF in the diagnostic evaluation of patients. Arch Intern Med. 1992 Dec;152(12):2417-20. - ANA negative. Acute hepatitis panel negative. Anti-double-stranded DNA antibody negative. - Sjogren's syndrome antibodies negative. ANCA negative. - CCP pending - cryoglobulins NEG - SPEP elevated at 130, but can be seen in acute infection.   DVT Prophylaxis - Heparin gtt for PE  HCAP with sepsis and septic shock - Over the past several days, pt noted with increased respirations, tachycardia, mental status change - Obtained CXR on 6/27 with findings of new LLL opacity concerning for PNA with small L pleural effusion - Already on zosyn for pseudomonas UTI. Have added vancomycin to cover HCAP - Repeat blood cultures pending - Pt remains clinically septic with concerns of septic shock, no significant improvement in BP despite multiple NS boluses - Have consulted Critical Care. If needed, neosynephrine for BP support  End of Life - Time spent with pt and family at bedside - Patient's wishes are noted to be DNR/DNI, with exception of short term pressors if needed, confirmed with wife at bedside - Have consulted Palliative Care per family wishes.   Code Status: DNR/DNI, pressors OK Family Communication: Pt in room, wife at bedside  Disposition Plan:  Pending   Consultants:  ID  PCCM  IR  Procedures:    Antibiotics:  Vancomycin 06/19/2014---> 06/26/14, restarted 07/01/14---> 07/03/14, restarted 6/27>>>  Zosyn 06/20/2014---> 06/26/14, restarted 07/01/14---> 07/03/14, restarted 6/22>>>  Flagyl 06/24/2014---> 06/29/14  Rocephin 07/07/14--->6/22  HPI/Subjective: Wants to go home. Denies chest pain or sob   Objective: Filed Vitals:   07/14/14 1600 07/14/14 1601 07/14/14 1630 07/14/14 1700  BP: 92/71  72/40 74/40  Pulse: 116   122  Temp:  98.2 F (36.8 C)    TempSrc:  Axillary    Resp: 17   31  Height:      Weight:      SpO2: 93%   94%    Intake/Output Summary (Last 24 hours) at 07/14/14 1708 Last data filed at 07/14/14 1703  Gross per 24 hour  Intake 5576.5 ml  Output    800 ml  Net 4776.5 ml   Filed Weights   07/12/14 0700 07/13/14 0500 07/14/14 0530  Weight: 166.017 kg (366 lb) 162.841 kg (359 lb) 167.377 kg (369 lb)    Exam:   General:  Awake, laying in bed, conversant, oriented, in nad  Cardiovascular: regular, tachycardic, s1, s2  Respiratory: normal resp effort, no wheezing  Abdomen: soft, nondistended, perfused, nontender  Musculoskeletal: perfused, no clubbing  Data Reviewed: Basic Metabolic Panel:  Recent Labs Lab 07/09/14 0400 07/10/14 0600 07/11/14 0545 07/12/14 0445 07/13/14 0500 07/14/14 0430  NA 138 141 140 141 139 138  K 4.2 3.8 3.7 3.7 3.8 4.0  CL 104 104 104 105 104 106  CO2 _0 GLUCOSE 119* 127* 123* 135* 107* 116*  BUN _1 CREATININE 0.75 0.69 0.67 0.72 0.73 0.86  CALCIUM 7.7* 7.8* 7.6* 7.7* 7.4* 7.1*  MG 1.4*  --  1.7  --   --   --    Liver Function Tests:  Recent Labs Lab 07/13/14 0500  AST 17  ALT 13*  ALKPHOS 74  BILITOT 0.4  PROT 4.7*  ALBUMIN 1.6*   No results for input(s): LIPASE, AMYLASE in the last 168 hours. No results for input(s): AMMONIA in the last 168 hours. CBC:  Recent Labs Lab 07/10/14 0600 07/11/14 0545  07/12/14 0445 07/13/14 0500 07/14/14 0430  WBC 7.7 7.7 6.0 7.1 6.7  HGB 10.3* 11.0* 10.8* 10.9* 11.2*  HCT 33.1* 35.1* 34.4* 34.8* 36.4*  MCV 97.4 98.6 98.6 97.5 97.6  PLT 343 395 358 355 291   Cardiac Enzymes: No results for input(s): CKTOTAL, CKMB, CKMBINDEX, TROPONINI in the last 168 hours. BNP (last 3 results)  Recent Labs  06/19/2014 1546  BNP 92.8    ProBNP (last 3 results) No results for input(s): PROBNP in the last 8760 hours.  CBG:  Recent Labs Lab 07/13/14 1656 07/13/14 2157 07/14/14 0747 07/14/14 1126 07/14/14 1540  GLUCAP 141* 121* 114* 139* 109*    Recent Results (from the past 240 hour(s))  Culture, Urine     Status: None   Collection Time: 07/06/14 11:20 PM  Result Value Ref Range Status   Specimen Description URINE, RANDOM  Final   Special Requests NONE  Final   Culture   Final    >=100,000 COLONIES/mL PSEUDOMONAS AERUGINOSA Performed at [facility]    Report Status 07/09/2014 FINAL  Final   Organism ID, Bacteria PSEUDOMONAS AERUGINOSA  Final      Susceptibility   Pseudomonas aeruginosa - MIC*    CEFTAZIDIME 4 SENSITIVE  Sensitive     CIPROFLOXACIN <=0.25 SENSITIVE Sensitive     GENTAMICIN 4 INTERMEDIATE Intermediate     IMIPENEM 2 SENSITIVE Sensitive     PIP/TAZO 8 SENSITIVE Sensitive     CEFEPIME 4 SENSITIVE Sensitive     * >=100,000 COLONIES/mL PSEUDOMONAS AERUGINOSA  Culture, blood (routine x 2)     Status: None (Preliminary result)   Collection Time: 07/13/14  9:35 AM  Result Value Ref Range Status   Specimen Description BLOOD LEFT HAND  Final   Special Requests BOTTLES DRAWN AEROBIC ONLY [place]  Final   Culture   Final    NO GROWTH 1 DAY Performed at [facility]    Report Status PENDING  Incomplete  Culture, blood (routine x 2)     Status: None (Preliminary result)   Collection Time: 07/13/14  9:40 AM  Result Value Ref Range Status   Specimen Description BLOOD RIGHT ARM  Final   Special Requests BOTTLES DRAWN  AEROBIC AND ANAEROBIC 10CC  Final   Culture   Final    NO GROWTH 1 DAY Performed at [facility]    Report Status PENDING  Incomplete     Studies: Dg Chest Port 1 View  07/13/2014   CLINICAL DATA:  Healthcare associated pneumonia.  Weakness.  EXAM: PORTABLE CHEST - 1 VIEW  COMPARISON:  07/01/2014.  FINDINGS: Increasing airspace opacity noted in the left lower lobe. Small left pleural effusion. There is stable cardiomegaly. No confluent opacity on the right. Low lung volumes. No acute bony abnormality.  IMPRESSION: New left lower lobe airspace opacity, concerning for pneumonia. Small left pleural effusion.  Stable cardiomegaly.  Low lung volumes.   Electronically Signed   By: Rolm Baptise M.D.   On: 07/13/2014 10:17    Scheduled Meds: . antiseptic oral rinse  7 mL Mouth Rinse q12n4p  . atorvastatin  10 mg Oral QHS  . chlorhexidine  15 mL Mouth Rinse BID  . feeding supplement (GLUCERNA SHAKE)  237 mL Oral TID BM  . gabapentin  100 mg Oral BID  . hydrocortisone sod succinate (SOLU-CORTEF) inj  50 mg Intravenous Q6H  . insulin aspart  0-20 Units Subcutaneous TID WC  . insulin aspart  0-5 Units Subcutaneous QHS  . nystatin   Topical TID  . piperacillin-tazobactam (ZOSYN)  IV  3.375 g Intravenous 3 times per day  . sodium chloride  10-40 mL Intracatheter Q12H  . vancomycin  1,500 mg Intravenous Q12H   Continuous Infusions: . sodium chloride Stopped (07/12/14 0600)  . sodium chloride 75 mL/hr at 07/13/14 1900  . diltiazem (CARDIZEM) infusion 5 mg/hr (07/09/14 1600)  . heparin 2,000 Units/hr (07/14/14 1700)  . phenylephrine (NEO-SYNEPHRINE) Adult infusion 20 mcg/min (07/14/14 1703)    Principal Problem:   Hypovolemic shock in the setting of severe diarrhea Active Problems:   Atrial fibrillation with RVR   Morbid obesity   Hyperlipidemia   Diabetes mellitus type 2, noninsulin dependent   Hypotension   Acute kidney injury   Diarrhea   Dehydration, severe   Hyperkalemia    Hyponatremia   Lactic acidosis   Chronic anticoagulation-Xarelto   Permanent atrial fibrillation   Leukocytosis   Pulmonary vascular congestion   Sepsis   Pressure ulcer   Hypoalbuminemia   Normocytic anemia   Acute renal failure syndrome   Coagulopathy   FUO (fever of unknown origin)   IBD (inflammatory bowel disease)   Other noninfectious gastroenteritis   Myalgia and myositis   Polyarticular arthritis  Acute pulmonary embolism with right heart strain   Acute right-sided heart failure   Acute delirium   Sepsis due to pneumonia   HCAP (healthcare-associated pneumonia)   Shortness of breath    John Paul, [facility] Pager [phone]. If 7PM-7AM, please contact night-coverage at [email or web address], password [email or web address] 07/14/2014, 5:08 PM  LOS: 19 days

## 2014-07-15 ENCOUNTER — Inpatient Hospital Stay (HOSPITAL_COMMUNITY): Payer: Medicare HMO

## 2014-07-15 DIAGNOSIS — G4733 Obstructive sleep apnea (adult) (pediatric): Secondary | ICD-10-CM

## 2014-07-15 DIAGNOSIS — I509 Heart failure, unspecified: Secondary | ICD-10-CM

## 2014-07-15 DIAGNOSIS — I2699 Other pulmonary embolism without acute cor pulmonale: Secondary | ICD-10-CM

## 2014-07-15 DIAGNOSIS — I959 Hypotension, unspecified: Secondary | ICD-10-CM

## 2014-07-15 DIAGNOSIS — Z515 Encounter for palliative care: Secondary | ICD-10-CM

## 2014-07-15 DIAGNOSIS — R410 Disorientation, unspecified: Secondary | ICD-10-CM

## 2014-07-15 DIAGNOSIS — R571 Hypovolemic shock: Secondary | ICD-10-CM

## 2014-07-15 LAB — HEPARIN LEVEL (UNFRACTIONATED)
HEPARIN UNFRACTIONATED: 0.66 [IU]/mL (ref 0.30–0.70)
HEPARIN UNFRACTIONATED: 0.76 [IU]/mL — AB (ref 0.30–0.70)

## 2014-07-15 LAB — CBC
HCT: 36.6 % — ABNORMAL LOW (ref 39.0–52.0)
Hemoglobin: 11.5 g/dL — ABNORMAL LOW (ref 13.0–17.0)
MCH: 31.3 pg (ref 26.0–34.0)
MCHC: 31.4 g/dL (ref 30.0–36.0)
MCV: 99.7 fL (ref 78.0–100.0)
PLATELETS: 324 10*3/uL (ref 150–400)
RBC: 3.67 MIL/uL — ABNORMAL LOW (ref 4.22–5.81)
RDW: 15.1 % (ref 11.5–15.5)
WBC: 5.7 10*3/uL (ref 4.0–10.5)

## 2014-07-15 LAB — BASIC METABOLIC PANEL
ANION GAP: 5 (ref 5–15)
BUN: 9 mg/dL (ref 6–20)
CALCIUM: 7.4 mg/dL — AB (ref 8.9–10.3)
CO2: 26 mmol/L (ref 22–32)
Chloride: 111 mmol/L (ref 101–111)
Creatinine, Ser: 0.85 mg/dL (ref 0.61–1.24)
GFR calc non Af Amer: >60 mL/min (ref >60)
Glucose, Bld: 181 mg/dL — ABNORMAL HIGH (ref 65–99)
Potassium: 3.8 mmol/L (ref 3.5–5.1)
SODIUM: 142 mmol/L (ref 135–145)

## 2014-07-15 LAB — GLUCOSE, CAPILLARY: GLUCOSE-CAPILLARY: 130 mg/dL — AB (ref 65–99)

## 2014-07-15 LAB — PROCALCITONIN: PROCALCITONIN: 0.19 ng/mL

## 2014-07-15 MED ORDER — MORPHINE SULFATE 2 MG/ML IJ SOLN: 2.0000 mg | INTRAMUSCULAR | Status: DC | PRN

## 2014-07-15 MED ORDER — GLYCOPYRROLATE 0.2 MG/ML IJ SOLN
0.4000 mg | INTRAMUSCULAR | Status: DC | PRN
  Filled 2014-07-15: qty 2

## 2014-07-15 MED ORDER — MORPHINE SULFATE 2 MG/ML IJ SOLN
2.0000 mg | INTRAMUSCULAR | Status: DC | PRN
Start: 2014-07-15 — End: 2014-07-15

## 2014-07-15 MED ORDER — LORAZEPAM 2 MG/ML IJ SOLN: 1.0000 mg | INTRAMUSCULAR | Status: DC | PRN

## 2014-07-15 MED ORDER — MORPHINE SULFATE (CONCENTRATE) 10 MG/0.5ML PO SOLN: 5.0000 mg | ORAL | Status: DC | PRN

## 2014-07-15 NOTE — Consult Note (Signed)
Consultation Note Date: 07/15/2014   Patient Name: John Paul  DOB: [birth year]  MRN: [id number]  Age / Sex: 74 y.o., male   PCP: Chesley Noon, MD Referring Physician: Debbe Odea, MD  Reason for Consultation: Establishing goals of care  Palliative Care Assessment and Plan Summary of Established Goals of Care and Medical Treatment Preferences    Palliative Care Discussion Held Today:   I met today with Derrek, wife, daughter, brother at bedside. They are clear that they are in a place where Amay is not improving and is quite miserable and in pain everytime he is moved/turned. At this point they are prepared and asking for comfort care. He has been very clear with them about his wishes for this in the past and they are accepting that he is at end of life at this point. They tell me that he is ready to go "home" (they say that he is referring to heaven). 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 They are strong in their faith and just want him to comfortable and not to suffer.    Contacts/Participants in Discussion: Primary Decision Maker: Wife   Goals of Care/Code Status/Advance Care Planning:   Code Status: DNR  Full comfort   Symptom Management:   Pain/dypnea: Morphine 2 mg every 30 min prn.   Anxiety: Lorazepam 1 mg every 4 hours prn.  Secretions: Robinul prn.   Psycho-social/Spiritual:   Support System: Good support. Wife, daughter, and grandson at bedside along with his brother who came up from [place] to be with them.  Desire for further Chaplaincy support: yes  Prognosis: Days to weeks  Discharge Planning:  Hospice facility       Chief Complaint: diarrhea  History of Present Illness: 74 yo male with PMH of hypertension, atrial fibrillation on chronic Xarelto, obstructive sleep apnea, diabetes and dyslipidemia who was admitted 07/08/2014 by the critical care  team with hypotension, lactic acidosis, and severe diarrhea. C diff negative. Diarrhea thought to be ischemic insult to bowel in setting of large pulmonary embolism, sepsis, hypotension. Hospital course complicated by pseudomonas UTI, LLL pneumonia, A fib RVR, delirium, worsening weakness. Prolonged hospitalization with little to no improvement.   Primary Diagnoses  Present on Admission:  . Hypotension . Acute kidney injury . (Resolved) Pressure ulcer . Atrial fibrillation with RVR . Morbid obesity . (Resolved) Hypertension . Diarrhea . Dehydration, severe . Hyperkalemia . Hyponatremia . (Resolved) Metabolic acidosis with normal anion gap and bicarbonate losses . Lactic acidosis . Permanent atrial fibrillation . Leukocytosis . Pulmonary vascular congestion . Hyperlipidemia . Sepsis . Pressure ulcer . Hypovolemic shock in the setting of severe diarrhea . Hypoalbuminemia . Normocytic anemia . Acute renal failure syndrome . Coagulopathy . FUO (fever of unknown origin) . Acute pulmonary embolism with right heart strain  Palliative Review of Systems:   Unable to assess - very lethargic and when awake difficult to understand mumbled speech   I have reviewed the medical record, interviewed the patient and family, and examined the patient. The following aspects are pertinent.  Past Medical History  Diagnosis Date  . Diabetes mellitus   . Hypertension   . Hyperlipidemia   . Morbid obesity     WITH HYPOVENTILATION  . OSA (obstructive sleep apnea)   . Atrial fibrillation   . Acute pulmonary embolism with right heart strain 07/05/2014   History   Social History  . Marital  Status: Married    Spouse Name: N/A  . Number of Children: 2  . Years of Education: N/A   Occupational History  . retired    Social History Main Topics  . Smoking status: Former Smoker    Quit date: 07/27/1970  . Smokeless tobacco: Not on file  . Alcohol Use: No  . Drug Use: No  . Sexual Activity:  Not on file   Other Topics Concern  . None   Social History Narrative   Family History  Problem Relation Age of Onset  . Clotting disorder Father    Scheduled Meds: . antiseptic oral rinse  7 mL Mouth Rinse q12n4p  . atorvastatin  10 mg Oral QHS  . chlorhexidine  15 mL Mouth Rinse BID  . feeding supplement (GLUCERNA SHAKE)  237 mL Oral TID BM  . gabapentin  100 mg Oral BID  . hydrocortisone sod succinate (SOLU-CORTEF) inj  50 mg Intravenous Q6H  . insulin aspart  0-20 Units Subcutaneous TID WC  . insulin aspart  0-5 Units Subcutaneous QHS  . nystatin   Topical TID  . sodium chloride  10-40 mL Intracatheter Q12H   Continuous Infusions: . sodium chloride Stopped (07/12/14 0600)  . sodium chloride Stopped (07/14/14 1821)  . heparin 1,900 Units/hr (07/15/14 0804)   PRN Meds:.acetaminophen, diphenoxylate-atropine, HYDROcodone-acetaminophen, labetalol, levalbuterol Medications Prior to Admission:  Prior to Admission medications   Medication Sig Start Date End Date Taking? Authorizing Provider  Alcohol Swabs (B-D SINGLE USE SWABS REGULAR) PADS  06/19/14  Yes Historical Provider, MD  atenolol (TENORMIN) 100 MG tablet  01/02/14  Yes Historical Provider, MD  docusate sodium (COLACE) 100 MG capsule Take 100 mg by mouth 2 (two) times daily.   Yes Historical Provider, MD  metFORMIN (GLUCOPHAGE-XR) 500 MG 24 hr tablet Take 500 mg by mouth 2 (two) times daily. 01/15/14  Yes Historical Provider, MD  Multiple Vitamin (MULTIVITAMIN) tablet Take 1 tablet by mouth daily.     Yes Historical Provider, MD  OVER THE COUNTER MEDICATION 1 tablet daily as needed (Sudafed- strength unknown).   Yes Historical Provider, MD  quinapril (ACCUPRIL) 20 MG tablet  01/02/14  Yes Historical Provider, MD  rivaroxaban (XARELTO) 20 MG TABS tablet Take 1 tablet (20 mg total) by mouth daily. 01/13/14  Yes Peter M Martinique, MD  simvastatin (ZOCOR) 20 MG tablet Take 20 mg by mouth at bedtime.     Yes Historical Provider, MD    traMADol Veatrice Bourbon) 50 MG tablet  07/30/10  Yes Historical Provider, MD  triamterene-hydrochlorothiazide (DYAZIDE) 37.5-25 MG per capsule Take 1 capsule by mouth every morning.     Yes Historical Provider, MD  TRUE METRIX BLOOD GLUCOSE TEST test strip  06/19/14  Yes Historical Provider, MD  glyBURIDE-metformin (GLUCOVANCE) 2.5-500 MG per tablet Take 1 tablet by mouth 2 (two) times daily with a meal.      Historical Provider, MD   No Known Allergies CBC:    Component Value Date/Time   WBC 5.7 07/15/2014 0349   HGB 11.5* 07/15/2014 0349   HCT 36.6* 07/15/2014 0349   PLT 324 07/15/2014 0349   MCV 99.7 07/15/2014 0349   NEUTROABS 5.2 07/05/2014 0500   LYMPHSABS 1.0 07/05/2014 0500   MONOABS 1.3* 07/05/2014 0500   EOSABS 0.1 07/05/2014 0500   BASOSABS 0.0 07/05/2014 0500   Comprehensive Metabolic Panel:    Component Value Date/Time   NA 142 07/15/2014 0349   K 3.8 07/15/2014 0349   CL 111 07/15/2014 0349  CO2 26 07/15/2014 0349   BUN 9 07/15/2014 0349   CREATININE 0.85 07/15/2014 0349   GLUCOSE 181* 07/15/2014 0349   CALCIUM 7.4* 07/15/2014 0349   AST 17 07/13/2014 0500   ALT 13* 07/13/2014 0500   ALKPHOS 74 07/13/2014 0500   BILITOT 0.4 07/13/2014 0500   PROT 4.7* 07/13/2014 0500   ALBUMIN 1.6* 07/13/2014 0500    Physical Exam:  Vital Signs: BP 115/67 mmHg  Pulse 119  Temp(Src) 98.1 F (36.7 C) (Axillary)  Resp 19  Ht _0  (1.778 m)  Wt 174.635 kg (385 lb)  BMI 55.24 kg/m2  SpO2 97% SpO2: SpO2: 97 % O2 Device: O2 Device: Nasal Cannula O2 Flow Rate: O2 Flow Rate (L/min): 3 L/min Intake/output summary:  Intake/Output Summary (Last 24 hours) at 07/15/14 1002 Last data filed at 07/15/14 0600  Gross per 24 hour  Intake 3222.5 ml  Output   1085 ml  Net 2137.5 ml   LBM: Last BM Date: 07/14/14 Baseline Weight: Weight: (!) 145.151 kg (320 lb) Most recent weight: Weight: (!) 174.635 kg (385 lb)  Exam Findings:  General: NAD, obese, lying in bed HEENT: [place]/AT CVS:  Tachycardic Resp: No labored breathing Abd: Soft, NT, ND Extrem: Warm, dry Neuro: Lethargic, arousable, orientation difficult to assess           Palliative Performance Scale: 20 %                Additional Data Reviewed: Recent Labs     07/14/14  0430  07/15/14  0349  WBC  6.7  5.7  HGB  11.2*  11.5*  PLT  291  324  NA  138  142  BUN  11  9  CREATININE  0.86  0.85     Time In: 1015 Time Out: 1130 Time Total: 34mn  Greater than 50%  of this time was spent counseling and coordinating care related to the above assessment and plan.  Discussed plan of care with Dr. RWynelle Cleveland RN, [place] [place]   Signed by:  AVinie Sill NP Palliative Medicine Team Pager # [phone](M-F 8a-5p) Team Phone # [phone](Nights/Weekends)

## 2014-07-15 NOTE — Progress Notes (Signed)
[facility] PROGRESS NOTE  John Paul NFA:[id number] DOB: [birth year] DOA: 07/11/2014 PCP: Chesley Noon, MD   Off Service Summary: John Paul is an 74 y.o. male with a PMH of hypertension, atrial fibrillation on chronic Xarelto, diabetes and dyslipidemia who was admitted 06/21/2014 by the critical care team with hypotension, lactic acidosis, and severe diarrhea. Pt was initially admitted to ICU service where his BP was ultimately stabilized with aggressive IVF hydration. Cdiff was neg as was fecal pathogen panel. The patient's diarrhea gradually improved. During this course, the patient encountered periods of afib with RVR requiring cardizem gtt. on 07/01/14, the patient was noted to have a wheeze with increased respiratory distress and recurrent fever. Empiric vancomycin/Zosyn started and pulmonology called in consultation. Due to ongoing fevers and hypotension with no obvious source of sepsis, ID consultation was requested. A CT of the chest, abdomen and pelvis were obtained to help identify a source of the patient's ongoing symptoms. CT of the chest unexpectedly showed a large mainstem pulmonary artery embolism (had been on Xarelto with no missed doses). CT of the abdomen showed nonspecific colitis, likely ischemic given the patient's hypotension. At this time, the patient remains on a heparin drip and critically ill in the ICU.  Events from 6/22-6/28: The patient continued with intermittent bouts of RVR requiring cardizem gtt. The patient had been continued on empiric rocephin for presumed UTI with urine culture later returning pseudomonas and abx was changed to zosyn. Shortly thereafter, the patient was found to have continued tachycardia and decreasing BP and was found to have a new LLL pna and vancomycin was added. Given his prolonged hospital course with slow recovery, LTAC was considered, however, would not be covered by pt's insurance. The patient's condition continued to worsen and he  was found to be in presumed septic shock not responding to IVF boluses. During this time, patient and family had expressed wish for DNR/DNI, but OK for short term pressors. Critical Care was consulted who has since evaluated the patient. Of note, the patient remains of a heparin gtt for acute PE after failing prophylactic xarelto. Case was discussed with pharmacy with recommendation to transition to coumadin if/when patient clinically improves.  The patient is now comfort care after palliative care discussion today.   Assessment/Plan: Continue comfort care measure as decided on by family today.   Hypovolemic shock/hypotension in the setting of severe diarrhea with lactic acidosis/severe dehydration rule out sepsis - Status post aggressive fluid volume resuscitation. - Empiric Flagyl was started on admission and later discontinued 06/29/14 after C. difficile studies were found to be negative. - Stool pathogen panel was found to be negative. - Given ongoing lack of improvement and concerns for occult infection, a CT of the abdomen/pelvis was obtained 07/04/14. - Suspect diarrhea related to ischemic insult to the bowel/colitis in the setting of large pulmonary embolism.   Acute respiratory distress secondary to pulmonary vascular congestion and large right mainstem pulmonary embolism/ cardiomegaly/ right heart strain / acute systolic right heart failure - On 07/01/14, the pt developed acute respiratory distress. CXR showed cardiomegaly and pulmonary congestion. Lasix given. - CT of the chest obtained 07/04/14 with an incidentally discovered large right mainstem PE. - Xarelto was discontinued and the patient was started on a heparin drip. - Per PCCM and IR, no current role for clot dissolution. - 2-D echo done 07/05/14. EF 55-60 percent, no regional wall motion abnormality but systolic function reduced in the right ventricle.   Oral thrush - Improved with nystatin.  Atrial fibrillation with RVR /  permanent atrial fibrillation on chronic anticoagulation with Xarelto - On 06/26/14, HR increased into the 130s and EKG showed atrial flutter. Patient was started on Cardizem IV. Atenolol resumed. - Was on chronic Xarelto 20 mg daily and atenolol. Atenolol was held on admission - Cardiology consultation was requested after patient developed hypotension necessitating holding atenolol. - Per cardiologist, no plans for inpatient cardioversion.  - Suspect atrial fibrillation/flutter acutely worsened rate control secondary to large PE vs infection.  - Pt had required cardizem gtt intermittently   UTI - U/A obtained 07/06/14 after heart rate elevated. Urine was cloudy with positive nitrites and moderate leukocytes, many bacteria. - Empiric Rocephin was started - Multi-drug sensitive pseudomonas was noted on urine cx    Delirium  - Received haldol PRN secondary to delirium. - Head CT w/w/o contrast was unremarkable for acute process - Suspect toxic-metabolic encephalopathy secondary to infection   Hyperlipidemia   Severe physical deconditioning   Diabetes mellitus type 2, noninsulin dependent - Hemoglobin A1c 6.3%.    Hyperkalemia / hyponatremia / hypomagnesemia / non-anion gap metabolic acidosis - Hyperkalemia, hyponatremia resolved with IV fluids. - Magnesium was replaced.   Hypocalcemia - Given 1 g of calcium gluconate 06/23/2014.   Leukocytosis / rule out sepsis - Blood cultures done 07/02/2014: Negative. - Urine cultures done 06/18/2014: Negative. - Repeat blood cultures done 07/01/14: Negative to date. - Repeat urine cultures done 07/06/14: >100,000 pseudomonas, multidrug sensitive   Obstructive sleep apnea / morbid obesity / hypoalbuminemia - BMI 49.07. Continue on heart healthy/carbohydrate modified diet. - Dietitian evaluation done 06/26/14. - Not on C Pap. Nocturnal BiPAP was started 07/01/14, however patient refused   Pressure ulcer - Pt was evaluated by wound care nurse,  continue silicone dressing over sacral area and elbow. - Continue bariatric mattress and bilateral pressure redistribution boots.   Normocytic anemia - Likely anemia of chronic disease. No current indication for transfusion.   Rule out polyarticular arthritis/inflammatory bowel disease  - ESR 130, CK low at 20. LDH normal at 130. - RF elevated at 14.8. The low positive predictive value of the RF casts doubt on the utility of the RF in the diagnostic evaluation of patients. Arch Intern Med. 1992 Dec;152(12):2417-20. - ANA negative. Acute hepatitis panel negative. Anti-double-stranded DNA antibody negative. - Sjogren's syndrome antibodies negative. ANCA negative. - CCP pending - cryoglobulins NEG - SPEP elevated at 130, but can be seen in acute infection.   DVT Prophylaxis - Heparin gtt for PE  HCAP with sepsis and septic shock - Over the past several days, pt noted with increased respirations, tachycardia, mental status change - Obtained CXR on 6/27 with findings of new LLL opacity concerning for PNA with small L pleural effusion   Code Status: DNR/DNI Family Communication:  Disposition Plan: [facility] if patient survives   Consultants:  ID  PCCM  IR  Procedures:    Antibiotics:  Vancomycin 07/05/2014---> 06/26/14, restarted 07/01/14---> 07/03/14, restarted 6/27>>>  Zosyn 07/04/2014---> 06/26/14, restarted 07/01/14---> 07/03/14, restarted 6/22>>>  Flagyl 07/02/2014---> 06/29/14  Rocephin 07/07/14--->6/22  HPI/Subjective: Sleeping   Objective: Filed Vitals:   07/15/14 1100 07/15/14 1240 07/15/14 1500 07/15/14 1530  BP: 108/68  85/39 96/50  Pulse: 118  118   Temp:  97.4 F (36.3 C) 97.6 F (36.4 C)   TempSrc:  Axillary Axillary   Resp: 22  20   Height:      Weight:      SpO2: 100%  100%  Intake/Output Summary (Last 24 hours) at 07/15/14 1814 Last data filed at 07/15/14 1233  Gross per 24 hour  Intake    850 ml  Output    725 ml  Net    125 ml   Filed  Weights   07/13/14 0500 07/14/14 0530 07/15/14 0500  Weight: 162.841 kg (359 lb) 167.377 kg (369 lb) 174.635 kg (385 lb)    Exam:   General:  Sleeping   Cardiovascular: regular, tachycardic, s1, s2  Respiratory: normal resp effort, no wheezing  Abdomen: soft, nondistended, perfused, nontender  Musculoskeletal: perfused, no clubbing  Data Reviewed: Basic Metabolic Panel:  Recent Labs Lab 07/09/14 0400  07/11/14 0545 07/12/14 0445 07/13/14 0500 07/14/14 0430 07/15/14 0349  NA 138  < > 140 141 139 138 142  K 4.2  < > 3.7 3.7 3.8 4.0 3.8  CL 104  < > 104 105 104 106 111  CO2 26  < > $R'29 29 27 25 26  'Pe$ GLUCOSE 119*  < > 123* 135* 107* 116* 181*  BUN 14  < > $R'11 11 10 11 9  'AC$ CREATININE 0.75  < > 0.67 0.72 0.73 0.86 0.85  CALCIUM 7.7*  < > 7.6* 7.7* 7.4* 7.1* 7.4*  MG 1.4*  --  1.7  --   --   --   --   < > = values in this interval not displayed. Liver Function Tests:  Recent Labs Lab 07/13/14 0500  AST 17  ALT 13*  ALKPHOS 74  BILITOT 0.4  PROT 4.7*  ALBUMIN 1.6*   No results for input(s): LIPASE, AMYLASE in the last 168 hours. No results for input(s): AMMONIA in the last 168 hours. CBC:  Recent Labs Lab 07/11/14 0545 07/12/14 0445 07/13/14 0500 07/14/14 0430 07/15/14 0349  WBC 7.7 6.0 7.1 6.7 5.7  HGB 11.0* 10.8* 10.9* 11.2* 11.5*  HCT 35.1* 34.4* 34.8* 36.4* 36.6*  MCV 98.6 98.6 97.5 97.6 99.7  PLT 395 358 355 291 324   Cardiac Enzymes: No results for input(s): CKTOTAL, CKMB, CKMBINDEX, TROPONINI in the last 168 hours. BNP (last 3 results)  Recent Labs  06/18/2014 1546  BNP 92.8    ProBNP (last 3 results) No results for input(s): PROBNP in the last 8760 hours.  CBG:  Recent Labs Lab 07/14/14 1126 07/14/14 1540 07/14/14 1700 07/14/14 2108 07/15/14 0803  GLUCAP 139* 109* 105* 173* 130*    Recent Results (from the past 240 hour(s))  Culture, Urine     Status: None   Collection Time: 07/06/14 11:20 PM  Result Value Ref Range Status    Specimen Description URINE, RANDOM  Final   Special Requests NONE  Final   Culture   Final    >=100,000 COLONIES/mL PSEUDOMONAS AERUGINOSA Performed at [facility]    Report Status 07/09/2014 FINAL  Final   Organism ID, Bacteria PSEUDOMONAS AERUGINOSA  Final      Susceptibility   Pseudomonas aeruginosa - MIC*    CEFTAZIDIME 4 SENSITIVE Sensitive     CIPROFLOXACIN <=0.25 SENSITIVE Sensitive     GENTAMICIN 4 INTERMEDIATE Intermediate     IMIPENEM 2 SENSITIVE Sensitive     PIP/TAZO 8 SENSITIVE Sensitive     CEFEPIME 4 SENSITIVE Sensitive     * >=100,000 COLONIES/mL PSEUDOMONAS AERUGINOSA  Culture, blood (routine x 2)     Status: None (Preliminary result)   Collection Time: 07/13/14  9:35 AM  Result Value Ref Range Status   Specimen Description BLOOD LEFT HAND  Final  Special Requests BOTTLES DRAWN AEROBIC ONLY 8CC  Final   Culture   Final    NO GROWTH 2 DAYS Performed at [facility]    Report Status PENDING  Incomplete  Culture, blood (routine x 2)     Status: None (Preliminary result)   Collection Time: 07/13/14  9:40 AM  Result Value Ref Range Status   Specimen Description BLOOD RIGHT ARM  Final   Special Requests BOTTLES DRAWN AEROBIC AND ANAEROBIC 10CC  Final   Culture   Final    NO GROWTH 2 DAYS Performed at [facility]    Report Status PENDING  Incomplete     Studies: Dg Chest Port 1 View  07/15/2014   CLINICAL DATA:  Lung infiltrate.  EXAM: PORTABLE CHEST - 1 VIEW  COMPARISON:  07/14/2014  FINDINGS: Mild cardiomegaly. Bilateral perihilar and lower lobe opacities have increased since prior study, likely edema. Suspect layering effusions. No acute bony abnormality.  IMPRESSION: Increasing bilateral perihilar and lower lobe opacities, likely edema.  Small layering effusions.   Electronically Signed   By: Rolm Baptise M.D.   On: 07/15/2014 07:26   Dg Chest Port 1 View  07/14/2014   CLINICAL DATA:  Shortness breath. Wheezing. Respiratory distress.  Morbid obesity.  EXAM: PORTABLE CHEST - 1 VIEW  COMPARISON:  07/13/2014 and 07/01/2014  FINDINGS: There is new linear atelectasis in the right midzone. Persistent slight atelectasis at the left base. Persistent small bilateral pleural effusions. Persistent slight pulmonary vascular congestion.  IMPRESSION: Slight new atelectasis in the right midzone. Persistent left base atelectasis and small bilateral effusions.   Electronically Signed   By: Lorriane Shire M.D.   On: 07/14/2014 19:04    Scheduled Meds: . antiseptic oral rinse  7 mL Mouth Rinse q12n4p  . chlorhexidine  15 mL Mouth Rinse BID  . feeding supplement (GLUCERNA SHAKE)  237 mL Oral TID BM  . gabapentin  100 mg Oral BID  . nystatin   Topical TID  . sodium chloride  10-40 mL Intracatheter Q12H   Continuous Infusions: . sodium chloride Stopped (07/12/14 0600)       [facility]  [facility] Pager [phone] If 7PM-7AM, please contact night-coverage at [email or web address], password [facility] 07/15/2014, 6:14 PM  LOS: 20 days

## 2014-07-15 NOTE — Progress Notes (Signed)
CSW assisting with d/c planning. Palliative Care has recommended residential hospice home placement. CSW has met with family at pt's bedside. Pt / family are requesting [employer] [facility]. Referral to [facility] has been made and a decision is pending.  Werner Lean LCSW [phone]

## 2014-07-15 NOTE — Progress Notes (Signed)
PULMONARY / CRITICAL CARE MEDICINE   Name: John Paul MRN: [id number] DOB: 04-08-1940    ADMISSION DATE:  06/17/2014  CHIEF COMPLAINT:  Diarrhea   INITIAL PRESENTATION:  74 yo male admitted with diarrhea and hypotension.  Hospital course complicated by A fib with RVR, PE.  PCCM reconsulted 6/28 for hypotension and ? Of sepsis.  STUDIES:  6/18 CT chest >> PA 4.2 cm, PE Rt main PA, RV:LV ratio 1.3, small b/l effusions 6/18 CT abd/pelvis >> GB stones/sludge, b/l renal calculi, umbilical hernia 6/19 Echo >> EF 55 to 60%, mild MR, mild/mod RV systolic dysfx  SIGNIFICANT EVENTS: 6/09 admitted after 6 days diarrhea with decreased PO.  6/10 [facility] assumed care. 6/11 A.fib with RVR > started on cardizem gtt. 6/13 back in RVR after cardizem stopped; therefore cardizem resumed for 24 hrs 6/15 PCCM called for respiratory distress. Placed back on vanc / zosyn and re-cultured for concerns of sepsis. 6[facility] 6/28 PCCM called back for concern for hypotension / possible sepsis >> started solucortef, and weaned off pressors  SUBJECTIVE:  Was on pressors yesterday >> weaned off after starting solu cortef.  Refused BiPAP overnight.  VITAL SIGNS: Temp:  [98.1 F (36.7 C)-99.2 F (37.3 C)] 98.1 F (36.7 C) (06/29 0730) Pulse Rate:  [107-124] 119 (06/29 0800) Resp:  [15-31] 19 (06/29 0800) BP: (71-119)/(36-90) 115/67 mmHg (06/29 0800) SpO2:  [87 %-100 %] 97 % (06/29 0800) Weight:  [385 lb (174.635 kg)] 385 lb (174.635 kg) (06/29 0500)  INTAKE / OUTPUT:  Intake/Output Summary (Last 24 hours) at 07/15/14 0837 Last data filed at 07/15/14 0600  Gross per 24 hour  Intake 3437.5 ml  Output   1125 ml  Net 2312.5 ml    PHYSICAL EXAMINATION: General: somnolent Neuro: wakes up with brisk stimulation, and follows commands HEENT: MP 4 airway Cardiovascular: regular, tachyardic Lungs: diminished breath sounds, no wheeze Abdomen: soft, non tender Musculoskeletal: 1+ edema   Skin: no rashes  LABS:  CBC  Recent Labs Lab 07/13/14 0500 07/14/14 0430 07/15/14 0349  WBC 7.1 6.7 5.7  HGB 10.9* 11.2* 11.5*  HCT 34.8* 36.4* 36.6*  PLT 355 291 324   Coag's  Recent Labs Lab 07/08/14 0855 07/09/14 0400  APTT 70* 58*   BMET  Recent Labs Lab 07/13/14 0500 07/14/14 0430 07/15/14 0349  NA 139 138 142  K 3.8 4.0 3.8  CL 104 106 111  CO2 27 25 26   BUN 10 11 9   CREATININE 0.73 0.86 0.85  GLUCOSE 107* 116* 181*   Electrolytes  Recent Labs Lab 07/09/14 0400  07/11/14 0545  07/13/14 0500 07/14/14 0430 07/15/14 0349  CALCIUM 7.7*  < > 7.6*  < > 7.4* 7.1* 7.4*  MG 1.4*  --  1.7  --   --   --   --   < > = values in this interval not displayed.   Sepsis Markers  Recent Labs Lab 07/14/14 0748 07/14/14 1026 07/14/14 1459 07/15/14 0349  LATICACIDVEN 1.0 0.9  --   --   PROCALCITON  --   --  0.16 0.19   ABG  Recent Labs Lab 07/13/14 0930  PHART 7.416  PCO2ART 37.2  PO2ART 60.1*   Liver Enzymes  Recent Labs Lab 07/13/14 0500  AST 17  ALT 13*  ALKPHOS 74  BILITOT 0.4  ALBUMIN 1.6*   Glucose  Recent Labs Lab 07/14/14 0747 07/14/14 1126 07/14/14 1540 07/14/14 1700 07/14/14 2108 07/15/14 0803  GLUCAP 114* 139*  109* 105* 173* 130*    Imaging Dg Chest Port 1 View  07/15/2014   CLINICAL DATA:  Lung infiltrate.  EXAM: PORTABLE CHEST - 1 VIEW  COMPARISON:  07/14/2014  FINDINGS: Mild cardiomegaly. Bilateral perihilar and lower lobe opacities have increased since prior study, likely edema. Suspect layering effusions. No acute bony abnormality.  IMPRESSION: Increasing bilateral perihilar and lower lobe opacities, likely edema.  Small layering effusions.   Electronically Signed   By: Charlett Nose M.D.   On: 07/15/2014 07:26   Dg Chest Port 1 View  07/14/2014   CLINICAL DATA:  Shortness breath. Wheezing. Respiratory distress. Morbid obesity.  EXAM: PORTABLE CHEST - 1 VIEW  COMPARISON:  07/13/2014 and 07/01/2014  FINDINGS: There  is new linear atelectasis in the right midzone. Persistent slight atelectasis at the left base. Persistent small bilateral pleural effusions. Persistent slight pulmonary vascular congestion.  IMPRESSION: Slight new atelectasis in the right midzone. Persistent left base atelectasis and small bilateral effusions.   Electronically Signed   By: Francene Boyers M.D.   On: 07/14/2014 19:04    DISCUSSION: Concern has been for sepsis.  I think his CXR is more consistent with pulmonary edema and ATX rather than HCAP.  Also ?accuracy of BP cuff readings given his body habitus.  He had low cortisol (14.2 from 6/28) >> blood pressure better, and off pressors since starting solu cortef.  He also likely has RV failure in setting of PE, OSA/OHS with morbid obesity, and acute on chronic hypoxic respiratory failure.  PLAN:  Hypotension. Plan: - continue solucortef - even fluid balance - monitor hemodynamics  ?PNA and sepsis >> doubt this. Pseudomonal UTI from urine cx 6/20. 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 Plan: - continue supplemental oxygen with goal SpO2 88 to 95% - CPAP qhs and prn during day >> had extensive discussion with pt and his wife about importance of complying with CPAP therapy, even if only with partial compliance  Acute PE. Plan: - heparin gtt per primary team  Morbid obesity. Plan: - f/u with nutrition to assist with weight loss recommendations  A fib with RVR. Plan: - per primary team  Goals of care >> DNR/DNI  Updated pt's wife at bedside.  Coralyn Helling, MD [facility] Pulmonary/Critical Care 07/15/2014, 8:54 AM Pager:  [phone] After 3pm call: [phone]

## 2014-07-15 NOTE — Progress Notes (Signed)
ANTICOAGULATION CONSULT NOTE - Follow Up Consult  Pharmacy Consult for Heparin Indication: pulmonary embolus  No Known Allergies  Patient Measurements: Height: 5\' 10"  (177.8 cm) Weight: (!) 385 lb (174.635 kg) IBW/kg (Calculated) : 73 Heparin Dosing Weight: 113 kg  Vital Signs: Temp: 99.2 F (37.3 C) (06/29 0400) Temp Source: Axillary (06/29 0400) BP: 111/71 mmHg (06/29 0400) Pulse Rate: 118 (06/29 0400)  Labs:  Recent Labs  07/13/14 0500 07/14/14 0430 07/14/14 1555 07/15/14 0035 07/15/14 0349  HGB 10.9* 11.2*  --   --  11.5*  HCT 34.8* 36.4*  --   --  36.6*  PLT 355 291  --   --  324  HEPARINUNFRC  --  0.84* 0.51 0.66 0.76*  CREATININE 0.73 0.86  --   --  0.85    Estimated Creatinine Clearance: 128.1 mL/min (by C-G formula based on Cr of 0.85).   Medications:  Scheduled:  . antiseptic oral rinse  7 mL Mouth Rinse q12n4p  . atorvastatin  10 mg Oral QHS  . chlorhexidine  15 mL Mouth Rinse BID  . feeding supplement (GLUCERNA SHAKE)  237 mL Oral TID BM  . gabapentin  100 mg Oral BID  . hydrocortisone sod succinate (SOLU-CORTEF) inj  50 mg Intravenous Q6H  . insulin aspart  0-20 Units Subcutaneous TID WC  . insulin aspart  0-5 Units Subcutaneous QHS  . nystatin   Topical TID  . piperacillin-tazobactam (ZOSYN)  IV  3.375 g Intravenous 3 times per day  . sodium chloride  10-40 mL Intracatheter Q12H  . vancomycin  1,500 mg Intravenous Q12H   Infusions:  . sodium chloride Stopped (07/12/14 0600)  . sodium chloride Stopped (07/14/14 1821)  . diltiazem (CARDIZEM) infusion 5 mg/hr (07/09/14 1600)  . heparin 2,000 Units/hr (07/15/14 0330)  . phenylephrine (NEO-SYNEPHRINE) Adult infusion 20 mcg/min (07/15/14 0113)    Assessment: John Paul admitted 6/9 with diarrhea, SOB, productive cough, and fall at home.  EMS was required to assist patient after falling.  PMH includes morbid obesity, DM, HLP, HTN, OSA, and Afib on Xarelto.  Xarelto was resumed on admission.  He was  incidentally found to have new PE with right heart strain on 6/18.  Pharmacy was consulted to transition from Xarelto to Heparin.  Today, 07/15/2014:  Heparin level: 0.76, supratherapeutic  CBC: Hgb 11.5 (low/stable), Plt 324  No bleeding reported or documented; no infusion interruptions or complications per RN  Renal: SCr 0.85, CrCl > 100 ml/min   Goal of Therapy:  Heparin level 0.3-0.7 units/ml Monitor platelets by anticoagulation protocol: Yes   Plan:   decrease heparin IV infusion to 1900 units/hr  Heparin level 8 hours after rate change  Daily heparin level and CBC  Continue to monitor H&H and platelets  F/u plan for outpatient anticoagulation for PE and afib.    Arley PhenixEllen Quashon Jesus RPh 07/15/2014, 7:23 AM Pager [phone]

## 2014-07-15 NOTE — Progress Notes (Signed)
07/15/2014 Rn notice patient right harm swollen and noted where picc at 0800 The right upper arm more than left. Rn did call iv team at 1330 to assess site. Dr Butler Denmarkizwan was made aware. Rn on 2 west  did report to the nurse that going to be taking care of the patient [place] patient arm. [facility] Wednesday Ericsson RN.

## 2014-07-15 NOTE — Progress Notes (Signed)
ANTICOAGULATION CONSULT NOTE - Follow Up Consult  Pharmacy Consult for Heparin Indication: pulmonary embolus  No Known Allergies  Patient Measurements: Height: 5\' 10"  (177.8 cm) Weight: (!) 369 lb (167.377 kg) IBW/kg (Calculated) : 73 Heparin Dosing Weight:   Vital Signs: Temp: 99.1 F (37.3 C) (06/29 0000) Temp Source: Axillary (06/29 0000) BP: 110/65 mmHg (06/29 0000) Pulse Rate: 119 (06/29 0000)  Labs:  Recent Labs  07/12/14 0445  07/13/14 0500 07/14/14 0430 07/14/14 1555 07/15/14 0035  HGB 10.8*  --  10.9* 11.2*  --   --   HCT 34.4*  --  34.8* 36.4*  --   --   PLT 358  --  355 291  --   --   HEPARINUNFRC 0.29*  < >  --  0.84* 0.51 0.66  CREATININE 0.72  --  0.73 0.86  --   --   < > = values in this interval not displayed.  Estimated Creatinine Clearance: 123.5 mL/min (by C-G formula based on Cr of 0.86).   Medications:  Infusions:  . sodium chloride Stopped (07/12/14 0600)  . sodium chloride Stopped (07/14/14 1821)  . diltiazem (CARDIZEM) infusion 5 mg/hr (07/09/14 1600)  . heparin 2,000 Units/hr (07/14/14 1800)  . phenylephrine (NEO-SYNEPHRINE) Adult infusion 20 mcg/min (07/15/14 0113)    Assessment: Patient with heparin level at goal.  No heparin issues noted.  Goal of Therapy:  Heparin level 0.3-0.7 units/ml Monitor platelets by anticoagulation protocol: Yes   Plan:  Continue heparin drip at current rate Recheck level with AM labs  Darlina GuysGrimsley Jr, Jacquenette ShoneJulian Crowford 07/15/2014,2:38 AM

## 2014-07-17 NOTE — Progress Notes (Signed)
RN called to evaluate pt, found pt with no heart rate or respirations. A second nurse called to confirm that pt expired at 0740. Pt's wife and family at bed side, [facility] and [facility] notified.

## 2014-07-17 NOTE — Discharge Summary (Signed)
Death Summary  John Paul FBP:[id number] DOB: 03/30/1940 DOA: 2014/07/17  PCP: Chesley Noon, MD   Admit date: 2014/07/17 Date of Death: 08-07-14  Final Diagnoses:  Principal Problem:   Hypotension Active Problems:   Atrial fibrillation with RVR   Morbid obesity   Hyperlipidemia   Diabetes mellitus type 2, noninsulin dependent   Acute kidney injury   Diarrhea   Dehydration, severe   Hyperkalemia   Hyponatremia   Lactic acidosis   Chronic anticoagulation-Xarelto   Permanent atrial fibrillation   Leukocytosis   Pulmonary vascular congestion   Sepsis   Pressure ulcer   Hypoalbuminemia   Normocytic anemia   Acute renal failure syndrome   Coagulopathy   FUO (fever of unknown origin)   IBD (inflammatory bowel disease)   Other noninfectious gastroenteritis   Myalgia and myositis   Polyarticular arthritis   Acute pulmonary embolism with right heart strain   Acute right-sided heart failure   Acute delirium   Sepsis due to pneumonia   HCAP (healthcare-associated pneumonia)   Shortness of breath   Palliative care encounter  THE PATIENT EXPIRED ON August 07, 2014 AT 7:40 AM  Mccauley Diehl is an 74 y.o. male with a PMH of hypertension, atrial fibrillation on chronic Xarelto, diabetes and dyslipidemia who was admitted July 17, 2014 by the critical care team with hypotension, lactic acidosis, and severe diarrhea. Pt was initially admitted to ICU service where his BP was ultimately stabilized with aggressive IVF hydration. Cdiff was neg as was fecal pathogen panel. The patient's diarrhea gradually improved. During this course, the patient encountered periods of afib with RVR requiring cardizem gtt. on 07/01/14, the patient was noted to have a wheeze with increased respiratory distress and recurrent fever. Empiric vancomycin/Zosyn started and pulmonology called in consultation. Due to ongoing fevers and hypotension with no obvious source of sepsis, ID consultation was requested. A CT of the  chest, abdomen and pelvis were obtained to help identify a source of the patient's ongoing symptoms. CT of the chest unexpectedly showed a large mainstem pulmonary artery embolism (had been on Xarelto with no missed doses). CT of the abdomen showed nonspecific colitis, likely ischemic given the patient's hypotension. At this time, the patient remains on a heparin drip and critically ill in the ICU.  Events from 6/22-6/28: The patient continued with intermittent bouts of RVR requiring cardizem gtt. The patient had been continued on empiric rocephin for presumed UTI with urine culture later returning pseudomonas and abx was changed to zosyn. Shortly thereafter, the patient was found to have continued tachycardia and decreasing BP and was found to have a new LLL pna and vancomycin was added. Given his prolonged hospital course with slow recovery, LTAC was considered, however, would not be covered by pt's insurance. The patient's condition continued to worsen and he was found to be in shock not responding to IVF boluses. During this time, patient and family had expressed wish for DNR/DNI, but OK for short term pressors. Critical Care was consulted. Of note, the patient remains of a heparin gtt for acute PE after failing prophylactic xarelto.    Assessment/Plan:   Hypovolemic shock/hypotension in the setting of severe diarrhea with lactic acidosis/severe dehydration rule out sepsis - Status post aggressive fluid volume resuscitation. - Empiric Flagyl was started on admission and later discontinued 06/29/14 after C. difficile studies were found to be negative. - Stool pathogen panel was found to be negative. - Given ongoing lack of improvement and concerns for occult infection, a CT of the abdomen/pelvis was obtained  07/04/14. - Suspect diarrhea related to ischemic insult to the bowel/colitis in the setting of large pulmonary embolism.   Acute respiratory distress secondary to pulmonary vascular congestion  and large right mainstem pulmonary embolism/ cardiomegaly/ right heart strain / acute systolic right heart failure - On 07/01/14, the pt developed acute respiratory distress. CXR showed cardiomegaly and pulmonary congestion. Lasix given. - CT of the chest obtained 07/04/14 with an incidentally discovered large right mainstem PE. - Xarelto was discontinued and the patient was started on a heparin drip. - Per PCCM and IR, no current role for clot dissolution. - 2-D echo done 07/05/14. EF 55-60 percent, no regional wall motion abnormality but systolic function reduced in the right ventricle.   Oral thrush - Improved with nystatin.   Atrial fibrillation with RVR / permanent atrial fibrillation on chronic anticoagulation with Xarelto - On 06/26/14, HR increased into the 130s and EKG showed atrial flutter. Patient was started on Cardizem IV. Atenolol resumed. - Was on chronic Xarelto 20 mg daily and atenolol. Atenolol was held on admission - Cardiology consultation was requested after patient developed hypotension necessitating holding atenolol. - Per cardiologist, no plans for inpatient cardioversion.  - Suspect atrial fibrillation/flutter acutely worsened rate control secondary to large PE vs infection.  - Pt had required cardizem gtt intermittently   UTI - U/A obtained 07/06/14 after heart rate elevated. Urine was cloudy with positive nitrites and moderate leukocytes, many bacteria. - Empiric Rocephin was started - Multi-drug sensitive pseudomonas was noted on urine cx    Delirium  - Received haldol PRN secondary to delirium. - Head CT w/w/o contrast was unremarkable for acute process - Suspect toxic-metabolic encephalopathy secondary to infection   Hyperlipidemia   Severe physical deconditioning   Diabetes mellitus type 2, noninsulin dependent - Hemoglobin A1c 6.3%.    Hyperkalemia / hyponatremia / hypomagnesemia / non-anion gap metabolic acidosis - Hyperkalemia, hyponatremia  resolved with IV fluids. - Magnesium was replaced.   Hypocalcemia - Given 1 g of calcium gluconate 06/24/2014.   Leukocytosis / rule out sepsis - Blood cultures done 06/19/2014: Negative. - Urine cultures done 07/01/2014: Negative. - Repeat blood cultures done 07/01/14: Negative to date. - Repeat urine cultures done 07/06/14: >100,000 pseudomonas, multidrug sensitive   Obstructive sleep apnea / morbid obesity / hypoalbuminemia - BMI 49.07. Continue on heart healthy/carbohydrate modified diet. - Dietitian evaluation done 06/26/14. - Not on C Pap. Nocturnal BiPAP was started 07/01/14, however patient refused   Pressure ulcer - Pt was evaluated by wound care nurse, continue silicone dressing over sacral area and elbow. - Continue bariatric mattress and bilateral pressure redistribution boots.   Normocytic anemia - Likely anemia of chronic disease. No current indication for transfusion.   Rule out polyarticular arthritis/inflammatory bowel disease  - ESR 130, CK low at 20. LDH normal at 130. - RF elevated at 14.8. The low positive predictive value of the RF casts doubt on the utility of the RF in the diagnostic evaluation of patients. Arch Intern Med. 1992 Dec;152(12):2417-20. - ANA negative. Acute hepatitis panel negative. Anti-double-stranded DNA antibody negative. - Sjogren's syndrome antibodies negative. ANCA negative. - CCP pending - cryoglobulins NEG - SPEP elevated at 130, but can be seen in acute infection.   DVT Prophylaxis - Heparin gtt for PE  HCAP with sepsis and septic shock - Over the past several days, pt noted with increased respirations, tachycardia, mental status change - Obtained CXR on 6/27 with findings of new LLL opacity concerning for PNA with small L  pleural effusion    Signed:  [facility]  [facility] 08/07/14, 8:04 AM

## 2014-07-17 NOTE — Progress Notes (Signed)
PCCM NOTE  Pt transition to hospice care.  PCCM will sign off.  Please call if additional help needed.  Coralyn HellingVineet Mccauley Diehl, MD [facility] Pulmonary/Critical [facility] 06/21/2014, 7:28 AM Pager:  [phone] After 3pm call: [phone]

## 2014-07-17 NOTE — Care Management Note (Signed)
Case Management Note  Patient Details  Name: Curlene LabrumFrank Mccollom MRN: [id number] Date of Birth: [birth year]  Subjective/Objective: Expired.                   Action/Plan:   Expected Discharge Date:   (unknown)               Expected Discharge Plan:  Expired  In-House Referral:  NA  Discharge planning Services  CM Consult  Post Acute Care Choice:  NA Choice offered to:  NA  DME Arranged:  N/A DME Agency:  NA  HH Arranged:  NA HH Agency:  NA  Status of Service:  Completed, signed off  Medicare Important Message Given:  Yes-second notification given Date Medicare IM Given:    Medicare IM give by:    Date Additional Medicare IM Given:    Additional Medicare Important Message give by:     If discussed at Long Length of Stay Meetings, dates discussed:    Additional Comments:  Lanier ClamMahabir, Makih Stefanko, RN 06/24/2014, 10:15 AM

## 2014-07-17 DEATH — deceased

## 2014-07-18 LAB — CULTURE, BLOOD (ROUTINE X 2)
Culture: NO GROWTH
Culture: NO GROWTH

## 2014-07-21 ENCOUNTER — Telehealth: Payer: Self-pay | Admitting: Cardiology

## 2014-07-21 NOTE — Telephone Encounter (Signed)
She just wanted you and Dr Swazil[place]Jordan know he passed away on 2014-01-31. She wants to make sure that Dr Sw[place]Jordan knows she appreciated everything [facility] did [facility].

## 2014-07-21 NOTE — Telephone Encounter (Signed)
Returned call to patient's wife sympathy expressed.Will let Dr.Jordan know.

## 2017-04-18 IMAGING — DX DG CHEST 1V PORT
1 series · 1 of 1 positions shown · non-contrast
Comparison: 07/14/2014

CLINICAL DATA: Lung infiltrate.

EXAM:
PORTABLE CHEST - 1 VIEW

[chest ap]
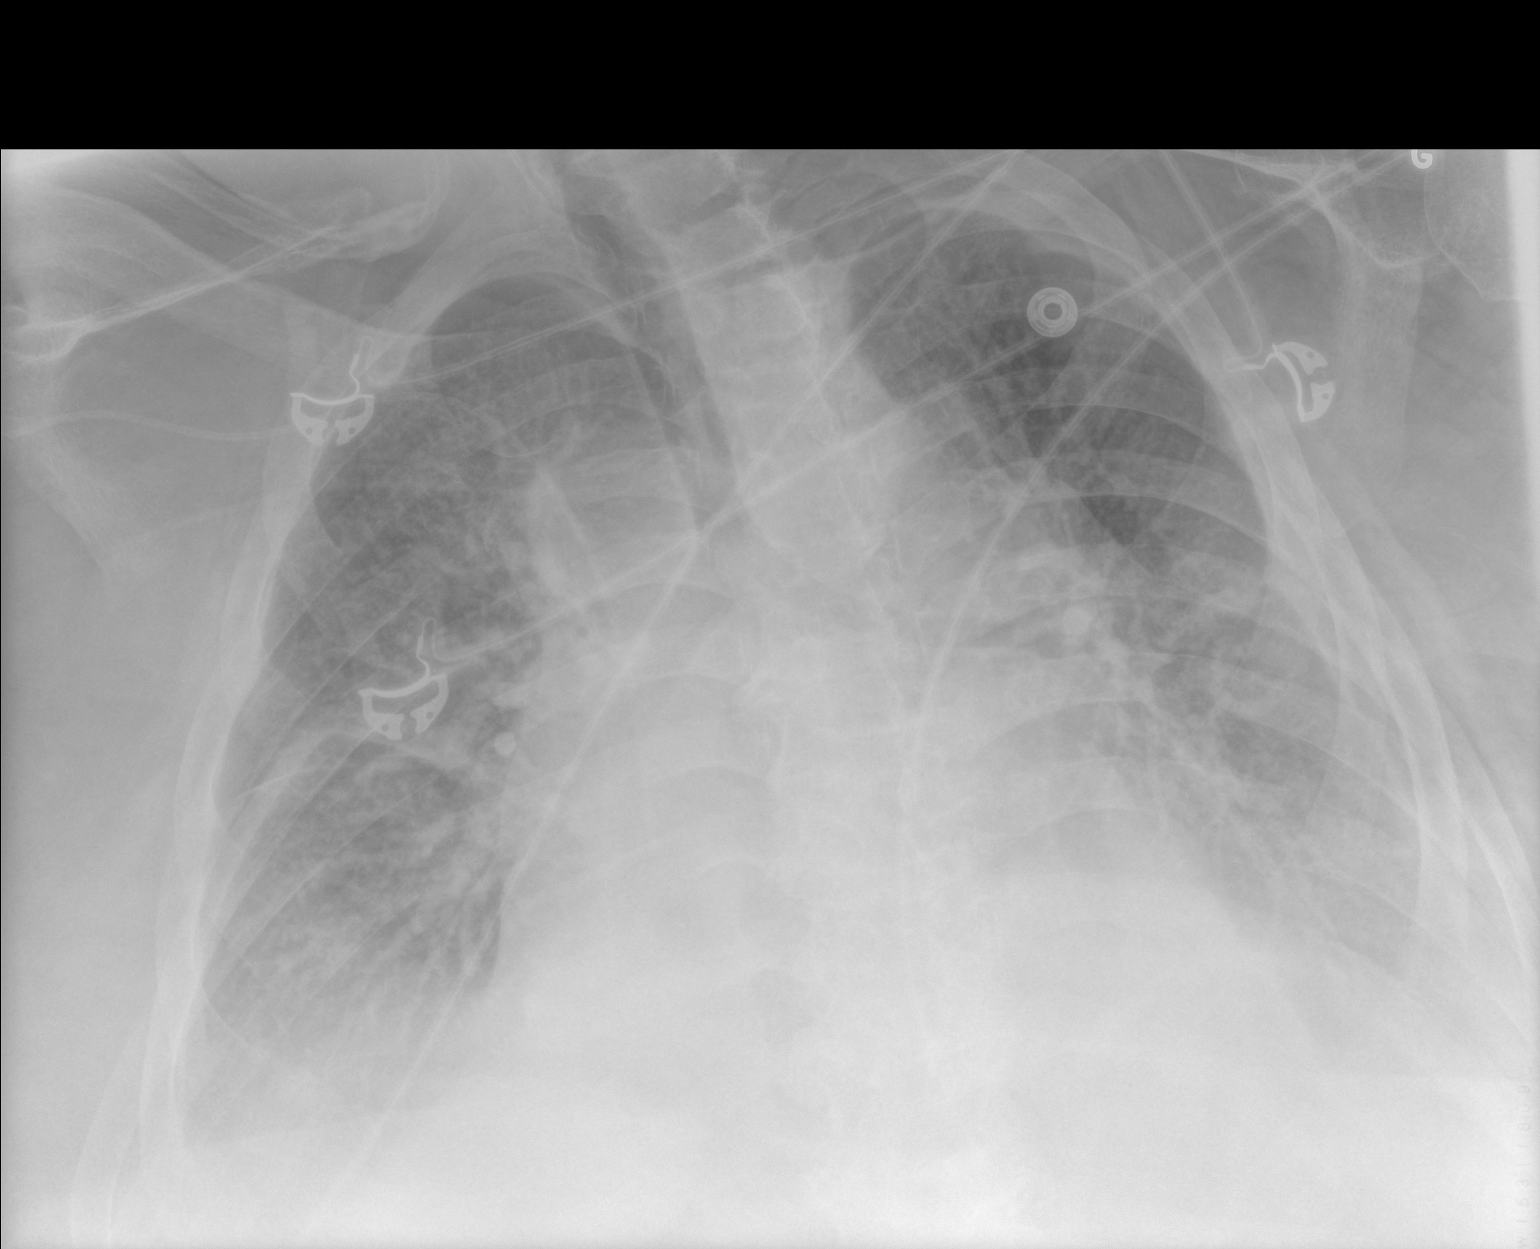

[1 of 1 positions shown; findings below may reference images not displayed]

FINDINGS: Mild cardiomegaly. Bilateral perihilar and lower lobe opacities have
increased since prior study, likely edema. Suspect layering
effusions. No acute bony abnormality.
IMPRESSION: Increasing bilateral perihilar and lower lobe opacities, likely
edema.

Small layering effusions.
